# Patient Record
Sex: Male | Born: 1965 | Race: Black or African American | Hispanic: No | Marital: Married | Smoking: Never smoker
Health system: Southern US, Community
[De-identification: ages and names within clinical notes are randomized; demographics above are authoritative.]

## PROBLEM LIST (undated history)

## (undated) DIAGNOSIS — K219 Gastro-esophageal reflux disease without esophagitis: Secondary | ICD-10-CM

## (undated) DIAGNOSIS — I10 Essential (primary) hypertension: Secondary | ICD-10-CM

## (undated) DIAGNOSIS — E119 Type 2 diabetes mellitus without complications: Secondary | ICD-10-CM

## (undated) DIAGNOSIS — M1990 Unspecified osteoarthritis, unspecified site: Secondary | ICD-10-CM

## (undated) HISTORY — PX: CYST EXCISION: SHX5701

## (undated) HISTORY — PX: CHOLECYSTECTOMY: SHX55

## (undated) SURGERY — Surgical Case
Anesthesia: *Unknown

---

## 2005-11-27 ENCOUNTER — Encounter: Payer: Self-pay | Admitting: Internal Medicine

## 2005-11-27 ENCOUNTER — Ambulatory Visit: Payer: Self-pay | Admitting: Internal Medicine

## 2005-12-04 ENCOUNTER — Ambulatory Visit (HOSPITAL_COMMUNITY): Admission: RE | Admit: 2005-12-04 | Discharge: 2005-12-04 | Payer: Self-pay | Admitting: Internal Medicine

## 2005-12-04 ENCOUNTER — Ambulatory Visit: Payer: Self-pay | Admitting: Internal Medicine

## 2008-05-16 ENCOUNTER — Emergency Department (HOSPITAL_COMMUNITY): Admission: EM | Admit: 2008-05-16 | Discharge: 2008-05-16 | Payer: Self-pay | Admitting: Emergency Medicine

## 2008-08-19 ENCOUNTER — Encounter (INDEPENDENT_AMBULATORY_CARE_PROVIDER_SITE_OTHER): Payer: Self-pay | Admitting: General Surgery

## 2008-08-19 ENCOUNTER — Ambulatory Visit (HOSPITAL_COMMUNITY): Admission: RE | Admit: 2008-08-19 | Discharge: 2008-08-19 | Payer: Self-pay | Admitting: General Surgery

## 2011-02-19 NOTE — Op Note (Signed)
[name], Francisco Fox               ACCOUNT NO.:  [id number]   MEDICAL RECORD NO.:  [id number]          PATIENT TYPE:  AMB   LOCATION:  DAY                           FACILITY:  [facility]   PHYSICIAN:  Tilford Pillar, MD      DATE OF BIRTH:  05/10/1966   DATE OF PROCEDURE:  08/19/2008  DATE OF DISCHARGE:                               OPERATIVE REPORT   PREOPERATIVE DIAGNOSIS:  Right preauricular sebaceous cyst of the face.   POSTOPERATIVE DIAGNOSIS:  Right preauricular sebaceous cyst of the face.   PROCEDURE:  Excision of sebaceous cyst of the face via 1-cm incision.   SURGEON:  Tilford Pillar, MD   ANESTHESIA:  MAC anesthesia with local anesthetic 1% lidocaine with  epinephrine.   SPECIMEN:  Cyst.   ESTIMATED BLOOD LOSS:  Minimal.   INDICATIONS:  The patient is a 45 year old male who presented to my  office with a history of a nodule just in front of his right ear.  This  had been present for several months that had slowly increased in  symptomatology.  The risks, benefits, and alternatives of excision were  discussed at length with the patient.  The patient's questions and  concerns were addressed, and the patient was consented for the planned  procedure.   OPERATION:  The patient was taken to the operating room and placed in a  supine position on the operating room table at which time the sedation  was administered.  Once the patient was asleep, his right face was  prepped with DuraPrep solution.  Sterile drapes were placed.  Local  anesthetic was then instilled along the planned site of excision.  An  elliptical incision was created over the cystic nodule with a #15-blade  scalpel.  Additional dissection down through the subcuticular tissue was  carried out using electrocautery.  Needle-tipped electrocautery was  utilized to complete the dissection.  Once the cyst was freed, it was  placed on the back table and was sent as a permanent specimen to  pathology.  At this time,  hemostasis was excellent.  The wound was  irrigated.  A 4-0 Monocryl was utilized to reapproximate the skin edges  in a running subcuticular suture.  The skin was washed and dried with a  moist and dry towel.  Quarter inch Steri-Strips were placed after  benzoin was applied around the incision.  DuraPrep remover was then used  to clear the remaining skin.  The patient was allowed to come out of  sedation after the drapes  were removed.  The patient was transferred to the post anesthesia  recovery area in stable condition.  At the conclusion of procedure, all  instrument, sponge, and needle counts were correct.  The patient  tolerated procedure well.      Tilford Pillar, MD  Electronically Signed     BZ/MEDQ  D:  08/19/2008  T:  08/20/2008  Job:  [id number]   cc:   Dr. Phillips Odor

## 2011-02-19 NOTE — H&P (Signed)
Francisco Fox, Francisco Fox               ACCOUNT NO.:  [id number]   MEDICAL RECORD NO.:  [id number]          PATIENT TYPE:  AMB   LOCATION:  DAY                           FACILITY:  [facility]   PHYSICIAN:  Tilford Pillar, MD      DATE OF BIRTH:  15-Feb-1966   DATE OF ADMISSION:  DATE OF DISCHARGE:  LH                              HISTORY & PHYSICAL   CHIEF COMPLAINT:  Nodule on right face.   HISTORY OF PRESENT ILLNESS:  The patient is a 45 year old male who  presented to my office with approximately 1 week of increasing pain  along the right aspect of his face at the right temporal area.  He  states he had noted a nodule there over the last several years without  any significant problems.  Really, no significant change in size.  It  never caused him any problems until over the last couple of weeks.  He  noticed that increasing in size and having an increasing pain associated  with it.  He was started on some antibiotics.  He described as purulent  drainage from the area.  He denied any fever or chills.  No nausea and  vomiting.  Since initiating antibiotics, it has improved.  He is  currently completed with his antibiotics.  He feels somewhat tender in  this area.   PAST MEDICAL HISTORY:  None.   PAST SURGICAL HISTORY:  Previous laparoscopic cholecystectomy.   MEDICATIONS:  None.   ALLERGIES:  No known drug allergies.   SOCIAL HISTORY:  No tobacco, no alcohol use.  Occupation, he does work  for [employer] as a Hospital doctor.   PERTINENT FAMILY HISTORY:  Consistent with diabetes mellitus and  hypertension.   REVIEW OF SYSTEMS:  In all systems is unremarkable including  constitutional, eyes, ears, nose and throat, respiratory,  cardiovascular, gastrointestinal, genitourinary, musculoskeletal, skin,  endocrine and neuro other than HPI.   PHYSICAL EXAMINATION:  GENERAL:  The patient is an age-appropriate  healthy-appearing, obese male in no acute distress.  He is alert and  oriented x3.  HEENT: Scalp, no deformities, no masses.  Eyes: Pupils equal, round, and  reactive.  Extraocular movements were intact.  No scleral icterus or  conjunctival pallor is noted.  No diminished hearing is appreciated.  On  exam, oral mucosa is pink.  Normal occlusion.  NECK:  Trachea is  midline.  No cervical lymphadenopathy is apparent.  On palpation of the  face, he does have a palpable nodule on the right temporale area just  anterior to the ear on the right side.  This is slightly tender to  palpation.  It is mobile.  There is no discharge noted.  There is no  fluctuance associated with it.  PULMONARY:  Unlabored respirations.  He is clear to auscultation  bilaterally.  CARDIOVASCULAR:  Regular rate and rhythm.  No murmurs are  appreciated.  He has 2+ radial pulses bilaterally.  ABDOMEN:  Soft and nontender.  SKIN:  Warm and dry.   ASSESSMENT AND PLAN:  Sebaceous cyst of the right face.  At this  time, I  discussed with the patient to continue ibuprofen and pain medication for  comfort as well as warm compresses as needed versus ice packs that helps  with the symptomatology based on the location and the size.  I did  discuss the risks, benefits, and alternatives of cyst excision with risk  including, but not limited to risk of bleeding, infection, recurrence as  well as the possibility of local paresthesia around the area.  The  patient's questions and concerns were addressed and the patient wishes  to proceed with the planned excision.  This will be scheduled at his  earliest convenience.      Tilford Pillar, MD  Electronically Signed     BZ/MEDQ  D:  08/11/2008  T:  08/12/2008  Job:  [id number]   cc:   [facility]   Dr. Phillips Odor   Short-stay surgery

## 2011-02-22 NOTE — Op Note (Signed)
Francisco Fox, Francisco Fox               ACCOUNT NO.:  [id number]   MEDICAL RECORD NO.:  [id number]          PATIENT TYPE:  AMB   LOCATION:  DAY                           FACILITY:  [facility]   PHYSICIAN:  R. Roetta Sessions, M.D. DATE OF BIRTH:  04-06-1966   DATE OF PROCEDURE:  12/04/2005  DATE OF DISCHARGE:                                 OPERATIVE REPORT   PROCEDURE:  Esophagogastroduodenoscopy with gastric biopsy followed by  colonoscopy, diagnostic.   INDICATIONS FOR PROCEDURE:  The patient is a 45 year old gentleman with  burning, epigastric pain, painless hematochezia, history of known  hemorrhoids. EGD and colonoscopy are now being done. This approach has been  discussed with the patient at length. Potential risks, benefits, and  alternatives have been reviewed and questions answered. He is agreeable. It  is notable through [facility] office, CBC from November 12, 2005 came back entirely normal as did a Chem 20. Amylase and lipase were  also not elevated.   PROCEDURE NOTE:  O2 saturation, blood pressure, pulse, and respirations were  monitored throughout the entire procedure. Conscious sedation with Versed 4  mg IV and Demerol 75 mg IV in divided doses.   INSTRUMENT:  Olympus video chip system.   FINDINGS:  Examination of the tubular esophagus revealed a couple of distal  esophageal erosions. Esophageal mucosa otherwise appeared normal. EGD  junction was easily traversed.   Stomach:  Gastric cavity was empty and insufflated well with air. Thorough  examination of gastric mucosa including retroflexed view of the proximal  stomach and esophagogastric junction was undertaken. There was slight  nodularity on the lesser curvature of the mucosa really more on the distal  side of the annularis into the superior aspect of the antrum. Please see  photos. They do not appear to be an infiltrating process. There was no ulcer  erosion. Pylorus patent and easily traversed.  Examination of bulb and second  portion revealed no abnormalities.   THERAPEUTIC/DIAGNOSTIC MANEUVERS:  The mucosa just distal to the angularis,  superior aspect of the antrum was biopsied for histologic study. The patient  tolerated the procedure well and was prepared for colonoscopy. Digital  rectal exam revealed a couple of external anal papilla. Otherwise negative.   ENDOSCOPIC FINDINGS:  Prep was adequate.   Rectum:  Examination of the rectal mucosa including retroflexed view of the  anal verge and en face view of the anal canal demonstrated only anal canal  hemorrhoids and a couple of anal papilla. Rectal mucosa appeared normal.   Colon:  Colonic mucosa was surveyed from the rectosigmoid junction through  the left, transverse, and right colon to the area of the appendiceal  orifice, ileocecal valve, and cecum. These structures were well seen and  photographed for the record. From this level, the scope was slowly  withdrawn, and all previously mentioned mucosal surfaces were again seen.  The colonic mucosa appeared normal. The patient tolerated both procedures  well and was reactive to endoscopy.   IMPRESSION:  A  couple of distal esophageal erosions consistent with erosive  reflux esophagitis. Some nodularity of the  superior aspect of the antral  mucosa of uncertain significance, biopsied. Otherwise normal stomach. Normal  D1 and D2.   Colonoscopy findings:  Anal papilla and hemorrhoids, likely the source of  hematochezia. Otherwise normal rectal mucosa. Normal colonic mucosa.   RECOMMENDATIONS:  1.  Begin Aciphex 20 mg orally daily.  2.  Helicobacter pylori serologies, follow up on biopsies.  3.  Hemorrhoid literature provided to Francisco Fox. Ten-day course of Anusol      HC suppositories one per rectum at bedtime.  4.  I do not feel that today's findings would explain abdominal pain. Will      go ahead and proceed with a CT of the abdomen and pelvis with IV and      oral  contrast. Further recommendations to follow.      Jonathon Bellows, M.D.  Electronically Signed     RMR/MEDQ  D:  12/04/2005  T:  12/04/2005  Job:  [id number]   cc:   Patrica Duel, M.D.  Fax: [phone]

## 2011-02-22 NOTE — Consult Note (Signed)
NAMEBENARD, MINTURN               ACCOUNT NO.:  [id number]   MEDICAL RECORD NO.:  [id number]          PATIENT TYPE:  AMB   LOCATION:                                FACILITY:  [facility]   PHYSICIAN:  R. Roetta Sessions, M.D. DATE OF BIRTH:  1966-08-01   DATE OF CONSULTATION:  DATE OF DISCHARGE:                                   CONSULTATION   REASON FOR CONSULTATION:  Rectal bleeding.   HISTORY OF PRESENT ILLNESS:  Francisco Fox is a 45 year old obese African  American male who presents with a two month history of proctalgia and rectal  bleeding.  He describes the bleeding as moderate to large volume.  He has  seen bright red blood in his stool, in the water and on toilet paper.  He  also complains of abdominal pain with the bleeding.  Describes the pain  around the umbilicus as burning and knots.  Generally occurs just prior to  having a bowel movement.  He denies any melena.  He does have history of  hemorrhoids over the last two years.  He denies any NSAIDs or aspirin use.  He has had a long-standing history of constipation and can go up to a week  without a bowel movement.  He is not using any over-the-counter laxative.  He denies any heartburn, indigestion, dysphagia or odynophagia.  He does  have intermittent, chronic nausea, and denies any vomiting.  He is felt to  have hemoccult positive stool, Dr. Geanie Logan office.   PAST MEDICAL HISTORY:  Cholecystectomy approximately 8 years ago.   CURRENT MEDICATIONS:  1.  Metamucil  p.r.n.  2.  Anusol HC suppositories.   ALLERGIES:  No known drug allergies.   FAMILY HISTORY:  No known family history of irritable bowel disease,  colorectal carcinoma, liver or chronic GI problems.  Mother age 45, father  age 12.  Both have had kidney transplants.  Both have history of CVA and  diabetes mellitus.  He also has one brother who has had a kidney transplant  as well.   SOCIAL HISTORY:  Francisco Fox has been married for nine years.  He has three  children ages 26, 59 and 31 who are healthy and works full time with the  [employer].  Denies any tobacco or drug use.  He does  consume about three alcoholic beverages a month.   REVIEW OF SYSTEMS:  CONSTITUTIONAL:  Weight is steadily increasing.  Denies  any fatigue, fever or chills.  CARDIOVASCULAR:  Denies any chest pain or  palpitations. PULMONOLOGY:  Denies any cough, shortness of breath, dyspnea  or hemoptysis.  GI:  See HPI.   PHYSICAL EXAMINATION:  VITAL SIGNS:  Weight 227 pounds, height 66 inches,  temperature 98.3, blood pressure 130/80, pulse 74.  GENERAL APPEARANCE:  Mr.  Fox is a 45 year old well-developed, well-  nourished African American male who is alert, oriented, pleasant and  cooperative in no acute distress.  HEENT:  Sclerae are clear, nonicteric.  Conjunctivae are pink.  Oropharynx  pink and moist without any lesions.  NECK:  Supple without any mass  or thyromegaly.  CHEST:  Heart regular rate and rhythm with normal S1, S2 without any  murmurs, clicks, rubs or gallops.  LUNGS:  Clear to auscultation bilaterally.  ABDOMEN:  Protuberant with positive bowel sounds x4.  Abdomen is slightly  distended, nontender without any probable mass or hepatosplenomegaly.  No  rebound tenderness or guarding.  Exam was limited due to patient's body  habitus.  RECTAL:  Deferred.  EXTREMITIES:  Without clubbing or edema.  SKIN:  Warm and dry without any rash or jaundice.   IMPRESSION:  Francisco Fox is a 45 year old African American male with a two  month history of burning-type mid abdominal pain which is followed by  moderate to large volume rectal bleeding which is noted in the stool, toilet  paper and in the toilet water.  He does have history of hemorrhoids.  However, this does not explain his severe abdominal pain at the time.  He  may have ischemia, as his symptoms are brought on at work frequently.  However, they do happen at rest sometimes as well.  He  has intermittent  nausea and vomiting with his symptoms as well, and therefore, I feel we  should rule out or reevaluate his upper GI tract as well.  Although, his  symptoms are not typical of peptic ulcer disease.   He does have history of chronic constipation as well.   PLAN:  1.  Will schedule EGD and colonoscopy with Dr. Jena Gauss in the near future.  I      discussed both procedures including risks and benefits including but not      limited to bleeding, infection, perforation, drug reaction.  He agrees      with plan and consent will be obtained.  2.  Recommend stool softeners and daily Metamucil.  3.  He can use MiraLax 17 g daily if needed for constipation.  Give him      sufficient quantity for a month with one refill.  Further      recommendations pending procedures.   We would like to thank Dr. Nobie Putnam and Melony Overly, PA for allowing [place] to  participate in the care of Francisco Fox.      Nicholas Lose, N.P.      Jonathon Bellows, M.D.  Electronically Signed    KC/MEDQ  D:  11/27/2005  T:  11/28/2005  Job:  [id number]   cc:   Patrica Duel, M.D.  Fax: [phone]

## 2011-07-09 LAB — BASIC METABOLIC PANEL
BUN: 15
CO2: 28
Chloride: 102
Glucose, Bld: 115 — ABNORMAL HIGH
Potassium: 4.1

## 2011-07-09 LAB — CBC
HCT: 44.5
MCHC: 33.5
MCV: 89.7
Platelets: 213
RDW: 12.7
WBC: 4

## 2012-02-02 ENCOUNTER — Emergency Department (HOSPITAL_COMMUNITY)
Admission: EM | Admit: 2012-02-02 | Discharge: 2012-02-02 | Disposition: A | Discharge status: Home / Self Care | Payer: BC Managed Care – PPO | Attending: Emergency Medicine | Admitting: Emergency Medicine

## 2012-02-02 ENCOUNTER — Encounter (HOSPITAL_COMMUNITY): Payer: Self-pay

## 2012-02-02 DIAGNOSIS — W261XXA Contact with sword or dagger, initial encounter: Secondary | ICD-10-CM | POA: Insufficient documentation

## 2012-02-02 DIAGNOSIS — S61409A Unspecified open wound of unspecified hand, initial encounter: Secondary | ICD-10-CM | POA: Insufficient documentation

## 2012-02-02 DIAGNOSIS — W260XXA Contact with knife, initial encounter: Secondary | ICD-10-CM | POA: Insufficient documentation

## 2012-02-02 DIAGNOSIS — S61419A Laceration without foreign body of unspecified hand, initial encounter: Secondary | ICD-10-CM

## 2012-02-02 MED ORDER — LIDOCAINE HCL (PF) 1 % IJ SOLN
INTRAMUSCULAR | Status: AC
  Administered 2012-02-02: 5 mL
  Filled 2012-02-02: qty 5

## 2012-02-02 MED ORDER — HYDROCODONE-ACETAMINOPHEN 5-325 MG PO TABS
2.0000 | ORAL_TABLET | Freq: Once | ORAL | Status: AC
  Administered 2012-02-02: 2 via ORAL
  Filled 2012-02-02: qty 2

## 2012-02-02 MED ORDER — LIDOCAINE HCL (PF) 1 % IJ SOLN
5.0000 mL | Freq: Once | INTRAMUSCULAR | Status: AC
  Administered 2012-02-02: 5 mL

## 2012-02-02 MED ORDER — IBUPROFEN 800 MG PO TABS
800.0000 mg | ORAL_TABLET | Freq: Once | ORAL | Status: AC
  Administered 2012-02-02: 800 mg via ORAL
  Filled 2012-02-02: qty 1

## 2012-02-02 MED ORDER — ONDANSETRON HCL 4 MG PO TABS
4.0000 mg | ORAL_TABLET | Freq: Once | ORAL | Status: AC
  Administered 2012-02-02: 4 mg via ORAL
  Filled 2012-02-02: qty 1

## 2012-02-02 MED ORDER — HYDROCODONE-ACETAMINOPHEN 5-500 MG PO TABS: 1.0000 | ORAL_TABLET | ORAL | Status: AC | PRN

## 2012-02-02 MED ORDER — DIPHTH-ACELL PERTUSSIS-TETANUS 25-58-10 LF-MCG/0.5 IM SUSP
0.5000 mL | Freq: Once | INTRAMUSCULAR | Status: AC
  Administered 2012-02-02: 0.5 mL via INTRAMUSCULAR
  Filled 2012-02-02: qty 0.5

## 2012-02-02 NOTE — ED Notes (Signed)
Pt presents with laceration to left hand in web between thumb and index finger. Bleeding controlled.

## 2012-02-02 NOTE — Discharge Instructions (Signed)
Please keep laceration clean and dry. Have staples removed in 10 days. Return sooner if any signs of infection.Stitches, Staples, or Skin Adhesive Strips  Stitches (sutures), staples, and skin adhesive strips hold the skin together as it heals. They will usually be in place for 7 days or less. HOME CARE  Wash your hands with soap and water before and after you touch your wound.   Only take medicine as told by your doctor.   Cover your wound only if your doctor told you to. Otherwise, leave it open to air.   Do not get your stitches wet or dirty. If they get dirty, dab them gently with a clean washcloth. Wet the washcloth with soapy water. Do not rub. Pat them dry gently.   Do not put medicine or medicated cream on your stitches unless your doctor told you to.   Do not take out your own stitches or staples. Skin adhesive strips will fall off by themselves.   Do not pick at the wound. Picking can cause an infection.   Do not miss your follow-up appointment.   If you have problems or questions, call your doctor.  GET HELP RIGHT AWAY IF:   You have a temperature by mouth above 102 F (38.9 C), not controlled by medicine.   You have chills.   You have redness or pain around your stitches.   There is puffiness (swelling) around your stitches.   You notice fluid (drainage) from your stitches.   There is a bad smell coming from your wound.  MAKE SURE YOU:  Understand these instructions.   Will watch your condition.   Will get help if you are not doing well or get worse.  Document Released: 07/21/2009 Document Revised: 09/12/2011 Document Reviewed: 07/21/2009 [facility] Patient Information 2012 [place], [place].

## 2012-02-02 NOTE — ED Provider Notes (Signed)
Medical screening examination/treatment/procedure(s) were performed by non-physician practitioner and as supervising physician I was immediately available for consultation/collaboration.   Carleene Cooper III, MD 02/02/12 2113

## 2012-02-02 NOTE — ED Notes (Signed)
Pt a/ox4. Resp even and unlabored. NAD at this time. D/ C instructions reviewed with pt. Pt verbalized understanding. Pt ambulated to d/c desk with steady gate.  

## 2012-02-02 NOTE — ED Provider Notes (Signed)
History     CSN: [id number]  Arrival date & time 02/02/12  1113   First MD Initiated Contact with Patient 02/02/12 1123      Chief Complaint  Patient presents with  . Laceration    (Consider location/radiation/quality/duration/timing/severity/associated sxs/prior treatment) Patient is a 46 y.o. male presenting with skin laceration. The history is provided by the patient.  Laceration  The incident occurred 1 to 2 hours ago. The laceration is located on the left hand. The laceration is 1 cm in size. The laceration mechanism was a a clean knife. The pain is moderate. He reports no foreign bodies present. His tetanus status is out of date.    History reviewed. No pertinent past medical history.  Past Surgical History  Procedure Date  . Cholecystectomy     No family history on file.  History  Substance Use Topics  . Smoking status: Never Smoker   . Smokeless tobacco: Not on file  . Alcohol Use: Yes     occasional      Review of Systems  Constitutional: Negative for activity change.       All ROS Neg except as noted in HPI  HENT: Negative for nosebleeds and neck pain.   Eyes: Negative for photophobia and discharge.  Respiratory: Negative for cough, shortness of breath and wheezing.   Cardiovascular: Negative for chest pain and palpitations.  Gastrointestinal: Negative for abdominal pain and blood in stool.  Genitourinary: Negative for dysuria, frequency and hematuria.  Musculoskeletal: Negative for back pain and arthralgias.  Skin: Negative.   Neurological: Negative for dizziness, seizures and speech difficulty.  Psychiatric/Behavioral: Negative for hallucinations and confusion.    Allergies  Review of patient's allergies indicates no known allergies.  Home Medications  No current outpatient prescriptions on file.  BP 121/87  Pulse 80  Temp(Src) 97.9 F (36.6 C) (Oral)  Resp 16  Ht 5\' 4"  (1.626 m)  Wt 250 lb (113.399 kg)  BMI 42.91 kg/m2  SpO2  100%  Physical Exam  Nursing note and vitals reviewed. Constitutional: He is oriented to person, place, and time. He appears well-developed and well-nourished.  Non-toxic appearance.  HENT:  Head: Normocephalic.  Right Ear: Tympanic membrane and external ear normal.  Left Ear: Tympanic membrane and external ear normal.  Eyes: EOM and lids are normal. Pupils are equal, round, and reactive to light.  Neck: Normal range of motion. Neck supple. Carotid bruit is not present.  Cardiovascular: Normal rate, regular rhythm, normal heart sounds, intact distal pulses and normal pulses.   Pulmonary/Chest: Breath sounds normal. No respiratory distress.  Abdominal: Soft. Bowel sounds are normal. There is no tenderness. There is no guarding.  Musculoskeletal: Normal range of motion.       Laceration/puncture  of the web space between left 1st and 2nd fingers.  Lymphadenopathy:       Head (right side): No submandibular adenopathy present.       Head (left side): No submandibular adenopathy present.    He has no cervical adenopathy.  Neurological: He is alert and oriented to person, place, and time. He has normal strength. No cranial nerve deficit or sensory deficit.       No sensory/motor deficits  Skin: Skin is warm and dry.  Psychiatric: He has a normal mood and affect. His speech is normal.    ED Course  LACERATION REPAIR Date/Time: 02/02/2012 12:18 PM Performed by: Kathie Dike Authorized by: Kathie Dike Consent: Verbal consent obtained. Risks and benefits: risks, benefits  and alternatives were discussed Consent given by: patient Patient understanding: patient states understanding of the procedure being performed Patient identity confirmed: verbally with patient Time out: Immediately prior to procedure a "time out" was called to verify the correct patient, procedure, equipment, support staff and site/side marked as required. Body area: upper extremity Location details: left  hand Laceration length: 1.4 cm Foreign bodies: no foreign bodies Tendon involvement: none Nerve involvement: none Vascular damage: no Anesthesia: local infiltration Local anesthetic: lidocaine 1% without epinephrine Patient sedated: no Irrigation solution: saline Amount of cleaning: standard Debridement: none Skin closure: staples Number of sutures: 4 Approximation: close Approximation difficulty: simple Dressing: 4x4 sterile gauze Patient tolerance: Patient tolerated the procedure well with no immediate complications. Comments: Sterile dressing applied by me.    Labs Reviewed - No data to display No results found.   1. Laceration of hand       MDM  I have reviewed nursing notes, vital signs, and all appropriate lab and imaging results for this patient.   1.4cm laceration/puncture wound to the 1st to 2nd  web space of the left hand. Wound repaired without problem. Tetanus given .     Kathie Dike, [place] 02/02/12 1223

## 2012-02-02 NOTE — ED Notes (Signed)
Pt with small laceration to left hand in webbing between thumb and index finger. Bleeding controlled at this time. Pt is unsure of Tetanus status.

## 2012-02-11 ENCOUNTER — Ambulatory Visit (HOSPITAL_COMMUNITY)
Admission: RE | Admit: 2012-02-11 | Discharge: 2012-02-11 | Disposition: A | Discharge status: Home / Self Care | Payer: BC Managed Care – PPO | Source: Ambulatory Visit | Attending: Family Medicine | Admitting: Family Medicine

## 2012-02-11 ENCOUNTER — Other Ambulatory Visit (HOSPITAL_COMMUNITY): Payer: Self-pay | Admitting: Family Medicine

## 2012-02-11 DIAGNOSIS — R059 Cough, unspecified: Secondary | ICD-10-CM | POA: Insufficient documentation

## 2012-02-11 DIAGNOSIS — R05 Cough: Secondary | ICD-10-CM | POA: Insufficient documentation

## 2012-03-05 ENCOUNTER — Emergency Department (HOSPITAL_COMMUNITY): Payer: BC Managed Care – PPO

## 2012-03-05 ENCOUNTER — Emergency Department (HOSPITAL_COMMUNITY)
Admission: EM | Admit: 2012-03-05 | Discharge: 2012-03-06 | Disposition: A | Discharge status: Home / Self Care | Payer: BC Managed Care – PPO | Attending: Emergency Medicine | Admitting: Emergency Medicine

## 2012-03-05 ENCOUNTER — Encounter (HOSPITAL_COMMUNITY): Payer: Self-pay | Admitting: *Deleted

## 2012-03-05 DIAGNOSIS — R05 Cough: Secondary | ICD-10-CM

## 2012-03-05 DIAGNOSIS — R059 Cough, unspecified: Secondary | ICD-10-CM | POA: Insufficient documentation

## 2012-03-05 DIAGNOSIS — M791 Myalgia, unspecified site: Secondary | ICD-10-CM

## 2012-03-05 DIAGNOSIS — IMO0001 Reserved for inherently not codable concepts without codable children: Secondary | ICD-10-CM | POA: Insufficient documentation

## 2012-03-05 NOTE — ED Notes (Signed)
Pt has had body aches, fever, and congestion x2 days. Pt has not taken anything for symptoms today.

## 2012-03-06 MED ORDER — HYDROCOD POLST-CHLORPHEN POLST 10-8 MG/5ML PO LQCR
5.0000 mL | Freq: Once | ORAL | Status: AC
  Administered 2012-03-06: 5 mL via ORAL
  Filled 2012-03-06: qty 5

## 2012-03-06 MED ORDER — GUAIFENESIN-CODEINE 100-10 MG/5ML PO SYRP: ORAL_SOLUTION | ORAL | Status: DC

## 2012-03-06 NOTE — ED Provider Notes (Signed)
History     CSN: [id number]  Arrival date & time 03/05/12  2148   First MD Initiated Contact with Patient 03/05/12 2314      Chief Complaint  Patient presents with  . Generalized Body Aches  . Nasal Congestion  . Fever    (Consider location/radiation/quality/duration/timing/severity/associated sxs/prior treatment) HPI Comments: NP cough, nasal congestion, fever of 101 earlier today and myalgias.  No earache or sore throat.  No n/v/d.  Patient is a 46 y.o. male presenting with fever. The history is provided by the patient. No language interpreter was used.  Fever Primary symptoms of the febrile illness include fever, cough and myalgias. Primary symptoms do not include wheezing, shortness of breath, nausea, vomiting or diarrhea. The current episode started 2 days ago. This is a new problem. The problem has not changed since onset.   History reviewed. No pertinent past medical history.  Past Surgical History  Procedure Date  . Cholecystectomy     History reviewed. No pertinent family history.  History  Substance Use Topics  . Smoking status: Never Smoker   . Smokeless tobacco: Not on file  . Alcohol Use: Yes     occasional      Review of Systems  Constitutional: Positive for fever. Negative for chills.  HENT: Positive for congestion. Negative for ear pain and sore throat.   Respiratory: Positive for cough. Negative for shortness of breath and wheezing.   Gastrointestinal: Negative for nausea, vomiting and diarrhea.  Musculoskeletal: Positive for myalgias.  All other systems reviewed and are negative.    Allergies  Review of patient's allergies indicates no known allergies.  Home Medications   Current Outpatient Rx  Name Route Sig Dispense Refill  . GUAIFENESIN-CODEINE 100-10 MG/5ML PO SYRP  Take 10 mls po q 4-6 hrs prn cough 240 mL 0    BP 113/90  Pulse 119  Temp(Src) 99.1 F (37.3 C) (Oral)  Resp 16  Ht 5\' 4"  (1.626 m)  Wt 250 lb (113.399 kg)  BMI  42.91 kg/m2  SpO2 98%  Physical Exam  Nursing note and vitals reviewed. Constitutional: He is oriented to person, place, and time. He appears well-developed and well-nourished.  HENT:  Head: Normocephalic and atraumatic.  Right Ear: External ear normal.  Left Ear: External ear normal.  Nose: Nose normal.  Mouth/Throat: Oropharynx is clear and moist. No oropharyngeal exudate.  Eyes: EOM are normal.  Neck: Normal range of motion.  Cardiovascular: Normal rate, regular rhythm, normal heart sounds and intact distal pulses.   Pulmonary/Chest: Effort normal and breath sounds normal. No accessory muscle usage. Not tachypneic. No respiratory distress. He has no decreased breath sounds. He has no wheezes. He has no rhonchi. He has no rales. He exhibits no tenderness.  Abdominal: Soft. He exhibits no distension. There is no tenderness.  Musculoskeletal: Normal range of motion.  Neurological: He is alert and oriented to person, place, and time.  Skin: Skin is warm and dry.  Psychiatric: He has a normal mood and affect. Judgment normal.    ED Course  Procedures (including critical care time)  Labs Reviewed - No data to display Dg Chest 2 View  03/06/2012  *RADIOLOGY REPORT*  Clinical Data: Body aches with fever and congestion for 2 days  CHEST - 2 VIEW  Comparison: 02/11/2012  Findings: Normal heart size with clear lung fields.  No bony abnormality.  No change from priors.  IMPRESSION: Negative.  Original Report Authenticated By: Elsie Stain, M.D.  1. Cough   2. Myalgia       MDM  No PNA. rx-robitussin AC, 240 ml otc ibuprofen 800 mg TID.        Worthy Rancher, PA 03/06/12 [phone]

## 2012-03-06 NOTE — ED Notes (Signed)
Discharge instructions reviewed with pt, questions answered. Pt verbalized understanding.  

## 2012-03-06 NOTE — Discharge Instructions (Signed)
Cough, Adult  A cough is a reflex that helps clear your throat and airways. It can help heal the body or may be a reaction to an irritated airway. A cough may only last 2 or 3 weeks (acute) or may last more than 8 weeks (chronic).  CAUSES Acute cough:  Viral or bacterial infections.  Chronic cough:  Infections.   Allergies.   Asthma.   Post-nasal drip.   Smoking.   Heartburn or acid reflux.   Some medicines.   Chronic lung problems (COPD).   Cancer.  SYMPTOMS   Cough.   Fever.   Chest pain.   Increased breathing rate.   High-pitched whistling sound when breathing (wheezing).   Colored mucus that you cough up (sputum).  TREATMENT   A bacterial cough may be treated with antibiotic medicine.   A viral cough must run its course and will not respond to antibiotics.   Your caregiver may recommend other treatments if you have a chronic cough.  HOME CARE INSTRUCTIONS   Only take over-the-counter or prescription medicines for pain, discomfort, or fever as directed by your caregiver. Use cough suppressants only as directed by your caregiver.   Use a cold steam vaporizer or humidifier in your bedroom or home to help loosen secretions.   Sleep in a semi-upright position if your cough is worse at night.   Rest as needed.   Stop smoking if you smoke.  SEEK IMMEDIATE MEDICAL CARE IF:   You have pus in your sputum.   Your cough starts to worsen.   You cannot control your cough with suppressants and are losing sleep.   You begin coughing up blood.   You have difficulty breathing.   You develop pain which is getting worse or is uncontrolled with medicine.   You have a fever.  MAKE SURE YOU:   Understand these instructions.   Will watch your condition.   Will get help right away if you are not doing well or get worse.  Document Released: 03/22/2011 Document Revised: 09/12/2011 Document Reviewed: 03/22/2011 [facility] Patient Information 2012 [place],  [place].   The chest x-ray shows  No signs of pneumonia.  Take the cough medicine as directed.  Take ibuprofen 800 mg every  8 hrs with food.  Follow up with your MD as needed.

## 2012-03-06 NOTE — ED Provider Notes (Signed)
Medical screening examination/treatment/procedure(s) were performed by non-physician practitioner and as supervising physician I was immediately available for consultation/collaboration.   Benny Lennert, MD 03/06/12 [phone]

## 2012-05-03 ENCOUNTER — Emergency Department (HOSPITAL_COMMUNITY): Payer: BC Managed Care – PPO

## 2012-05-03 ENCOUNTER — Encounter (HOSPITAL_COMMUNITY): Payer: Self-pay | Admitting: *Deleted

## 2012-05-03 ENCOUNTER — Emergency Department (HOSPITAL_COMMUNITY)
Admission: EM | Admit: 2012-05-03 | Discharge: 2012-05-03 | Disposition: A | Discharge status: Home / Self Care | Payer: BC Managed Care – PPO | Attending: Emergency Medicine | Admitting: Emergency Medicine

## 2012-05-03 DIAGNOSIS — R109 Unspecified abdominal pain: Secondary | ICD-10-CM | POA: Insufficient documentation

## 2012-05-03 DIAGNOSIS — M25519 Pain in unspecified shoulder: Secondary | ICD-10-CM | POA: Insufficient documentation

## 2012-05-03 DIAGNOSIS — R079 Chest pain, unspecified: Secondary | ICD-10-CM | POA: Insufficient documentation

## 2012-05-03 DIAGNOSIS — S2239XA Fracture of one rib, unspecified side, initial encounter for closed fracture: Secondary | ICD-10-CM | POA: Insufficient documentation

## 2012-05-03 LAB — CBC WITH DIFFERENTIAL/PLATELET
Basophils Relative: 0 % (ref 0–1)
Eosinophils Relative: 2 % (ref 0–5)
HCT: 42.7 % (ref 39.0–52.0)
Hemoglobin: 14.3 g/dL (ref 13.0–17.0)
Lymphs Abs: 1.8 10*3/uL (ref 0.7–4.0)
MCH: 29.4 pg (ref 26.0–34.0)
MCV: 87.7 fL (ref 78.0–100.0)
Monocytes Relative: 7 % (ref 3–12)
Neutro Abs: 5 10*3/uL (ref 1.7–7.7)
RBC: 4.87 MIL/uL (ref 4.22–5.81)

## 2012-05-03 LAB — COMPREHENSIVE METABOLIC PANEL
Albumin: 4.4 g/dL (ref 3.5–5.2)
Alkaline Phosphatase: 67 U/L (ref 39–117)
BUN: 13 mg/dL (ref 6–23)
CO2: 29 mEq/L (ref 19–32)
Chloride: 99 mEq/L (ref 96–112)
GFR calc non Af Amer: 83 mL/min — ABNORMAL LOW (ref >90)
Glucose, Bld: 167 mg/dL — ABNORMAL HIGH (ref 70–99)
Potassium: 4.7 mEq/L (ref 3.5–5.1)
Total Bilirubin: 0.6 mg/dL (ref 0.3–1.2)

## 2012-05-03 LAB — PROTIME-INR: Prothrombin Time: 14.6 seconds (ref 11.6–15.2)

## 2012-05-03 MED ORDER — IBUPROFEN 800 MG PO TABS: 800.0000 mg | ORAL_TABLET | Freq: Three times a day (TID) | ORAL | Status: AC

## 2012-05-03 MED ORDER — HYDROMORPHONE HCL PF 1 MG/ML IJ SOLN
1.0000 mg | Freq: Once | INTRAMUSCULAR | Status: AC
  Administered 2012-05-03: 1 mg via INTRAVENOUS
  Filled 2012-05-03: qty 1

## 2012-05-03 MED ORDER — FENTANYL CITRATE 0.05 MG/ML IJ SOLN
50.0000 ug | Freq: Once | INTRAMUSCULAR | Status: AC
  Administered 2012-05-03: 50 ug via INTRAVENOUS
  Filled 2012-05-03: qty 2

## 2012-05-03 MED ORDER — ONDANSETRON HCL 4 MG/2ML IJ SOLN
4.0000 mg | Freq: Once | INTRAMUSCULAR | Status: AC
  Administered 2012-05-03: 4 mg via INTRAVENOUS
  Filled 2012-05-03: qty 2

## 2012-05-03 MED ORDER — IOHEXOL 300 MG/ML  SOLN
100.0000 mL | Freq: Once | INTRAMUSCULAR | Status: AC | PRN
  Administered 2012-05-03: 100 mL via INTRAVENOUS

## 2012-05-03 MED ORDER — OXYCODONE-ACETAMINOPHEN 5-325 MG PO TABS: 2.0000 | ORAL_TABLET | ORAL | Status: AC | PRN

## 2012-05-03 MED ORDER — KETOROLAC TROMETHAMINE 30 MG/ML IJ SOLN
30.0000 mg | Freq: Once | INTRAMUSCULAR | Status: AC
  Administered 2012-05-03: 30 mg via INTRAVENOUS
  Filled 2012-05-03: qty 1
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
## 2012-05-08 ENCOUNTER — Other Ambulatory Visit (HOSPITAL_COMMUNITY): Payer: Self-pay | Admitting: Family Medicine

## 2012-05-08 ENCOUNTER — Ambulatory Visit (HOSPITAL_COMMUNITY)
Admission: RE | Admit: 2012-05-08 | Discharge: 2012-05-08 | Disposition: A | Discharge status: Home / Self Care | Payer: BC Managed Care – PPO | Source: Ambulatory Visit | Attending: Family Medicine | Admitting: Family Medicine

## 2012-05-08 DIAGNOSIS — X58XXXA Exposure to other specified factors, initial encounter: Secondary | ICD-10-CM | POA: Insufficient documentation

## 2012-05-08 DIAGNOSIS — S2239XA Fracture of one rib, unspecified side, initial encounter for closed fracture: Secondary | ICD-10-CM

## 2013-04-21 SURGERY — SIGMOIDOSCOPY, FLEXIBLE
Anesthesia: Moderate Sedation

## 2014-11-17 ENCOUNTER — Ambulatory Visit (HOSPITAL_COMMUNITY)
Admit: 2014-11-17 | Discharge: 2014-11-17 | Disposition: A | Discharge status: Home / Self Care | Payer: BC Managed Care – PPO | Source: Ambulatory Visit | Attending: Emergency Medicine | Admitting: Emergency Medicine

## 2014-11-17 ENCOUNTER — Other Ambulatory Visit (HOSPITAL_COMMUNITY): Payer: Self-pay | Admitting: Emergency Medicine

## 2014-11-17 ENCOUNTER — Emergency Department (HOSPITAL_COMMUNITY)
Admission: EM | Admit: 2014-11-17 | Discharge: 2014-11-17 | Disposition: A | Discharge status: Home / Self Care | Payer: BC Managed Care – PPO | Attending: Emergency Medicine | Admitting: Emergency Medicine

## 2014-11-17 ENCOUNTER — Encounter (HOSPITAL_COMMUNITY): Payer: Self-pay | Admitting: Emergency Medicine

## 2014-11-17 ENCOUNTER — Emergency Department (HOSPITAL_COMMUNITY): Payer: BC Managed Care – PPO

## 2014-11-17 DIAGNOSIS — M79604 Pain in right leg: Secondary | ICD-10-CM | POA: Diagnosis not present

## 2014-11-17 DIAGNOSIS — Z79899 Other long term (current) drug therapy: Secondary | ICD-10-CM | POA: Insufficient documentation

## 2014-11-17 DIAGNOSIS — M25561 Pain in right knee: Secondary | ICD-10-CM | POA: Insufficient documentation

## 2014-11-17 NOTE — ED Notes (Signed)
Pt c/o rt knee pain but denies any injury and c/o finger tip numbness.

## 2014-11-17 NOTE — ED Provider Notes (Signed)
CSN: [id number]     Arrival date & time 11/17/14  0023 History  This chart was scribed for Geoffery Lyonsouglas Dawnette Mione, MD by Richarda Overlieichard Holland, ED Scribe. This patient was seen in room APA05/APA05 and the patient's care was started 12:50 AM.    Chief Complaint  Patient presents with  . Knee Pain   The history is provided by the patient. No language interpreter was used.   HPI Comments: Francisco Fox is a 49 y.o. male who presents to the Emergency Department complaining of sudden right knee pain that started yesterday. Pt reports no injuries or falls. He says he was just sitting down when the knee pain started. Pt reports no previous injuries to the knee or problems. He says that it hurts with certain movements. Pt reports no pertinent past medical history at this time. He reports NKDA.   History reviewed. No pertinent past medical history. Past Surgical History  Procedure Laterality Date  . Cholecystectomy     History reviewed. No pertinent family history. History  Substance Use Topics  . Smoking status: Never Smoker   . Smokeless tobacco: Not on file  . Alcohol Use: Yes     Comment: occasional    Review of Systems  Musculoskeletal: Positive for arthralgias.  All other systems reviewed and are negative.   Allergies  Review of patient's allergies indicates no known allergies.  Home Medications   Prior to Admission medications   Medication Sig Start Date End Date Taking? Authorizing Provider  ranitidine (ZANTAC) 150 MG tablet Take 150 mg by mouth 2 (two) times daily.   Yes Historical Provider, MD  omeprazole (PRILOSEC) 40 MG capsule Take 40 mg by mouth daily.    Historical Provider, MD   BP 137/99 mmHg  Pulse 88  Temp(Src) 98.4 F (36.9 C) (Oral)  Resp 17  Ht 5\' 5"  (1.651 m)  Wt 250 lb (113.399 kg)  BMI 41.60 kg/m2  SpO2 97% Physical Exam  Constitutional: He is oriented to person, place, and time. He appears well-developed and well-nourished.  HENT:  Head: Normocephalic and  atraumatic.  Eyes: Right eye exhibits no discharge. Left eye exhibits no discharge.  Neck: Neck supple. No tracheal deviation present.  Cardiovascular: Normal rate.   Pulmonary/Chest: No respiratory distress.  Abdominal: He exhibits no distension.  Musculoskeletal:  The right knee appears grossly normal. No effusion. Good ROM without crepitus. Stable AP/lateral. Tenderness to the posterior aspect of the knee. No palpable cord. Distal pulses motor and sensory intact.   Neurological: He is alert and oriented to person, place, and time.  Skin: Skin is warm and dry.  Psychiatric: He has a normal mood and affect.  Nursing note and vitals reviewed.   ED Course  Procedures   DIAGNOSTIC STUDIES: Oxygen Saturation is 97% on RA, normal by my interpretation.    COORDINATION OF CARE: 12:54 AM Discussed treatment plan with pt at bedside and pt agreed to plan.   Labs Review Labs Reviewed - No data to display  Imaging Review No results found.   EKG Interpretation None      MDM   Final diagnoses:  None    Patient is a 49 year old male with no significant past medical history. He presents with complaints of knee pain in the absence of any injury or trauma. 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 His x-rays reveal no bony abnormalities in his physical examination is essentially unremarkable. He is tender over the posterior  knee and upper calf. He will return tomorrow for an ultrasound to rule out DVT or Baker's cyst. In the meantime I will recommend anti-inflammatories.   I personally performed the services described in this documentation, which was scribed in my presence. The recorded information has been reviewed and is accurate.      Geoffery Lyons, MD 11/17/14 [phone]

## 2014-11-17 NOTE — Discharge Instructions (Signed)
Ibuprofen 600 mg 3 times daily for the next 5 days.  Return tomorrow with the given time for an ultrasound of your leg to rule out a blood clot.   Knee Pain The knee is the complex joint between your thigh and your lower leg. It is made up of bones, tendons, ligaments, and cartilage. The bones that make up the knee are:  The femur in the thigh.  The tibia and fibula in the lower leg.  The patella or kneecap riding in the groove on the lower femur. CAUSES  Knee pain is a common complaint with many causes. A few of these causes are:  Injury, such as:  A ruptured ligament or tendon injury.  Torn cartilage.  Medical conditions, such as:  Gout  Arthritis  Infections  Overuse, over training, or overdoing a physical activity. Knee pain can be minor or severe. Knee pain can accompany debilitating injury. Minor knee problems often respond well to self-care measures or get well on their own. More serious injuries may need medical intervention or even surgery. SYMPTOMS The knee is complex. Symptoms of knee problems can vary widely. Some of the problems are:  Pain with movement and weight bearing.  Swelling and tenderness.  Buckling of the knee.  Inability to straighten or extend your knee.  Your knee locks and you cannot straighten it.  Warmth and redness with pain and fever.  Deformity or dislocation of the kneecap. DIAGNOSIS  Determining what is wrong may be very straight forward such as when there is an injury. It can also be challenging because of the complexity of the knee. Tests to make a diagnosis may include:  Your caregiver taking a history and doing a physical exam.  Routine X-rays can be used to rule out other problems. X-rays will not reveal a cartilage tear. Some injuries of the knee can be diagnosed by:  Arthroscopy a surgical technique by which a small video camera is inserted through tiny incisions on the sides of the knee. This procedure is used to examine  and repair internal knee joint problems. Tiny instruments can be used during arthroscopy to repair the torn knee cartilage (meniscus).  Arthrography is a radiology technique. A contrast liquid is directly injected into the knee joint. Internal structures of the knee joint then become visible on X-ray film.  An MRI scan is a non X-ray radiology procedure in which magnetic fields and a computer produce two- or three-dimensional images of the inside of the knee. Cartilage tears are often visible using an MRI scanner. MRI scans have largely replaced arthrography in diagnosing cartilage tears of the knee.  Blood work.  Examination of the fluid that helps to lubricate the knee joint (synovial fluid). This is done by taking a sample out using a needle and a syringe. TREATMENT The treatment of knee problems depends on the cause. Some of these treatments are:  Depending on the injury, proper casting, splinting, surgery, or physical therapy care will be needed.  Give yourself adequate recovery time. Do not overuse your joints. If you begin to get sore during workout routines, back off. Slow down or do fewer repetitions.  For repetitive activities such as cycling or running, maintain your strength and nutrition.  Alternate muscle groups. For example, if you are a weight lifter, work the upper body on one day and the lower body the next.  Either tight or weak muscles do not give the proper support for your knee. Tight or weak muscles do not absorb  the stress placed on the knee joint. Keep the muscles surrounding the knee strong.  Take care of mechanical problems.  If you have flat feet, orthotics or special shoes may help. See your caregiver if you need help.  Arch supports, sometimes with wedges on the inner or outer aspect of the heel, can help. These can shift pressure away from the side of the knee most bothered by osteoarthritis.  A brace called an "unloader" brace also may be used to help ease  the pressure on the most arthritic side of the knee.  If your caregiver has prescribed crutches, braces, wraps or ice, use as directed. The acronym for this is PRICE. This means protection, rest, ice, compression, and elevation.  Nonsteroidal anti-inflammatory drugs (NSAIDs), can help relieve pain. But if taken immediately after an injury, they may actually increase swelling. Take NSAIDs with food in your stomach. Stop them if you develop stomach problems. Do not take these if you have a history of ulcers, stomach pain, or bleeding from the bowel. Do not take without your caregiver's approval if you have problems with fluid retention, heart failure, or kidney problems.  For ongoing knee problems, physical therapy may be helpful.  Glucosamine and chondroitin are over-the-counter dietary supplements. Both may help relieve the pain of osteoarthritis in the knee. These medicines are different from the usual anti-inflammatory drugs. Glucosamine may decrease the rate of cartilage destruction.  Injections of a corticosteroid drug into your knee joint may help reduce the symptoms of an arthritis flare-up. They may provide pain relief that lasts a few months. You may have to wait a few months between injections. The injections do have a small increased risk of infection, water retention, and elevated blood sugar levels.  Hyaluronic acid injected into damaged joints may ease pain and provide lubrication. These injections may work by reducing inflammation. A series of shots may give relief for as long as 6 months.  Topical painkillers. Applying certain ointments to your skin may help relieve the pain and stiffness of osteoarthritis. Ask your pharmacist for suggestions. Many over the-counter products are approved for temporary relief of arthritis pain.  In some countries, doctors often prescribe topical NSAIDs for relief of chronic conditions such as arthritis and tendinitis. A review of treatment with NSAID  creams found that they worked as well as oral medications but without the serious side effects. PREVENTION  Maintain a healthy weight. Extra pounds put more strain on your joints.  Get strong, stay limber. Weak muscles are a common cause of knee injuries. Stretching is important. Include flexibility exercises in your workouts.  Be smart about exercise. If you have osteoarthritis, chronic knee pain or recurring injuries, you may need to change the way you exercise. This does not mean you have to stop being active. If your knees ache after jogging or playing basketball, consider switching to swimming, water aerobics, or other low-impact activities, at least for a few days a week. Sometimes limiting high-impact activities will provide relief.  Make sure your shoes fit well. Choose footwear that is right for your sport.  Protect your knees. Use the proper gear for knee-sensitive activities. Use kneepads when playing volleyball or laying carpet. Buckle your seat belt every time you drive. Most shattered kneecaps occur in car accidents.  Rest when you are tired. SEEK MEDICAL CARE IF:  You have knee pain that is continual and does not seem to be getting better.  SEEK IMMEDIATE MEDICAL CARE IF:  Your knee joint feels hot  to the touch and you have a high fever. MAKE SURE YOU:   Understand these instructions.  Will watch your condition.  Will get help right away if you are not doing well or get worse. Document Released: 07/21/2007 Document Revised: 12/16/2011 Document Reviewed: 07/21/2007 [employer] Patient Information 2015 [place], [place]. This information is not intended to replace advice given to you by your health care provider. Make sure you discuss any questions you have with your health care provider.

## 2014-11-17 NOTE — ED Notes (Signed)
Informed pt to come to radiology 11/17/14 at 1315 for ultrasound of lower rt leg at 1330. Pt and wife verbalized understanding.

## 2015-10-23 ENCOUNTER — Other Ambulatory Visit (HOSPITAL_COMMUNITY): Payer: Self-pay | Admitting: Internal Medicine

## 2015-10-23 ENCOUNTER — Ambulatory Visit (HOSPITAL_COMMUNITY)
Admission: RE | Admit: 2015-10-23 | Discharge: 2015-10-23 | Disposition: A | Discharge status: Home / Self Care | Payer: BC Managed Care – PPO | Source: Ambulatory Visit | Attending: Internal Medicine | Admitting: Internal Medicine

## 2015-10-23 DIAGNOSIS — R059 Cough, unspecified: Secondary | ICD-10-CM

## 2015-10-23 DIAGNOSIS — R05 Cough: Secondary | ICD-10-CM

## 2015-10-23 DIAGNOSIS — M954 Acquired deformity of chest and rib: Secondary | ICD-10-CM | POA: Insufficient documentation

## 2015-12-06 ENCOUNTER — Telehealth: Payer: Self-pay | Admitting: Orthopaedic Surgery

## 2015-12-06 NOTE — Telephone Encounter (Signed)
Hydrocodone 7.5/325  Qty 120 Tablets

## 2015-12-07 MED ORDER — HYDROCODONE-ACETAMINOPHEN 7.5-325 MG PO TABS: 1.0000 | ORAL_TABLET | ORAL | Status: DC | PRN

## 2015-12-07 NOTE — Telephone Encounter (Signed)
Rx printed

## 2015-12-07 NOTE — Telephone Encounter (Signed)
Prescription available, patient aware  

## 2015-12-12 ENCOUNTER — Ambulatory Visit: Payer: BC Managed Care – PPO | Admitting: Orthopaedic Surgery

## 2016-01-02 ENCOUNTER — Telehealth: Payer: Self-pay | Admitting: Orthopaedic Surgery

## 2016-01-02 NOTE — Telephone Encounter (Signed)
Too early.  Not due untile 4-2

## 2016-01-02 NOTE — Telephone Encounter (Signed)
Patient requesting Hydrocodone refill

## 2016-01-08 ENCOUNTER — Telehealth: Payer: Self-pay | Admitting: Orthopaedic Surgery

## 2016-01-08 MED ORDER — HYDROCODONE-ACETAMINOPHEN 7.5-325 MG PO TABS: 1.0000 | ORAL_TABLET | ORAL | Status: DC | PRN

## 2016-01-08 NOTE — Telephone Encounter (Signed)
Rx done. 

## 2016-01-08 NOTE — Telephone Encounter (Signed)
Patient requesting refill of Hydrocodone 7.5/325mg Qty 120 Tablets °

## 2016-01-09 NOTE — Telephone Encounter (Signed)
Done yesterday.

## 2016-02-07 ENCOUNTER — Telehealth: Payer: Self-pay | Admitting: Orthopaedic Surgery

## 2016-02-07 NOTE — Telephone Encounter (Signed)
Have not seen in a while.  No medicine.

## 2016-02-07 NOTE — Telephone Encounter (Signed)
Hydrocodone-Acetaminophen 7.5/325mg Qty 120 Tablets °

## 2016-02-11 ENCOUNTER — Emergency Department (HOSPITAL_COMMUNITY)
Admission: EM | Admit: 2016-02-11 | Discharge: 2016-02-12 | Disposition: A | Discharge status: Home / Self Care | Payer: BC Managed Care – PPO | Attending: Emergency Medicine | Admitting: Emergency Medicine

## 2016-02-11 ENCOUNTER — Encounter (HOSPITAL_COMMUNITY): Payer: Self-pay | Admitting: *Deleted

## 2016-02-11 DIAGNOSIS — Y999 Unspecified external cause status: Secondary | ICD-10-CM | POA: Diagnosis not present

## 2016-02-11 DIAGNOSIS — S46911A Strain of unspecified muscle, fascia and tendon at shoulder and upper arm level, right arm, initial encounter: Secondary | ICD-10-CM

## 2016-02-11 DIAGNOSIS — Y9361 Activity, american tackle football: Secondary | ICD-10-CM | POA: Insufficient documentation

## 2016-02-11 DIAGNOSIS — Y92321 Football field as the place of occurrence of the external cause: Secondary | ICD-10-CM | POA: Diagnosis not present

## 2016-02-11 DIAGNOSIS — M19011 Primary osteoarthritis, right shoulder: Secondary | ICD-10-CM | POA: Diagnosis not present

## 2016-02-11 DIAGNOSIS — M19019 Primary osteoarthritis, unspecified shoulder: Secondary | ICD-10-CM

## 2016-02-11 DIAGNOSIS — X509XXA Other and unspecified overexertion or strenuous movements or postures, initial encounter: Secondary | ICD-10-CM | POA: Insufficient documentation

## 2016-02-11 DIAGNOSIS — M25511 Pain in right shoulder: Secondary | ICD-10-CM | POA: Diagnosis present

## 2016-02-11 NOTE — ED Notes (Signed)
Pt c/o right shoulder pain that started yesterday; pt states he thinks he injured it while playing football yesterday

## 2016-02-12 ENCOUNTER — Emergency Department (HOSPITAL_COMMUNITY): Payer: BC Managed Care – PPO

## 2016-02-12 MED ORDER — HYDROCODONE-ACETAMINOPHEN 5-325 MG PO TABS: 1.0000 | ORAL_TABLET | ORAL | Status: DC | PRN

## 2016-02-12 MED ORDER — KETOROLAC TROMETHAMINE 10 MG PO TABS
10.0000 mg | ORAL_TABLET | Freq: Once | ORAL | Status: AC
Start: 2016-02-12 — End: 2016-02-12
  Administered 2016-02-12: 10 mg via ORAL
  Filled 2016-02-12: qty 1

## 2016-02-12 MED ORDER — MELOXICAM 15 MG PO TABS: 15.0000 mg | ORAL_TABLET | Freq: Every day | ORAL | Status: DC

## 2016-02-12 MED ORDER — METHOCARBAMOL 500 MG PO TABS
1000.0000 mg | ORAL_TABLET | Freq: Once | ORAL | Status: AC
  Administered 2016-02-12: 1000 mg via ORAL
  Filled 2016-02-12: qty 2

## 2016-02-12 MED ORDER — HYDROCODONE-ACETAMINOPHEN 5-325 MG PO TABS
1.0000 | ORAL_TABLET | Freq: Once | ORAL | Status: AC
  Administered 2016-02-12: 1 via ORAL
  Filled 2016-02-12: qty 1

## 2016-02-12 MED ORDER — METHOCARBAMOL 500 MG PO TABS: 500.0000 mg | ORAL_TABLET | Freq: Three times a day (TID) | ORAL | Status: DC

## 2016-02-12 MED ORDER — DEXAMETHASONE 4 MG PO TABS: 4.0000 mg | ORAL_TABLET | Freq: Two times a day (BID) | ORAL | Status: DC

## 2016-02-12 NOTE — ED Provider Notes (Signed)
CSN: [id number]     Arrival date & time 02/11/16  2214 History   First MD Initiated Contact with Patient 02/11/16 2358     Chief Complaint  Patient presents with  . Shoulder Pain     (Consider location/radiation/quality/duration/timing/severity/associated sxs/prior Treatment) HPI Comments: Patient is a 50 year old male who presents to the emergency department with complaint of right shoulder pain.  The patient states that on May 6 he was playing football, he went to throw past when someone grabbed his arm. He felt immediate pain present and has had problems with pain and discomfort since that time. He now has pain when attempting to raise his arm above his head on. The patient has not had any previous operations or procedures involving the right shoulder. Patient denies being on any anticoagulation medications at this time. No other injury reported.  Patient is a 50 y.o. male presenting with shoulder pain. The history is provided by the patient.  Shoulder Pain Location:  Shoulder   History reviewed. No pertinent past medical history. Past Surgical History  Procedure Laterality Date  . Cholecystectomy     History reviewed. No pertinent family history. Social History  Substance Use Topics  . Smoking status: Never Smoker   . Smokeless tobacco: None  . Alcohol Use: Yes     Comment: occasional    Review of Systems  Musculoskeletal:       Shoulder pain  All other systems reviewed and are negative.     Allergies  Review of patient's allergies indicates no known allergies.  Home Medications   Prior to Admission medications   Medication Sig Start Date End Date Taking? Authorizing Provider  HYDROcodone-acetaminophen (NORCO) 7.5-325 MG tablet Take 1 tablet by mouth every 4 (four) hours as needed for moderate pain (Must last 30 days.  Do not drive or operate machinery while taking this medicine.). 01/08/16   Darreld McleanWayne Keeling, MD  omeprazole (PRILOSEC) 40 MG capsule Take 40 mg by mouth  daily.    Historical Provider, MD  ranitidine (ZANTAC) 150 MG tablet Take 150 mg by mouth 2 (two) times daily.    Historical Provider, MD   BP 129/91 mmHg  Pulse 78  Temp(Src) 98.1 F (36.7 C) (Oral)  Resp 20  Ht 5\' 5"  (1.651 m)  Wt 106.595 kg  BMI 39.11 kg/m2  SpO2 100% Physical Exam  Constitutional: He is oriented to person, place, and time. He appears well-developed and well-nourished.  Non-toxic appearance.  HENT:  Head: Normocephalic.  Right Ear: Tympanic membrane and external ear normal.  Left Ear: Tympanic membrane and external ear normal.  Eyes: EOM and lids are normal. Pupils are equal, round, and reactive to light.  Neck: Normal range of motion. Neck supple. Carotid bruit is not present.  Cardiovascular: Normal rate, regular rhythm, normal heart sounds, intact distal pulses and normal pulses.   Pulmonary/Chest: Breath sounds normal. No respiratory distress.  Abdominal: Soft. Bowel sounds are normal. There is no tenderness. There is no guarding.  Musculoskeletal:       Right shoulder: He exhibits decreased range of motion, tenderness and pain. He exhibits no deformity.  Lymphadenopathy:       Head (right side): No submandibular adenopathy present.       Head (left side): No submandibular adenopathy present.    He has no cervical adenopathy.  Neurological: He is alert and oriented to person, place, and time. He has normal strength. No cranial nerve deficit or sensory deficit.  Skin: Skin is warm and dry.  Psychiatric: He has a normal mood and affect. His speech is normal.  Nursing note and vitals reviewed.   ED Course  Procedures (including critical care time) Labs Review Labs Reviewed - No data to display  Imaging Review No results found. I have personally reviewed and evaluated these images and lab results as part of my medical decision-making.   EKG Interpretation None      MDM  X-ray of the right shoulder is negative for fracture or dislocation. There no  gross neurologic or vascular deficits appreciated at this time. The patient is fitted with a sling. Prescription for Robaxin, Norco, Decadron, and Modic given to the patient. Patient referred to orthopedics for additional evaluation.    Final diagnoses:  None    *I have reviewed nursing notes, vital signs, and all appropriate lab and imaging results for this patient.[address] PA-C 02/13/16 1225  Devoria Albe, MD 03/04/16 1901

## 2016-02-12 NOTE — Discharge Instructions (Signed)
Your x-ray is negative for fracture or dislocation. There is noted degenerative changes involving the before meals joint of your right shoulder. Your examination favors shoulder strain, as well as degenerative changes of your shoulder. Please use your sling until seen by the orthopedic specialist. Use Robaxin, Decadron, and Mobic daily. Use Norco for more severe pain. Robaxin and Norco may cause drowsiness, please use these medications with caution. Osteoarthritis Osteoarthritis is a disease that causes soreness and inflammation of a joint. It occurs when the cartilage at the affected joint wears down. Cartilage acts as a cushion, covering the ends of bones where they meet to form a joint. Osteoarthritis is the most common form of arthritis. It often occurs in older people. The joints affected most often by this condition include those in the:  Ends of the fingers.  Thumbs.  Neck.  Lower back.  Knees.  Hips. CAUSES  Over time, the cartilage that covers the ends of bones begins to wear away. This causes bone to rub on bone, producing pain and stiffness in the affected joints.  RISK FACTORS Certain factors can increase your chances of having osteoarthritis, including:  Older age.  Excessive body weight.  Overuse of joints.  Previous joint injury. SIGNS AND SYMPTOMS   Pain, swelling, and stiffness in the joint.  Over time, the joint may lose its normal shape.  Small deposits of bone (osteophytes) may grow on the edges of the joint.  Bits of bone or cartilage can break off and float inside the joint space. This may cause more pain and damage. DIAGNOSIS  Your health care provider will do a physical exam and ask about your symptoms. Various tests may be ordered, such as:  X-rays of the affected joint.  Blood tests to rule out other types of arthritis. Additional tests may be used to diagnose your condition. TREATMENT  Goals of treatment are to control pain and improve joint  function. Treatment plans may include:  A prescribed exercise program that allows for rest and joint relief.  A weight control plan.  Pain relief techniques, such as:  Properly applied heat and cold.  Electric pulses delivered to nerve endings under the skin (transcutaneous electrical nerve stimulation [TENS]).  Massage.  Certain nutritional supplements.  Medicines to control pain, such as:  Acetaminophen.  Nonsteroidal anti-inflammatory drugs (NSAIDs), such as naproxen.  Narcotic or central-acting agents, such as tramadol.  Corticosteroids. These can be given orally or as an injection.  Surgery to reposition the bones and relieve pain (osteotomy) or to remove loose pieces of bone and cartilage. Joint replacement may be needed in advanced states of osteoarthritis. HOME CARE INSTRUCTIONS   Take medicines only as directed by your health care provider.  Maintain a healthy weight. Follow your health care provider's instructions for weight control. This may include dietary instructions.  Exercise as directed. Your health care provider can recommend specific types of exercise. These may include:  Strengthening exercises. These are done to strengthen the muscles that support joints affected by arthritis. They can be performed with weights or with exercise bands to add resistance.  Aerobic activities. These are exercises, such as brisk walking or low-impact aerobics, that get your heart pumping.  Range-of-motion activities. These keep your joints limber.  Balance and agility exercises. These help you maintain daily living skills.  Rest your affected joints as directed by your health care provider.  Keep all follow-up visits as directed by your health care provider. SEEK MEDICAL CARE IF:   Your skin  turns red.  You develop a rash in addition to your joint pain.  You have worsening joint pain.  You have a fever along with joint or muscle aches. SEEK IMMEDIATE MEDICAL CARE  IF:  You have a significant loss of weight or appetite.  You have night sweats. FOR MORE INFORMATION   National Institute of Arthritis and Musculoskeletal and Skin Diseases: www.niams.http://www.myers.net/  [employer] on Aging: https://walker.com/  American College of Rheumatology: www.rheumatology.org   This information is not intended to replace advice given to you by your health care provider. Make sure you discuss any questions you have with your health care provider.   Document Released: 09/23/2005 Document Revised: 10/14/2014 Document Reviewed: 05/31/2013 Elsevier Interactive Patient Education [employer]

## 2016-02-13 ENCOUNTER — Ambulatory Visit (INDEPENDENT_AMBULATORY_CARE_PROVIDER_SITE_OTHER): Payer: BC Managed Care – PPO | Admitting: Orthopaedic Surgery

## 2016-02-13 ENCOUNTER — Encounter: Payer: Self-pay | Admitting: Orthopaedic Surgery

## 2016-02-13 VITALS — BP 127/80 | HR 83 | Temp 98.2°F | Resp 16 | Ht 65.0 in | Wt 235.0 lb

## 2016-02-13 DIAGNOSIS — M25511 Pain in right shoulder: Secondary | ICD-10-CM

## 2016-02-13 MED ORDER — HYDROCODONE-ACETAMINOPHEN 5-325 MG PO TABS: 1.0000 | ORAL_TABLET | ORAL | Status: DC | PRN

## 2016-02-13 MED ORDER — NAPROXEN 500 MG PO TABS: 500.0000 mg | ORAL_TABLET | Freq: Two times a day (BID) | ORAL | Status: DC

## 2016-02-13 NOTE — Patient Instructions (Signed)
OOW note for one week

## 2016-02-13 NOTE — Progress Notes (Signed)
Patient RU:Francisco Fox Francisco Fox, male DOB:09/06/1966, 50 y.o. JWJ:[id number]  Chief Complaint  Patient presents with  . Shoulder Pain    ER FOLLOW UP RIGHT SHOULDER PAIN    HPI  Francisco Fox is a 50 y.o. male who was playing football on May 6 and fell and hit his right shoulder. He is right hand dominant.  He went to the ER yesterday, May 8th.  X-rays were negative.  He was told he had a strain.  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  He has no paresthesias.  He has no redness or swelling of the right shoulder.  He has pain with motion overhead.  He has tired the medicine from the ER and ice with only slightly helping.  HPI  Body mass index is 39.11 kg/(m^2).  Review of Systems  HENT: Negative for congestion.   Respiratory: Negative for cough and shortness of breath.   Cardiovascular: Negative for chest pain and leg swelling.  Endocrine: Negative for cold intolerance.  Musculoskeletal: Positive for arthralgias.  Allergic/Immunologic: Negative for environmental allergies.    History reviewed. No pertinent past medical history.  Past Surgical History  Procedure Laterality Date  . Cholecystectomy      History reviewed. No pertinent family history.  Social History Social History  Substance Use Topics  . Smoking status: Never Smoker   . Smokeless tobacco: None  . Alcohol Use: Yes     Comment: occasional    No Known Allergies  Current Outpatient Prescriptions  Medication Sig Dispense Refill  . dexamethasone (DECADRON) 4 MG tablet Take 1 tablet (4 mg total) by mouth 2 (two) times daily with a meal. 12 tablet 0  . methocarbamol (ROBAXIN) 500 MG tablet Take 1 tablet (500 mg total) by mouth 3 (three) times daily. 21 tablet 0  . omeprazole (PRILOSEC) 40 MG capsule Take 40 mg by mouth daily.    Marland Kitchen HYDROcodone-acetaminophen (NORCO/VICODIN) 5-325 MG tablet Take 1 tablet by mouth every 4 (four) hours as needed for moderate pain (Must last 15 days.Do not take and  drive a car or use machinery.). 60 tablet 0  . naproxen (NAPROSYN) 500 MG tablet Take 1 tablet (500 mg total) by mouth 2 (two) times daily with a meal. 60 tablet 5   No current facility-administered medications for this visit.     Physical Exam  Blood pressure 127/80, pulse 83, temperature 98.2 F (36.8 C), resp. rate 16, height  (1.651 m), weight 235 lb (106.595 kg).  Constitutional: overall normal hygiene, normal nutrition, well developed, normal grooming, normal body habitus. Assistive device:none  Musculoskeletal: gait and station Limp none, muscle tone and strength are normal, no tremors or atrophy is present.  .  Neurological: coordination overall normal.  Deep tendon reflex/nerve stretch intact.  Sensation normal.  Cranial nerves II-XII intact.   Skin:   normal overall no scars, lesions, ulcers or rashes. No psoriasis.  Psychiatric: Alert and oriented x 3.  Recent memory intact, remote memory unclear.  Normal mood and affect. Well groomed.  Good eye contact.  Cardiovascular: overall no swelling, no varicosities, no edema bilaterally, normal temperatures of the legs and arms, no clubbing, cyanosis and good capillary refill.  Lymphatic: palpation is normal. Examination of right Upper Extremity is done.  Inspection:   Overall:  Elbow non-tender without crepitus or defects, forearm non-tender without crepitus or defects, wrist non-tender without crepitus or defects, hand non-tender.    Shoulder: with glenohumeral joint tenderness, without  effusion.   Upper arm: without swelling and tenderness   Range of motion:   Overall:  Full range of motion of the elbow, full range of motion of wrist and full range of motion in fingers.   Shoulder:  right  160 degrees forward flexion; 145 degrees abduction; 35 degrees internal rotation, 35 degrees external rotation, 15 degrees extension, 40 degrees adduction.   Stability:   Overall:  Shoulder, elbow and wrist stable   Strength and  Tone:   Overall full shoulder muscles strength, full upper arm strength and normal upper arm bulk and tone.  Left shoulder is normal.  Neck is normal.  The patient has been educated about the nature of the problem(s) and counseled on treatment options.  The patient appeared to understand what I have discussed and is in agreement with it.  Encounter Diagnosis  Name Primary?  . Right shoulder pain Yes    PLAN Call if any problems.  Precautions discussed.  Continue current medications.   Return to clinic 1 week   Stay out of work.  Begin Naprosyn.  Continue hydrocodone.

## 2016-02-20 ENCOUNTER — Encounter: Payer: Self-pay | Admitting: Orthopaedic Surgery

## 2016-02-20 ENCOUNTER — Ambulatory Visit: Payer: BC Managed Care – PPO | Admitting: Orthopaedic Surgery

## 2016-02-20 ENCOUNTER — Ambulatory Visit (INDEPENDENT_AMBULATORY_CARE_PROVIDER_SITE_OTHER): Payer: BC Managed Care – PPO | Admitting: Orthopaedic Surgery

## 2016-02-20 VITALS — BP 135/90 | HR 87 | Temp 97.7°F | Ht 65.0 in | Wt 235.0 lb

## 2016-02-20 DIAGNOSIS — M25511 Pain in right shoulder: Secondary | ICD-10-CM | POA: Diagnosis not present

## 2016-02-20 NOTE — Patient Instructions (Signed)
Work note, return to work 02/21/16

## 2016-02-20 NOTE — Progress Notes (Signed)
Patient ZO:XWRUE Francisco Fox, male DOB:12/17/1965, 49 y.o. AVW:[id number]  Chief Complaint  Patient presents with  . Follow-up    Right Shoulder    HPI  Francisco Fox is a 50 y.o. male who is seen in follow-up for the right shoulder pain and strain he had.  He is much improved today.  He has little pain, little problem.  He has no redness or paresthesias.  He has been doing his exercises and taking his medicine.  I will have him return to work tomorrow.  HPI  Body mass index is 39.11 kg/(m^2).  Review of Systems  HENT: Negative for congestion.   Respiratory: Negative for cough and shortness of breath.   Cardiovascular: Negative for chest pain and leg swelling.  Endocrine: Negative for cold intolerance.  Musculoskeletal: Positive for arthralgias.  Allergic/Immunologic: Negative for environmental allergies.    History reviewed. No pertinent past medical history.  Past Surgical History  Procedure Laterality Date  . Cholecystectomy      History reviewed. No pertinent family history.  Social History Social History  Substance Use Topics  . Smoking status: Never Smoker   . Smokeless tobacco: None  . Alcohol Use: Yes     Comment: occasional    No Known Allergies  Current Outpatient Prescriptions  Medication Sig Dispense Refill  . dexamethasone (DECADRON) 4 MG tablet Take 1 tablet (4 mg total) by mouth 2 (two) times daily with a meal. 12 tablet 0  . HYDROcodone-acetaminophen (NORCO/VICODIN) 5-325 MG tablet Take 1 tablet by mouth every 4 (four) hours as needed for moderate pain (Must last 15 days.Do not take and drive a car or use machinery.). 60 tablet 0  . methocarbamol (ROBAXIN) 500 MG tablet Take 1 tablet (500 mg total) by mouth 3 (three) times daily. 21 tablet 0  . naproxen (NAPROSYN) 500 MG tablet Take 1 tablet (500 mg total) by mouth 2 (two) times daily with a meal. 60 tablet 5  . omeprazole (PRILOSEC) 40 MG capsule Take 40 mg by mouth daily.     No current  facility-administered medications for this visit.     Physical Exam  Blood pressure 135/90, pulse 87, temperature 97.7 F (36.5 C), height  (1.651 m), weight 235 lb (106.595 kg).  Constitutional: overall normal hygiene, normal nutrition, well developed, normal grooming, normal body habitus. Assistive device:none  Musculoskeletal: gait and station Limp none, muscle tone and strength are normal, no tremors or atrophy is present.  .  Neurological: coordination overall normal.  Deep tendon reflex/nerve stretch intact.  Sensation normal.  Cranial nerves II-XII intact.   Skin:   normal overall no scars, lesions, ulcers or rashes. No psoriasis.  Psychiatric: Alert and oriented x 3.  Recent memory intact, remote memory unclear.  Normal mood and affect. Well groomed.  Good eye contact.  Cardiovascular: overall no swelling, no varicosities, no edema bilaterally, normal temperatures of the legs and arms, no clubbing, cyanosis and good capillary refill.  Lymphatic: palpation is normal.  Examination of right Upper Extremity is done.  Inspection:   Overall:  Elbow non-tender without crepitus or defects, forearm non-tender without crepitus or defects, wrist non-tender without crepitus or defects, hand non-tender.    Shoulder: with glenohumeral joint tenderness, without effusion.   Upper arm: without swelling and tenderness   Range of motion:   Overall:  Full range of motion of the elbow, full range of motion of wrist and full range of motion in fingers.   Shoulder:  right  full degrees  forward flexion; full degrees abduction; full degrees internal rotation, full degrees external rotation, full degrees extension, full degrees adduction.   Stability:   Overall:  Shoulder, elbow and wrist stable   Strength and Tone:   Overall full shoulder muscles strength, full upper arm strength and normal upper arm bulk and tone.  Left shoulder full motion, no tenderness  The patient has been educated  about the nature of the problem(s) and counseled on treatment options.  The patient appeared to understand what I have discussed and is in agreement with it.  Encounter Diagnosis  Name Primary?  . Right shoulder pain Yes    PLAN Call if any problems.  Precautions discussed.  Continue current medications.   Return to clinic as needed.

## 2016-02-28 ENCOUNTER — Telehealth: Payer: Self-pay | Admitting: Orthopaedic Surgery

## 2016-02-28 MED ORDER — HYDROCODONE-ACETAMINOPHEN 5-325 MG PO TABS: 1.0000 | ORAL_TABLET | ORAL | Status: DC | PRN

## 2016-02-28 NOTE — Telephone Encounter (Signed)
Patient/spouse Francisco Fox, designated party contact on fil, called to request refill of medication: HYDROcodone-acetaminophen (NORCO/VICODIN) 5-325 MG tablet [[id number]][[id number]] - quantity 60.  Please advise.

## 2016-02-28 NOTE — Telephone Encounter (Signed)
Rx Done . 

## 2016-03-13 ENCOUNTER — Telehealth: Payer: Self-pay | Admitting: Orthopaedic Surgery

## 2016-03-13 MED ORDER — HYDROCODONE-ACETAMINOPHEN 5-325 MG PO TABS: 1.0000 | ORAL_TABLET | ORAL | Status: DC | PRN

## 2016-03-13 NOTE — Telephone Encounter (Signed)
Hydrocodone-Acetaminophen  5/325mg  Qty 60 Tablets °

## 2016-03-13 NOTE — Telephone Encounter (Signed)
Rx done. 

## 2016-03-28 ENCOUNTER — Telehealth: Payer: Self-pay | Admitting: Orthopaedic Surgery

## 2016-03-28 MED ORDER — HYDROCODONE-ACETAMINOPHEN 5-325 MG PO TABS: 1.0000 | ORAL_TABLET | ORAL | Status: DC | PRN

## 2016-03-28 NOTE — Telephone Encounter (Signed)
Patient called and requested a refill on Hydrocodone-Acetaminophen (Norco)  5-325 mgs.  Qty  60   °       °Sig: Take 1 tablet by mouth every 4 (four) hours as needed for moderate pain (Must last 15 days.  Do not take and drive a car or use machinery.).   °  °  ° ° °

## 2016-03-28 NOTE — Telephone Encounter (Signed)
Rx done. 

## 2016-04-06 ENCOUNTER — Telehealth: Payer: Self-pay | Admitting: Orthopaedic Surgery

## 2016-04-10 NOTE — Telephone Encounter (Signed)
Rx done. 

## 2016-04-11 ENCOUNTER — Telehealth: Payer: Self-pay | Admitting: Orthopaedic Surgery

## 2016-04-11 MED ORDER — HYDROCODONE-ACETAMINOPHEN 5-325 MG PO TABS: 1.0000 | ORAL_TABLET | ORAL | Status: DC | PRN

## 2016-04-11 NOTE — Telephone Encounter (Signed)
Patient called and requested a refill on Hydrocodone-Acetaminophen  5-325 mgs.  Qty  60    Sig: Take 1 tablet by mouth every 4 (four) hours as needed for moderate pain (Must last 15 days.Do not take and drive a car or use machinery.).

## 2016-04-11 NOTE — Telephone Encounter (Signed)
Rx done. 

## 2016-04-25 ENCOUNTER — Telehealth: Payer: Self-pay | Admitting: Orthopaedic Surgery

## 2016-04-25 MED ORDER — HYDROCODONE-ACETAMINOPHEN 5-325 MG PO TABS: 1.0000 | ORAL_TABLET | ORAL | Status: DC | PRN

## 2016-04-25 NOTE — Telephone Encounter (Signed)
Patient requests a refill on Hydrocodone/Acetaminophen (Norco) 5-325 mgs.   Qty  60 ° ° °Sig: Take 1 tablet by mouth every 4 (four) hours as needed for moderate pain (Must last 15 days.  Do not take and drive a car or use machinery.). °

## 2016-04-25 NOTE — Telephone Encounter (Signed)
Rx done. 

## 2016-05-02 ENCOUNTER — Encounter: Payer: Self-pay | Admitting: Orthopaedic Surgery

## 2016-05-02 ENCOUNTER — Ambulatory Visit (INDEPENDENT_AMBULATORY_CARE_PROVIDER_SITE_OTHER): Payer: BC Managed Care – PPO | Admitting: Orthopaedic Surgery

## 2016-05-02 VITALS — BP 139/89 | HR 90 | Temp 97.7°F | Ht 65.0 in | Wt 222.8 lb

## 2016-05-02 DIAGNOSIS — M25511 Pain in right shoulder: Secondary | ICD-10-CM

## 2016-05-02 MED ORDER — HYDROCODONE-ACETAMINOPHEN 5-325 MG PO TABS: 1.0000 | ORAL_TABLET | ORAL | 0 refills | Status: DC | PRN

## 2016-05-02 NOTE — Progress Notes (Signed)
CC:  My right shoulder is worse  His right shoulder has more pain and more tenderness over the last two weeks.  He has no trauma, no paresthesias.  He has been taking his medicine.  Examination of right Upper Extremity is done.  Inspection:   Overall:  Elbow non-tender without crepitus or defects, forearm non-tender without crepitus or defects, wrist non-tender without crepitus or defects, hand non-tender.    Shoulder: with glenohumeral joint tenderness, without effusion.   Upper arm: without swelling and tenderness   Range of motion:   Overall:  Full range of motion of the elbow, full range of motion of wrist and full range of motion in fingers.   Shoulder:  right  165 degrees forward flexion; 145 degrees abduction; 35 degrees internal rotation, 35 degrees external rotation, 15 degrees extension, 40 degrees adduction.   Stability:   Overall:  Shoulder, elbow and wrist stable   Strength and Tone:   Overall full shoulder muscles strength, full upper arm strength and normal upper arm bulk and tone.   Encounter Diagnosis  Name Primary?  . Right shoulder pain Yes   PROCEDURE NOTE:  The patient request injection, verbal consent was obtained.  The right shoulder was prepped appropriately after time out was performed.   Sterile technique was observed and injection of 1 cc of Depo-Medrol 40 mg with several cc's of plain xylocaine. Anesthesia was provided by ethyl chloride and a 20-gauge needle was used to inject the shoulder area. A posterior approach was used.  The injection was tolerated well.  A band aid dressing was applied.  The patient was advised to apply ice later today and tomorrow to the injection sight as needed.  Return in two weeks.  If not improved, consider MRI.  Call if any problem.  Precautions discussed.  Continue the Naprosyn.  Electronically Signed Darreld Mclean, MD 7/27/20178:28 AM

## 2016-05-16 ENCOUNTER — Encounter: Payer: Self-pay | Admitting: Orthopaedic Surgery

## 2016-05-16 ENCOUNTER — Ambulatory Visit (INDEPENDENT_AMBULATORY_CARE_PROVIDER_SITE_OTHER): Payer: BC Managed Care – PPO | Admitting: Orthopaedic Surgery

## 2016-05-16 VITALS — BP 118/83 | HR 108 | Temp 98.1°F | Ht 65.0 in | Wt 214.8 lb

## 2016-05-16 DIAGNOSIS — M25511 Pain in right shoulder: Secondary | ICD-10-CM | POA: Diagnosis not present

## 2016-05-16 NOTE — Progress Notes (Signed)
Patient ZO:XWRUE:Francisco Fox, male DOB:1966-06-09, 50 y.o. AVW:[id number]  Chief Complaint  Patient presents with  . Follow-up    Right shoulder    HPI  Francisco Fox is a 50 y.o. male who has continued pain of the right shoulder.  He has had NSAIDs, rest, exercises, heat, ice and injection.  He still has pain in the shoulder. He has been doing his exercises.  He has no new trauma, no redness, no paresthesias.   I will get MRI of the right shoulder as he is not improved.  HPI  Body mass index is 35.74 kg/m.  ROS  Review of Systems  HENT: Negative for congestion.   Respiratory: Negative for cough and shortness of breath.   Cardiovascular: Negative for chest pain and leg swelling.  Endocrine: Negative for cold intolerance.  Musculoskeletal: Positive for arthralgias.  Allergic/Immunologic: Negative for environmental allergies.    History reviewed. No pertinent past medical history.  Past Surgical History:  Procedure Laterality Date  . CHOLECYSTECTOMY      History reviewed. No pertinent family history.  Social History Social History  Substance Use Topics  . Smoking status: Never Smoker  . Smokeless tobacco: Never Used  . Alcohol use Yes     Comment: occasional    No Known Allergies  Current Outpatient Prescriptions  Medication Sig Dispense Refill  . dexamethasone (DECADRON) 4 MG tablet Take 1 tablet (4 mg total) by mouth 2 (two) times daily with a meal. 12 tablet 0  . HYDROcodone-acetaminophen (NORCO/VICODIN) 5-325 MG tablet Take 1 tablet by mouth every 4 (four) hours as needed for moderate pain (Must last 15 days.Do not take and drive a car or use machinery.). 60 tablet 0  . meloxicam (MOBIC) 15 MG tablet TAKE 1 TABLET BY MOUTH EVERY DAY 30 tablet 5  . methocarbamol (ROBAXIN) 500 MG tablet Take 1 tablet (500 mg total) by mouth 3 (three) times daily. 21 tablet 0  . naproxen (NAPROSYN) 500 MG tablet Take 1 tablet (500 mg total) by mouth 2 (two) times daily with a  meal. 60 tablet 5  . omeprazole (PRILOSEC) 40 MG capsule Take 40 mg by mouth daily.     No current facility-administered medications for this visit.      Physical Exam  Blood pressure 118/83, pulse (!) 108, temperature 98.1 F (36.7 C), height 5\' 5"  (1.651 m), weight 214 lb 12.8 oz (97.4 kg).  Constitutional: overall normal hygiene, normal nutrition, well developed, normal grooming, normal body habitus. Assistive device:none  Musculoskeletal: gait and station Limp none, muscle tone and strength are normal, no tremors or atrophy is present.  .  Neurological: coordination overall normal.  Deep tendon reflex/nerve stretch intact.  Sensation normal.  Cranial nerves II-XII intact.   Skin:   normal overall no scars, lesions, ulcers or rashes. No psoriasis.  Psychiatric: Alert and oriented x 3.  Recent memory intact, remote memory unclear.  Normal mood and affect. Well groomed.  Good eye contact.  Cardiovascular: overall no swelling, no varicosities, no edema bilaterally, normal temperatures of the legs and arms, no clubbing, cyanosis and good capillary refill.  Lymphatic: palpation is normal.  Examination of right Upper Extremity is done.  Inspection:   Overall:  Elbow non-tender without crepitus or defects, forearm non-tender without crepitus or defects, wrist non-tender without crepitus or defects, hand non-tender.    Shoulder: with glenohumeral joint tenderness, without effusion.   Upper arm: without swelling and tenderness   Range of motion:   Overall:  Full  range of motion of the elbow, full range of motion of wrist and full range of motion in fingers.   Shoulder:  right  165 degrees forward flexion; 145 degrees abduction; 35 degrees internal rotation, 35 degrees external rotation, 20 degrees extension, 40 degrees adduction.   Stability:   Overall:  Shoulder, elbow and wrist stable   Strength and Tone:   Overall full shoulder muscles strength, full upper arm strength and  normal upper arm bulk and tone.   The patient has been educated about the nature of the problem(s) and counseled on treatment options.  The patient appeared to understand what I have discussed and is in agreement with it.  Encounter Diagnosis  Name Primary?  . Right shoulder pain Yes    PLAN Call if any problems.  Precautions discussed.  Continue current medications.   Return to clinic after MRI of the right shoulder   Continue his exercises.  Continue Medicine.  Electronically Signed Darreld Mclean, MD 8/10/20174:27 PM

## 2016-05-24 ENCOUNTER — Ambulatory Visit
Admission: RE | Admit: 2016-05-24 | Discharge: 2016-05-24 | Disposition: A | Discharge status: Home / Self Care | Payer: BC Managed Care – PPO | Source: Ambulatory Visit | Attending: Orthopaedic Surgery | Admitting: Orthopaedic Surgery

## 2016-05-24 DIAGNOSIS — M25511 Pain in right shoulder: Secondary | ICD-10-CM

## 2016-05-29 ENCOUNTER — Ambulatory Visit (INDEPENDENT_AMBULATORY_CARE_PROVIDER_SITE_OTHER): Payer: BC Managed Care – PPO | Admitting: Orthopaedic Surgery

## 2016-05-29 ENCOUNTER — Encounter: Payer: Self-pay | Admitting: Orthopaedic Surgery

## 2016-05-29 VITALS — BP 153/90 | HR 100 | Temp 97.3°F | Ht 65.0 in | Wt 224.0 lb

## 2016-05-29 DIAGNOSIS — M75101 Unspecified rotator cuff tear or rupture of right shoulder, not specified as traumatic: Secondary | ICD-10-CM

## 2016-05-29 MED ORDER — OXYCODONE-ACETAMINOPHEN 5-325 MG PO TABS: 1.0000 | ORAL_TABLET | ORAL | 0 refills | Status: DC | PRN

## 2016-05-29 NOTE — Progress Notes (Signed)
Patient WU:JWJXB:Francisco Fox, male DOB:[birth year], 50 y.o. JYN:[id number]  Chief Complaint  Patient presents with  . Results    Right Shoulder    HPI  Francisco Fox is a 50 y.o. male who has right shoulder pain that is not any better, in fact it is worse.  He had MRI of the right shoulder and it shows: IMPRESSION: 1. Large full-thickness rotator cuff tear as described involving the supraspinatus and infraspinatus tendons. There is associated tendon retraction and muscular atrophy/edema. 2. 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 3. Superior labral degeneration without evidence of tear. 4. Moderate acromioclavicular degenerative changes and subacromial -subdeltoid bursitis.  I have explained the findings.  I will set him up for OT of the right shoulder.  I will have him see Dr. Romeo AppleHarrison for discussion for surgery or not on the right shoulder.  He is agreeable to this. HPI  Body mass index is 37.28 kg/m.  ROS  Review of Systems  HENT: Negative for congestion.   Respiratory: Negative for cough and shortness of breath.   Cardiovascular: Negative for chest pain and leg swelling.  Endocrine: Negative for cold intolerance.  Musculoskeletal: Positive for arthralgias.  Allergic/Immunologic: Negative for environmental allergies.    History reviewed. No pertinent past medical history.  Past Surgical History:  Procedure Laterality Date  . CHOLECYSTECTOMY      History reviewed. No pertinent family history.  Social History Social History  Substance Use Topics  . Smoking status: Never Smoker  . Smokeless tobacco: Never Used  . Alcohol use Yes     Comment: occasional    No Known Allergies  Current Outpatient Prescriptions  Medication Sig Dispense Refill  . dexamethasone (DECADRON) 4 MG tablet Take 1 tablet (4 mg total) by mouth 2 (two) times daily with a meal. 12 tablet 0  . HYDROcodone-acetaminophen (NORCO/VICODIN) 5-325 MG tablet Take 1 tablet by mouth  every 4 (four) hours as needed for moderate pain (Must last 15 days.Do not take and drive a car or use machinery.). 60 tablet 0  . meloxicam (MOBIC) 15 MG tablet TAKE 1 TABLET BY MOUTH EVERY DAY 30 tablet 5  . methocarbamol (ROBAXIN) 500 MG tablet Take 1 tablet (500 mg total) by mouth 3 (three) times daily. 21 tablet 0  . naproxen (NAPROSYN) 500 MG tablet Take 1 tablet (500 mg total) by mouth 2 (two) times daily with a meal. 60 tablet 5  . omeprazole (PRILOSEC) 40 MG capsule Take 40 mg by mouth daily.     No current facility-administered medications for this visit.      Physical Exam  Blood pressure (!) 153/90, pulse 100, temperature 97.3 F (36.3 C), height 5\' 5"  (1.651 m), weight 224 lb (101.6 kg).  Constitutional: overall normal hygiene, normal nutrition, well developed, normal grooming, normal body habitus. Assistive device:none  Musculoskeletal: gait and station Limp none, muscle tone and strength are normal, no tremors or atrophy is present.  .  Neurological: coordination overall normal.  Deep tendon reflex/nerve stretch intact.  Sensation normal.  Cranial nerves II-XII intact.   Skin:   normal overall no scars, lesions, ulcers or rashes. No psoriasis.  Psychiatric: Alert and oriented x 3.  Recent memory intact, remote memory unclear.  Normal mood and affect. Well groomed.  Good eye contact.  Cardiovascular: overall no swelling, no varicosities, no edema bilaterally, normal temperatures of the legs and arms, no clubbing, cyanosis and good capillary refill.  Lymphatic: palpation is normal.  Examination of right Upper Extremity is  done.  Inspection:   Overall:  Elbow non-tender without crepitus or defects, forearm non-tender without crepitus or defects, wrist non-tender without crepitus or defects, hand non-tender.    Shoulder: with glenohumeral joint tenderness, without effusion.   Upper arm: without swelling and tenderness   Range of motion:   Overall:  Full range of  motion of the elbow, full range of motion of wrist and full range of motion in fingers.   Shoulder:  right  165 degrees forward flexion; 145 degrees abduction; 35 degrees internal rotation, 35 degrees external rotation, 20 degrees extension, 40 degrees adduction.   Stability:   Overall:  Shoulder, elbow and wrist stable   Strength and Tone:   Overall full shoulder muscles strength, full upper arm strength and normal upper arm bulk and tone.  The patient has been educated about the nature of the problem(s) and counseled on treatment options.  The patient appeared to understand what I have discussed and is in agreement with it.  Encounter Diagnosis  Name Primary?  . Right rotator cuff tear Yes    PLAN Call if any problems.  Precautions discussed.  Continue current medications.   Return to clinic see Dr. Romeo AppleHarrison for discussion of possible surgery.  OT therapy set up also.  Electronically Signed Darreld McleanWayne Ixel Boehning, MD 8/23/20173:47 PM

## 2016-05-30 ENCOUNTER — Telehealth: Payer: Self-pay | Admitting: Orthopedic Surgery

## 2016-05-30 NOTE — Telephone Encounter (Signed)
Patient was seen by Dr. Hilda LiasKeeling on Wednesday, August 23rd.  He has a full thickness tear of the right rotator cuff.  Dr. Hilda LiasKeeling requests an eval with Dr. Romeo AppleHarrison.

## 2016-06-05 ENCOUNTER — Encounter: Payer: Self-pay | Admitting: Orthopedic Surgery

## 2016-06-05 ENCOUNTER — Ambulatory Visit (INDEPENDENT_AMBULATORY_CARE_PROVIDER_SITE_OTHER): Payer: BC Managed Care – PPO | Admitting: Orthopedic Surgery

## 2016-06-05 VITALS — BP 154/89 | Ht 65.0 in | Wt 223.0 lb

## 2016-06-05 DIAGNOSIS — M25511 Pain in right shoulder: Secondary | ICD-10-CM | POA: Diagnosis not present

## 2016-06-05 DIAGNOSIS — M75101 Unspecified rotator cuff tear or rupture of right shoulder, not specified as traumatic: Secondary | ICD-10-CM

## 2016-06-05 NOTE — Patient Instructions (Signed)
Call office when you are ready to schedule surgery

## 2016-06-05 NOTE — Progress Notes (Signed)
Chief Complaint  Patient presents with  . Shoulder Injury    right rotator cuff tear   HPI  Chief complaint right shoulder pain   50 year old male referred to me by Dr. Hilda LiasKeeling after trauma to the right shoulder. An adequate trial of nonoperative treatment was instituted and was unsuccessful. The patient had MRI on August 18 of this year and it showed a full-thickness rotator cuff tear involving supraspinatus and infraspinatus tendons with intra-articular tendinosis of the biceps before meals joint arthrosis moderate as well as atrophy in the muscles of the infraspinatus and supraspinatus and edema but no significant fat infiltration was observed.  "Francisco Fox is a 50 y.o. male who was playing football on May 6 and fell and hit his right shoulder. He is right hand dominant.  He went to the ER yesterday, May 8th.  X-rays were negative.  He was told he had a strain.  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  He has no paresthesias.  He has no redness or swelling of the right shoulder.  He has pain with motion overhead.  He has tired the medicine from the ER and ice with only slightly helping."   Review of Systems  Constitutional: Negative for chills and fever.  Respiratory: Negative for shortness of breath.   Cardiovascular: Negative for chest pain.  Musculoskeletal: Negative for myalgias.    No past medical history reported by the patient no diabetes no hypertension  Past Surgical History:  Procedure Laterality Date  . CHOLECYSTECTOMY     No family history on file. Social History  Substance Use Topics  . Smoking status: Never Smoker  . Smokeless tobacco: Never Used  . Alcohol use Yes     Comment: occasional    Current Outpatient Prescriptions:  .  dexamethasone (DECADRON) 4 MG tablet, Take 1 tablet (4 mg total) by mouth 2 (two) times daily with a meal., Disp: 12 tablet, Rfl: 0 .  meloxicam (MOBIC) 15 MG tablet, TAKE 1 TABLET BY MOUTH EVERY DAY, Disp: 30  tablet, Rfl: 5 .  methocarbamol (ROBAXIN) 500 MG tablet, Take 1 tablet (500 mg total) by mouth 3 (three) times daily., Disp: 21 tablet, Rfl: 0 .  naproxen (NAPROSYN) 500 MG tablet, Take 1 tablet (500 mg total) by mouth 2 (two) times daily with a meal., Disp: 60 tablet, Rfl: 5 .  omeprazole (PRILOSEC) 40 MG capsule, Take 40 mg by mouth daily., Disp: , Rfl:  .  oxyCODONE-acetaminophen (PERCOCET/ROXICET) 5-325 MG tablet, Take 1 tablet by mouth every 4 (four) hours as needed for moderate pain or severe pain (Must last 15 days.Do not drive or operate machinery while taking this medicine)., Disp: 60 tablet, Rfl: 0  There were no vitals taken for this visit.  Physical Exam  Constitutional: He is oriented to person, place, and time. He appears well-developed and well-nourished. No distress.  Cardiovascular: Normal rate and intact distal pulses.   Neurological: He is alert and oriented to person, place, and time.  Skin: Skin is warm and dry. No rash noted. He is not diaphoretic. No erythema. No pallor.  Psychiatric: He has a normal mood and affect. His behavior is normal. Judgment and thought content normal.    Ortho Exam Tenderness on palpation over the rotator interval a right shoulder. He has excellent passive range of motion with painful range of motion after 120 of flexion he has weakness the supraspinatus and infraspinatus tendons. The shoulder remain stable.  Left  shoulder no tenderness to palpationand range of motion is normal strength is excellent and skin is intact there is no instability.   ASSESSMENT: My personal interpretation of the images:  The plain film shows cyst formation in the greater tuberosity normal acromion and normal glenoid with before meals joint arthrosis inferior spur and undersurface sclerosis of the acromion  The MRI shows a 2 tendon tear supraspinatus full-thickness infraspinatus partial-thickness with some atrophy. No significant fatty infiltration at this time  before meals joint arthrosis and inferior spur noted distal clavicle    PLAN We discussed the surgery with him I told him he needs to be out of work for 6 months he says he needs to put it off. I said no more than 3 months to prevent retraction and difficult repair  He recalls when he is ready for surgery  He would do a home exercise program  Fuller Canada, MD 06/05/2016 2:00 PM

## 2016-06-13 ENCOUNTER — Telehealth: Payer: Self-pay | Admitting: Orthopaedic Surgery

## 2016-06-13 ENCOUNTER — Encounter: Payer: Self-pay | Admitting: Orthopaedic Surgery

## 2016-06-13 MED ORDER — OXYCODONE-ACETAMINOPHEN 5-325 MG PO TABS: 1.0000 | ORAL_TABLET | Freq: Four times a day (QID) | ORAL | 0 refills | Status: DC | PRN

## 2016-06-13 NOTE — Telephone Encounter (Signed)
Patient requests refill:  oxyCODONE-acetaminophen (PERCOCET/ROXICET) 5-325 MG tablet 60 tablet 0 05/29/2016    Sig - Route: Take 1 tablet by mouth every 4 (four) hours as needed for moderate pain or severe pain (Must last 15 days.    - states not ready to schedule surgery as of yet per recent visit with Dr Romeo AppleHarrison.

## 2016-06-26 ENCOUNTER — Telehealth: Payer: Self-pay | Admitting: Orthopaedic Surgery

## 2016-06-27 MED ORDER — OXYCODONE-ACETAMINOPHEN 5-325 MG PO TABS: 1.0000 | ORAL_TABLET | Freq: Four times a day (QID) | ORAL | 0 refills | Status: DC | PRN

## 2016-07-10 ENCOUNTER — Other Ambulatory Visit: Payer: Self-pay | Admitting: Orthopaedic Surgery

## 2016-07-11 ENCOUNTER — Other Ambulatory Visit: Payer: Self-pay | Admitting: *Deleted

## 2016-07-11 MED ORDER — OXYCODONE-ACETAMINOPHEN 5-325 MG PO TABS: 1.0000 | ORAL_TABLET | Freq: Four times a day (QID) | ORAL | 0 refills | Status: DC | PRN

## 2016-07-23 ENCOUNTER — Telehealth: Payer: Self-pay | Admitting: Orthopedic Surgery

## 2016-07-24 MED ORDER — OXYCODONE-ACETAMINOPHEN 5-325 MG PO TABS: 1.0000 | ORAL_TABLET | Freq: Four times a day (QID) | ORAL | 0 refills | Status: DC | PRN

## 2016-08-06 ENCOUNTER — Telehealth: Payer: Self-pay | Admitting: Orthopaedic Surgery

## 2016-08-06 MED ORDER — OXYCODONE-ACETAMINOPHEN 5-325 MG PO TABS: 1.0000 | ORAL_TABLET | Freq: Four times a day (QID) | ORAL | 0 refills | Status: DC | PRN

## 2016-08-21 ENCOUNTER — Other Ambulatory Visit: Payer: Self-pay | Admitting: Orthopaedic Surgery

## 2016-08-21 ENCOUNTER — Other Ambulatory Visit: Payer: Self-pay | Admitting: *Deleted

## 2016-08-21 MED ORDER — OXYCODONE-ACETAMINOPHEN 5-325 MG PO TABS: 1.0000 | ORAL_TABLET | Freq: Four times a day (QID) | ORAL | 0 refills | Status: DC | PRN

## 2016-08-23 ENCOUNTER — Telehealth: Payer: Self-pay | Admitting: Orthopaedic Surgery

## 2016-09-04 ENCOUNTER — Telehealth: Payer: Self-pay | Admitting: Orthopedic Surgery

## 2016-09-05 MED ORDER — OXYCODONE-ACETAMINOPHEN 5-325 MG PO TABS: 1.0000 | ORAL_TABLET | Freq: Four times a day (QID) | ORAL | 0 refills | Status: DC | PRN

## 2016-09-18 ENCOUNTER — Telehealth: Payer: Self-pay | Admitting: Orthopaedic Surgery

## 2016-09-18 MED ORDER — OXYCODONE-ACETAMINOPHEN 5-325 MG PO TABS: 1.0000 | ORAL_TABLET | Freq: Four times a day (QID) | ORAL | 0 refills | Status: DC | PRN

## 2016-10-03 ENCOUNTER — Telehealth: Payer: Self-pay | Admitting: Orthopaedic Surgery

## 2016-10-08 MED ORDER — OXYCODONE-ACETAMINOPHEN 5-325 MG PO TABS: 1.0000 | ORAL_TABLET | Freq: Four times a day (QID) | ORAL | 0 refills | Status: DC | PRN

## 2016-10-21 ENCOUNTER — Ambulatory Visit (INDEPENDENT_AMBULATORY_CARE_PROVIDER_SITE_OTHER): Payer: BC Managed Care – PPO | Admitting: Orthopedic Surgery

## 2016-10-21 DIAGNOSIS — M75121 Complete rotator cuff tear or rupture of right shoulder, not specified as traumatic: Secondary | ICD-10-CM

## 2016-10-21 NOTE — Progress Notes (Signed)
Chief Complaint  Patient presents with  . Follow-up    RECHECK RIGHT SHOULDER, DISCUSS SURGERY.   IMPRESSION: 1. Large full-thickness rotator cuff tear as described involving the supraspinatus and infraspinatus tendons. There is associated tendon retraction and muscular atrophy/edema. 2. 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 3. Superior labral degeneration without evidence of tear. 4. Moderate acromioclavicular degenerative changes and subacromial -subdeltoid bursitis.     Electronically Signed   By: Carey BullocksWilliam  Veazey M.D.   On: 05/24/2016 10:03  The MRI was reviewed and the report was also reviewed.  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 As I explained to the patient this will make the repair much more difficult and may require some grafting to be done. I expect him to be out of work for 4 months and not to have full recovery for one year   DISCUSSED   Surgery for Rotator Cuff Tear  Rotator cuff surgery is only recommended for individuals who have experienced persistent disability for greater than 3 months of non-surgical (conservative) treatment. Surgery is not necessary but is recommended for individuals who experience difficulty completing daily activities or athletes who are unable to compete. Rotator cuff tears do not usually heal without surgical intervention. If left alone small rotator cuff tears usually become larger. Younger athletes who have a rotator cuff tear may be recommended for surgery without attempting conservative rehabilitation. The purpose of surgery is to regain function of the shoulder joint and eliminate pain associated with  the injury. In addition to repairing the tendon tear, the surgery may often remove a portion of the bony roof of the shoulder (acromion) as well as the chronically thickened and inflamed membrane below the acromion (subacromial bursa). REASONS NOT TO OPERATE   Infection of the shoulder.  Inability to complete a rehabilitation program.  Patients who have other conditions (emotional or psychological) conditions that contribute to their shoulder condition. RISKS AND COMPLICATIONS  Infection.  Re-tear of the rotator cuff tendons or muscles.  Shoulder stiffness and/or weakness.  Inability to compete in athletics.  Acromioclavicular [facility](AC) joint pain  Risks of surgery: infection, bleeding, nerve damage, or damage to surrounding tissues. TECHNIQUE There are different surgical procedures used to treat rotator cuff tears. The type of procedure depends on the extent of injury as well as the surgeon's preference. All of the surgical techniques for rotator cuff tears have the same goal of repairing the torn tendon, removing part of the acromion, and removing the subacromial bursa. There are two main types of procedures: arthroscopic and open incision. Arthroscopic procedures are usually completed and you go home the same day as surgery (outpatient). These procedures use multiple small incisions in which tools and a video camera are placed to work on the shoulder. An electric shaver removes the bursa, then a power burr shaves down the portion of the acromion that places pressure on the rotator cuff. Finally the rotator cuff is sewed (sutured) back to the humeral head. Open incision procedures require a larger incision. The deltoid muscle is detached from the acromion and a ligament in the shoulder (coracoacromial) is cut in order for the surgeon to access the rotator cuff. The subacromial bursa is removed as well  as part of the acromion to give the rotator cuff room to move freely. The torn tendon is then  sutured to the humeral head. After the rotator cuff is repaired, then the deltoid is reattached and the incision is closed up.  RECOVERY   Post-operative care depends on the surgical technique and the preferences of your therapist.  Keep the wound clean and dry for the first 10 to 14 days after surgery.  Keep your shoulder and arm in the sling provided to you for as long as you have been instructed to.  You will be given pain medications by your caregiver.  Passive (without using muscles) shoulder movements may be begun when instructed.  It is important to follow through with you rehabilitation program in order to have the best possible recovery. RETURN TO SPORTS   The rehabilitation period will depend on the sport and position you play as well as the success of the operation.  The minimum recovery period is 6 months.  You must have regained complete shoulder motion and strength before returning to sports. SEEK IMMEDIATE MEDICAL CARE IF:   Any medications produce adverse side effects.  Any complications from surgery occur:  Pain, numbness, or coldness in the extremity operated upon.  Discoloration of the nail beds (they become blue or gray) of the extremity operated upon.  Signs of infections (fever, pain, inflammation, redness, or persistent bleeding).   You have decided to proceed with rotator cuff repair surgery. You have decided not to continue with nonoperative measures such as but not limited to oral medication,   activity modification, physical therapy, bracing, or injection.  We will perform rotator cuff repair. Some of the risks associated with rotator cuff repair include but are not limited to Bleeding Infection Swelling Stiffness Blood clot Pain Re-tearing of the rotator cuff Failure of the rotator cuff to heal   If you're not comfortable with these risks and would like to continue with nonoperative treatment please let Dr. Romeo Apple know prior to your  surgery.  15 MIN

## 2016-10-22 ENCOUNTER — Telehealth: Payer: Self-pay | Admitting: Orthopedic Surgery

## 2016-10-22 ENCOUNTER — Other Ambulatory Visit: Payer: Self-pay | Admitting: Orthopedic Surgery

## 2016-10-22 ENCOUNTER — Other Ambulatory Visit: Payer: Self-pay | Admitting: Orthopaedic Surgery

## 2016-10-22 MED ORDER — HYDROCODONE-ACETAMINOPHEN 5-325 MG PO TABS: 1.0000 | ORAL_TABLET | Freq: Four times a day (QID) | ORAL | 0 refills | Status: DC | PRN

## 2016-10-22 NOTE — Telephone Encounter (Signed)
Routing to Dr Harrison 

## 2016-10-22 NOTE — Progress Notes (Unsigned)
[place] controlled substance reporting system reviewed  

## 2016-10-22 NOTE — Telephone Encounter (Signed)
Oxycodone-Acetaminophen  5/325mg   Qty 20 Tablets  Take 1 tablet by mouth every 6 (six) hours as needed for moderate pain or severe pain (Must last 14 days. Do not drive or operate machinery while taking this medicine).  He requested it from Dr. Hilda LiasKeeling and was denied. He wanted to ask Dr. Romeo AppleHarrison since he asked him yesterday about his pain medication. He stated he has taken his last one.

## 2016-10-29 IMAGING — DX DG SHOULDER 2+V*R*
3 series · 3 of 3 positions shown · non-contrast
Comparison: None.

CLINICAL DATA: Nontraumatic right shoulder pain.

EXAM:
RIGHT SHOULDER - 2+ VIEW

[shoulder grashey]
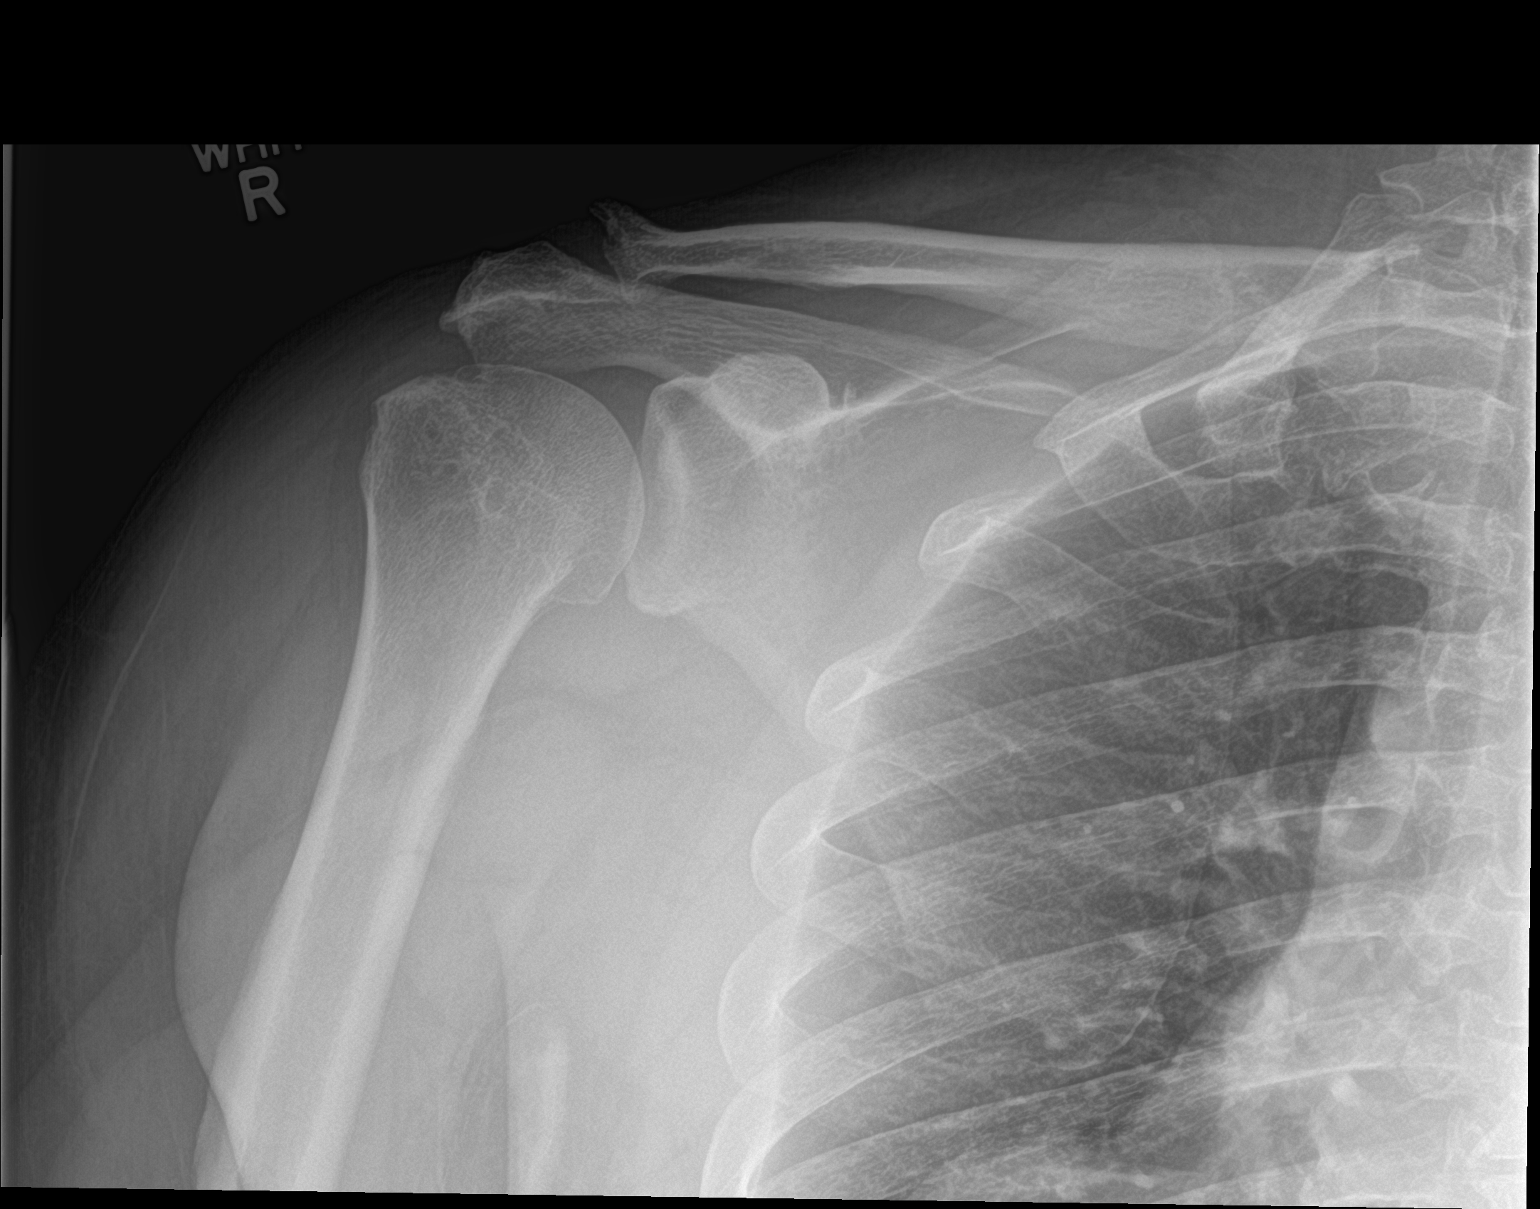

[shoulder y view]
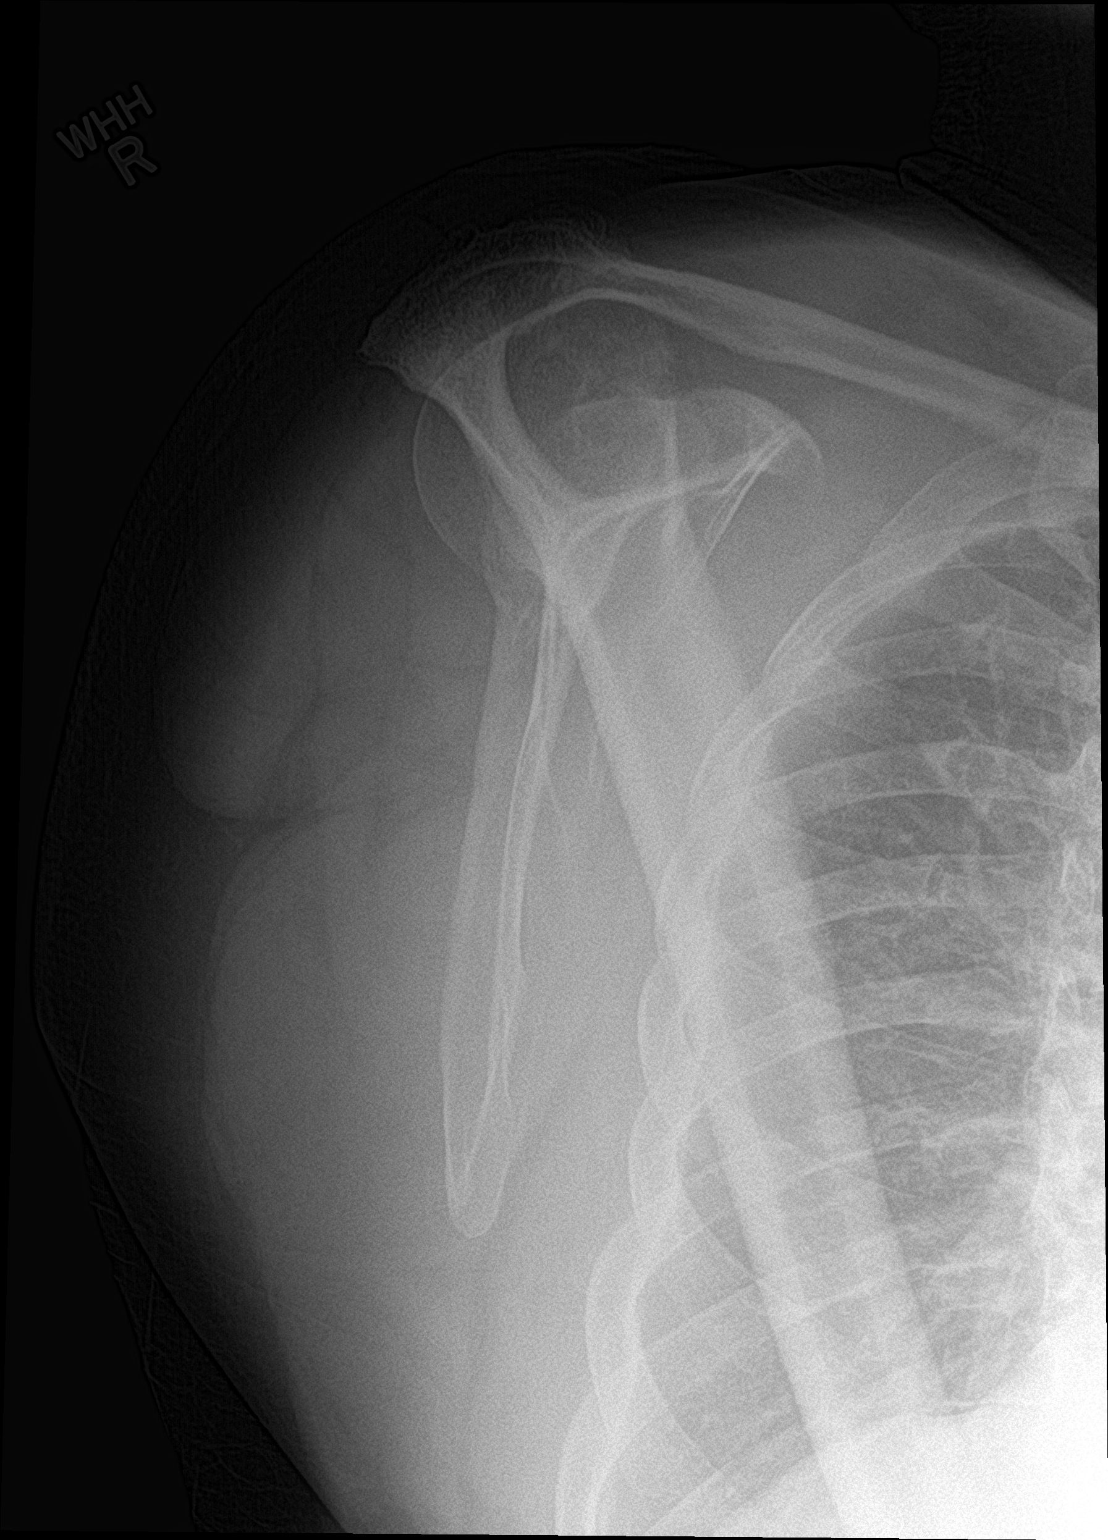

[shoulder axillary]
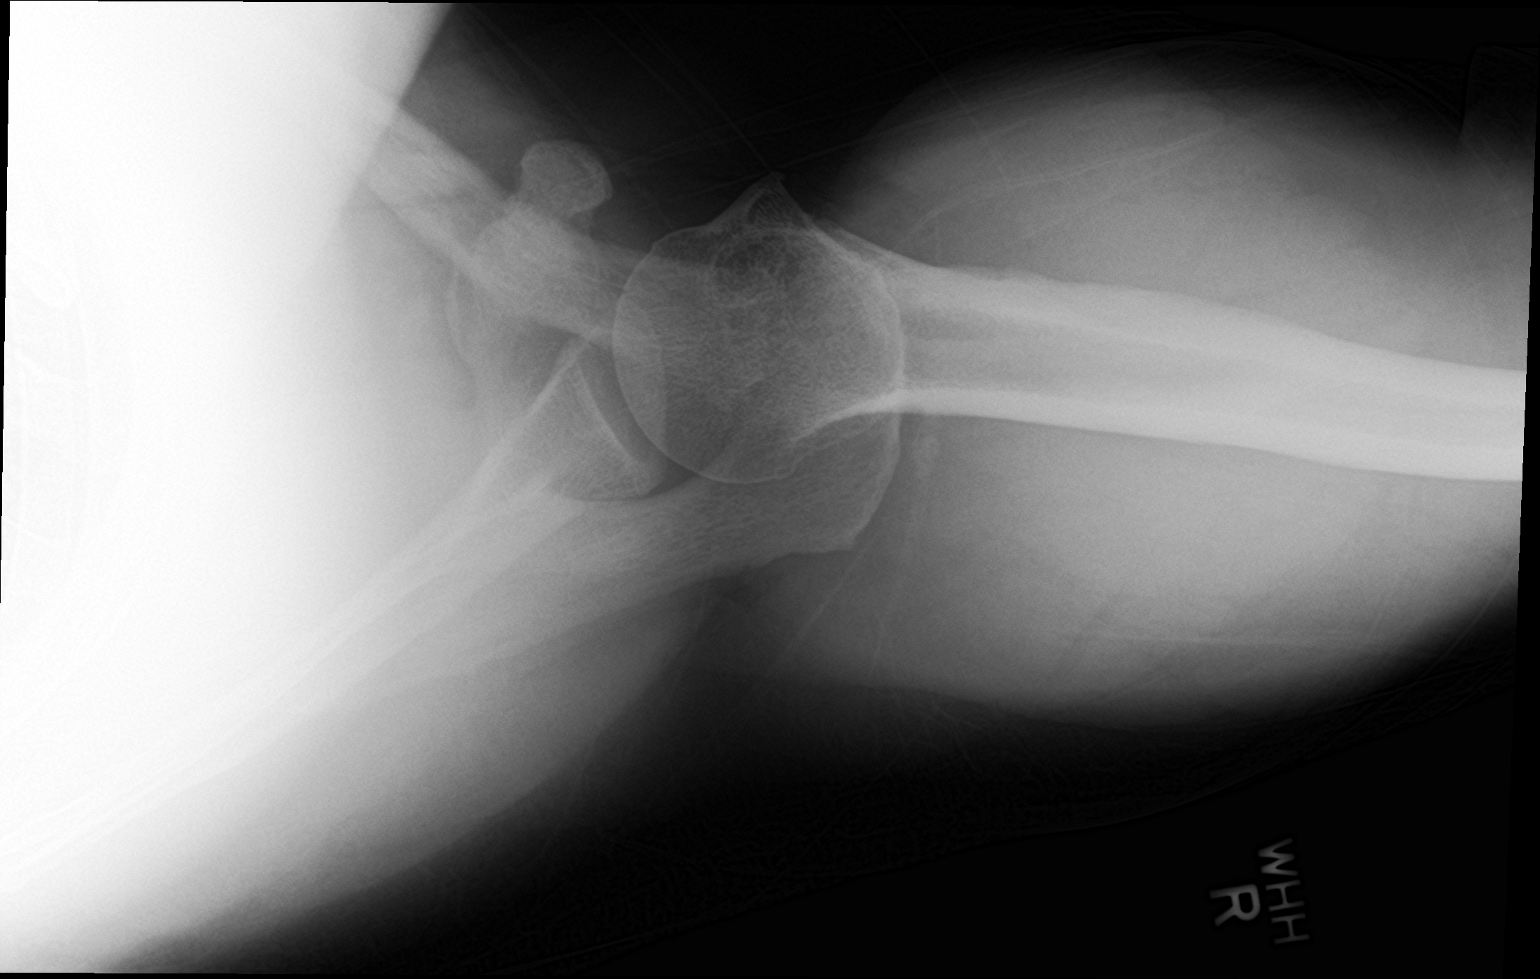

[3 of 3 positions shown; findings below may reference images not displayed]

FINDINGS: Negative for acute fracture dislocation. Moderate AC degenerative
changes are present. Glenohumeral joint is better preserved. No bone
lesion or bony destruction per
IMPRESSION: Negative for acute fracture.

## 2016-10-30 ENCOUNTER — Encounter (HOSPITAL_COMMUNITY): Payer: Self-pay | Admitting: Emergency Medicine

## 2016-10-30 ENCOUNTER — Emergency Department (HOSPITAL_COMMUNITY)
Admission: EM | Admit: 2016-10-30 | Discharge: 2016-10-31 | Disposition: A | Discharge status: Home / Self Care | Payer: BC Managed Care – PPO | Attending: Emergency Medicine | Admitting: Emergency Medicine

## 2016-10-30 DIAGNOSIS — Y999 Unspecified external cause status: Secondary | ICD-10-CM | POA: Diagnosis not present

## 2016-10-30 DIAGNOSIS — Y929 Unspecified place or not applicable: Secondary | ICD-10-CM | POA: Insufficient documentation

## 2016-10-30 DIAGNOSIS — X58XXXA Exposure to other specified factors, initial encounter: Secondary | ICD-10-CM | POA: Insufficient documentation

## 2016-10-30 DIAGNOSIS — S39012A Strain of muscle, fascia and tendon of lower back, initial encounter: Secondary | ICD-10-CM

## 2016-10-30 DIAGNOSIS — Y939 Activity, unspecified: Secondary | ICD-10-CM | POA: Insufficient documentation

## 2016-10-30 DIAGNOSIS — S3992XA Unspecified injury of lower back, initial encounter: Secondary | ICD-10-CM | POA: Diagnosis present

## 2016-10-30 MED ORDER — DIAZEPAM 5 MG PO TABS
5.0000 mg | ORAL_TABLET | Freq: Once | ORAL | Status: AC
  Administered 2016-10-30: 5 mg via ORAL
  Filled 2016-10-30: qty 1

## 2016-10-30 MED ORDER — KETOROLAC TROMETHAMINE 60 MG/2ML IM SOLN
60.0000 mg | Freq: Once | INTRAMUSCULAR | Status: AC
  Administered 2016-10-30: 60 mg via INTRAMUSCULAR
  Filled 2016-10-30: qty 2

## 2016-10-30 NOTE — ED Provider Notes (Signed)
[facility]-EMERGENCY DEPT Provider Note   CSN: [id number] Arrival date & time: 10/30/16  2245     History   Chief Complaint Chief Complaint  Patient presents with  . Back Pain    HPI Francisco Fox is a 51 y.o. male.  HPI  Francisco Fox is a 51 y.o. male who presents to the Emergency Department complaining of low back pain for one week.  He describes recurrent low back pain that occasionally radiates into his right buttock and thigh.  Pain is worse with certain movements and weight bearing.  He takes hydrocodone daily for his back pain, but recently it has not controlled the pain very well.  He denies known injury, numbness or weakness of the extremities, fever, urine or bowel changes.    History reviewed. No pertinent past medical history.  There are no active problems to display for this patient.   Past Surgical History:  Procedure Laterality Date  . CHOLECYSTECTOMY         Home Medications    Prior to Admission medications   Medication Sig Start Date End Date Taking? Authorizing Provider  dexamethasone (DECADRON) 4 MG tablet Take 1 tablet (4 mg total) by mouth 2 (two) times daily with a meal. 02/12/16   Ivery Quale, PA-C  HYDROcodone-acetaminophen (NORCO) 5-325 MG tablet Take 1 tablet by mouth every 6 (six) hours as needed for moderate pain. 10/22/16   Vickki Hearing, MD  meloxicam (MOBIC) 15 MG tablet TAKE 1 TABLET BY MOUTH EVERY DAY 04/10/16   Darreld Mclean, MD  methocarbamol (ROBAXIN) 500 MG tablet Take 1 tablet (500 mg total) by mouth 3 (three) times daily. 02/12/16   Ivery Quale, PA-C  naproxen (NAPROSYN) 500 MG tablet TAKE 1 TABLET(500 MG) BY MOUTH TWICE DAILY WITH A MEAL 08/26/16   Darreld Mclean, MD  omeprazole (PRILOSEC) 40 MG capsule Take 40 mg by mouth daily.    Historical Provider, MD  oxyCODONE-acetaminophen (PERCOCET/ROXICET) 5-325 MG tablet Take 1 tablet by mouth every 6 (six) hours as needed for moderate pain or severe pain (Must last 14 days.Do not  drive or operate machinery while taking this medicine). 10/08/16   Darreld Mclean, MD    Family History No family history on file.  Social History Social History  Substance Use Topics  . Smoking status: Never Smoker  . Smokeless tobacco: Never Used  . Alcohol use Yes     Comment: occasional     Allergies   Patient has no known allergies.   Review of Systems Review of Systems  Constitutional: Negative for fever.  Respiratory: Negative for shortness of breath.   Gastrointestinal: Negative for abdominal pain, constipation and vomiting.  Genitourinary: Negative for decreased urine volume, difficulty urinating, dysuria, flank pain and hematuria.  Musculoskeletal: Positive for back pain. Negative for joint swelling.  Skin: Negative for rash.  Neurological: Negative for weakness and numbness.  All other systems reviewed and are negative.    Physical Exam Updated Vital Signs BP 149/83   Pulse 81   Temp 97.6 F (36.4 C)   Resp 20   Ht 5\' 4"  (1.626 m)   Wt 111.1 kg   SpO2 100%   BMI 42.05 kg/m   Physical Exam  Constitutional: He is oriented to person, place, and time. He appears well-developed and well-nourished. No distress.  HENT:  Head: Normocephalic and atraumatic.  Neck: Normal range of motion. Neck supple.  Cardiovascular: Normal rate, regular rhythm and intact distal pulses.   No murmur heard. Pulmonary/Chest:  Effort normal and breath sounds normal. No respiratory distress.  Abdominal: Soft. He exhibits no distension. There is no tenderness.  Musculoskeletal: He exhibits tenderness. He exhibits no edema.       Lumbar back: He exhibits tenderness and pain. He exhibits normal range of motion, no swelling, no deformity, no laceration and normal pulse.  Focal ttp of the right lumbar paraspinal muscles.  No spinal tenderness.   Distal sensation intact.  Pt has 5/5 strength against resistance of bilateral lower extremities.  Neg SLR bilaterally   Neurological: He is  alert and oriented to person, place, and time. He has normal strength. No sensory deficit. He exhibits normal muscle tone. Coordination and gait normal.  Skin: Skin is warm and dry. No rash noted.  Nursing note and vitals reviewed.    ED Treatments / Results  Labs (all labs ordered are listed, but only abnormal results are displayed) Labs Reviewed - No data to display  EKG  EKG Interpretation None       Radiology No results found.  Procedures Procedures (including critical care time)  Medications Ordered in ED Medications  ketorolac (TORADOL) injection 60 mg (60 mg Intramuscular Given 10/30/16 2347)  diazepam (VALIUM) tablet 5 mg (5 mg Oral Given 10/30/16 2346)     Initial Impression / Assessment and Plan / ED Course  I have reviewed the triage vital signs and the nursing notes.  Pertinent labs & imaging results that were available during my care of the patient were reviewed by me and considered in my medical decision making (see chart for details).     Pt is well appearing.  Focal tenderness of the right low back.  No associated sx's.  No focal neuro deficits.  Likely musculoskeletal pain.  Pain addressed here with valium and toradol.  Has hydrocodone at home.  Ambulates with steady gait.  Has upcoming appt with orthopedics.  Appears stable for d/c.  Rx for muscle relaxer.   Final Clinical Impressions(s) / ED Diagnoses   Final diagnoses:  Strain of lumbar region, initial encounter    New Prescriptions Discharge Medication List as of 10/31/2016 12:02 AM    START taking these medications   Details  cyclobenzaprine (FLEXERIL) 10 MG tablet Take 1 tablet (10 mg total) by mouth 3 (three) times daily as needed., Starting Wed 10/30/2016, [employer]         Marleah Beever, PA-C 11/02/16 1519    Devoria AlbeIva Knapp, MD 11/03/16 2043

## 2016-10-30 NOTE — ED Triage Notes (Signed)
Pt c/o lower right back pain x one week. Pt denies any injury.

## 2016-10-31 MED ORDER — CYCLOBENZAPRINE HCL 10 MG PO TABS: 10.0000 mg | ORAL_TABLET | Freq: Three times a day (TID) | ORAL | 0 refills | Status: DC | PRN

## 2016-10-31 NOTE — Discharge Instructions (Signed)
Alternate ice and heat to your lower back.  Avoid bending or twisting movements for one week.  Follow-up with your primary doctor or with orthopedics if not improving

## 2016-11-07 ENCOUNTER — Encounter: Payer: Self-pay | Admitting: *Deleted

## 2016-11-07 NOTE — Progress Notes (Signed)
Per patient plan, pre authorization not required for cpt 23412 outpatient service Reference Kayla S. 11/07/16 2:44pm

## 2016-11-07 NOTE — Patient Instructions (Signed)
Francisco Fox  11/07/2016     @[email or web address] @   Your procedure is scheduled on  11/14/2016   Report to [facility] at  830  A.M.  Call this number if you have problems the morning of surgery:  [phone]   Remember:  Do not eat food or drink liquids after midnight.  Take these medicines the morning of surgery with A SIP OF WATER  Flexaril, decadron, hydrocodone or oxycodone, mobic, robaxin, prilosec.   Do not wear jewelry, make-up or nail polish.  Do not wear lotions, powders, or perfumes, or deoderant.  Do not shave 48 hours prior to surgery.  Men may shave face and neck.  Do not bring valuables to the hospital.  [facility] is not responsible for any belongings or valuables.  Contacts, dentures or bridgework may not be worn into surgery.  Leave your suitcase in the car.  After surgery it may be brought to your room.  For patients admitted to the hospital, discharge time will be determined by your treatment team.  Patients discharged the day of surgery will not be allowed to drive home.   Name and phone number of your driver:   family Special instructions:  None  Please read over the following fact sheets that you were given. 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 These instructions provide you with information about caring for yourself after your procedure. Your health care provider may also give you more specific instructions. Your treatment has been planned according to current medical practices, but problems sometimes occur. Call your health care provider if you have any problems or questions after your procedure. What can I expect after the procedure? After the procedure, it is common to have:  Pain.  Tenderness.  Stiffness.  Bruising.  Swelling. Follow these instructions at home:   If you have  a sling:  Wear the sling as told by your health care provider. Remove it only as told by your health care provider.  If your sling has a pillow, do not remove it unless told by your health care provider.  Loosen the sling if your fingers tingle, become numb, or turn cold and blue.  Do not let your sling get wet if it is not waterproof.  Keep the sling clean. Bathing  Do not take baths, swim, or use a hot tub until your health care provider approves. Ask your health care provider if you can take showers. You may only be allowed to take sponge baths for bathing.  Keep your bandage (dressing) dry until your health care provider says it can be removed. Incision care  Follow instructions from your health care provider about how to take care of your incision. Make sure you:  Wash your hands with soap and water before you change your dressing. If soap and water are not available, use hand sanitizer.  Change your dressing as told by your health care provider.  Leave stitches (sutures), skin glue, or adhesive strips in place. These skin closures may need to stay in place for 2 weeks or longer. If adhesive strip edges start to loosen and curl up, you may trim the loose edges. Do not remove adhesive strips completely unless your health care provider tells you to do that.  Check your incision area every day  for signs of infection. Check for:  More redness, swelling, or pain.  More fluid or blood.  Warmth.  Pus or a bad smell. Managing pain, stiffness, and swelling  If directed, put ice on your shoulder area.  Put ice in a plastic bag.  Place a towel between your skin and the bag.  Leave the ice on for 20 minutes, 2-3 times a day.  Move your fingers often to avoid stiffness and to lessen swelling.  Raise (elevate) your upper body on pillows when you lie down and when you sleep.  Do not sleep on the front of your body (abdomen).  Do not sleep on the side that your surgery was  performed on. Driving  Do not drive for 24 hours if you received a medicine to help you relax (sedative) during your procedure.  Do not drive or operate heavy machinery while taking prescription pain medicine.  Ask your health care provider when it is safe for you to drive. Activity  Do not use your arm to support your body weight until your health care provider approves.  Do not lift or hold anything with your arm until your health care provider approves.  Return to your normal activities as told by your health care provider. Ask your health care provider what activities are safe for you.  Do exercises as told by your health care provider. General instructions  Do not use any tobacco products, such as cigarettes, chewing tobacco, and e-cigarettes. Tobacco can delay healing. If you need help quitting, ask your health care provider.  Take over-the-counter and prescription medicines only as told by your health care provider.  If you were prescribed an antibiotic medicine, take it as told by your health care provider. Do not stop taking the antibiotic even if you start to feel better.  Keep all follow-up visits as told by your health care provider. This is important. Contact a health care provider if:  You have a fever.  You have more redness, swelling, or pain around your incision.  You have more fluid or blood coming from your incision.  Your incision feels warm to the touch.  You have pus or a bad smell coming from your incision.  You develop tingling, numbness, or coldness in your arm or hand.  You have pain that gets worse or does not get better with medicine. Get help right away if:  You have severe pain.  You lose feeling in your arm or hand.  Your hand or fingers turn very pale or blue. This information is not intended to replace advice given to you by your health care provider. Make sure you discuss any questions you have with your health care provider. Document  Released: 09/23/2005 Document Revised: 05/28/2016 Document Reviewed: 10/07/2015 Elsevier Interactive Patient Education  2017 [employer]  Surgery for Shoulder Impingement Syndrome, Care After Introduction Refer to this sheet in the next few weeks. These instructions provide you with information about caring for yourself after your procedure. Your health care provider may also give you more specific instructions. Your treatment has been planned according to current medical practices, but problems sometimes occur. Call your health care provider if you have any problems or questions after your procedure. What can I expect after the procedure? After your procedure, it is common to have pain and stiffness in your shoulder area. This may include your arm, chest, and back. Follow these instructions at home: If you have a sling:  Wear the sling as told by your  health care provider. Remove it only as told by your health care provider.  Loosen the sling if your fingers tingle, become numb, or turn cold and blue.  Do not let your sling get wet if it is not waterproof.  Keep the sling clean. Bathing  Do not take baths, swim, or use a hot tub until your health care provider approves. Ask your health care provider if you can take showers. You may only be allowed to take sponge baths for bathing.  If you have a sling that is not waterproof, cover it with a watertight covering when you take a bath or a shower.  Keep your bandage (dressing) dry until your health care provider says it can be removed. Incision care  Check your incision area every day for signs of infection. Check for:  More redness, swelling, or pain.  More fluid or blood.  Warmth.  Pus or a bad smell.  Follow instructions from your health care provider about how to take care of your incision. Make sure you:  Wash your hands with soap and water before you change your bandage (dressing). If soap and water are not available, use  hand sanitizer.  Change your dressing as told by your health care provider.  Leave stitches (sutures), skin glue, or adhesive strips in place. These skin closures may need to be in place for 2 weeks or longer. If adhesive strip edges start to loosen and curl up, you may trim the loose edges. Do not remove adhesive strips completely unless your health care provider tells you to do that. Driving  Do not drive or operate heavy machinery while taking prescription pain medicine.  Do not drive for 24 hours if you received a sedative.  Ask your health care provider when it is safe to drive if you have a sling on your arm. Managing pain, stiffness, and swelling  Move your fingers often to avoid stiffness and to lessen swelling.  Keep your arm and shoulder in the position recommended by your health care provider.  If directed, put ice on your shoulder.  Put ice in a plastic bag.  Place a towel between your skin and the bag.  Leave the ice on for 20 minutes, 2-3 times a day. Activity  Return to your normal activities as told by your health care provider. Ask your health care provider what activities are safe for you.  Do not lift anything that is heavier than 10 lb (4.5 kg) until your health care provider tells you that it is safe.  Do exercises and stretches as told by your health care provider. General instructions  Take over-the-counter and prescription medicines only as told by your health care provider.  Do not use any tobacco products, such as cigarettes, chewing tobacco, and e-cigarettes. If you need help quitting, ask your health care provider.  Keep all follow-up visits as told by your health care provider. This is important. Contact a health care provider if:  You have a fever.  You have pain that gets worse or does not get better with medicine.  You have more redness, swelling, or pain around your incision.  You have more fluid or blood coming from your  incision.  Your incision feels warm to the touch.  You have pus or a bad smell coming from your incision.  You have muscle aches.  You are dizzy. Get help right away if:  You have severe pain in your shoulder, arm, or hand.  Your hand or arm  becomes numb.  Your hand or arm feels unusually cold.  Your fingernails turn a dark color, such as blue or gray. This information is not intended to replace advice given to you by your health care provider. Make sure you discuss any questions you have with your health care provider. Document Released: 01/15/2016 Document Revised: 02/29/2016 Document Reviewed: 09/09/2015  2017 [employer]  Rotator Cuff Tear Rehab After Surgery Ask your health care provider which exercises are safe for you. Do exercises exactly as told by your health care provider and adjust them as directed. It is normal to feel mild stretching, pulling, tightness, or discomfort as you do these exercises, but you should stop right away if you feel sudden pain or your pain gets worse. Do not begin these exercises until told by your health care provider. Stretching and range of motion exercises These exercises warm up your muscles and joints and improve the movement and flexibility of your shoulder. These exercises also help to relieve pain, numbness, and tingling. Exercise A: Pendulum 1. Stand near a wall or a surface that you can hold onto for balance. 2. Bend at the waist and let your left / right arm hang straight down. Use your other arm to keep your balance. 3. Relax your arm and shoulder muscles, and move your hips and your trunk so your left / right arm swings freely. Your arm should swing because of the motion of your body, not because you are using your arm or shoulder muscles. 4. Keep moving so your arm swings in the following directions, as told by your health care provider:  Side to side.  Forward and backward.  In clockwise and counterclockwise circles. Repeat  __________ times, or for __________ seconds per direction. Complete this exercise __________ times a day. Exercise B: Flexion, seated 1. Sit in a stable chair so your left / right forearm can rest on a flat surface. Your elbow should rest at a height that keeps your upper arm next to your body. 2. Keeping your shoulder relaxed, lean forward at the waist and let your hand slide forward. Stop when you feel a stretch in your shoulder, or when you reach the angle that is recommended by your health care provider. 3. Hold for __________ seconds. 4. Slowly return to the starting position. Repeat __________ times. Complete this exercise __________ times a day. Exercise C: Flexion, standing 1. Stand and hold a broomstick, a cane, or a similar object. Place your hands a little more than shoulder-width apart on the object. Your left / right hand should be palm-up, and your other hand should be palm-down. 2. Push the stick down with your healthy arm to raise your left / right arm in front of your body, and then over your head. Use your other hand to help move the stick. Stop when you feel a stretch in your shoulder, or when you reach the angle that is recommended by your health care provider.  Avoid shrugging your shoulder while you raise your arm. Keep your shoulder blade tucked down toward your spine.  Keep your left / right shoulder muscles relaxed. 3. Hold for __________ seconds. 4. Slowly return to the starting position. Repeat __________ times. Complete this exercise __________ times a day. Exercise D: Abduction, supine 1. Lie on your back and hold a broomstick, a cane, or a similar object. Place your hands a little more than shoulder-width apart on the object. Your left / right hand should be palm-up, and your other hand should  be palm-down. 2. Push the stick to raise your left / right arm out to your side and then over your head. Use your other hand to help move the stick. Stop when you feel a stretch  in your shoulder, or when you reach the angle that is recommended by your health care provider.  Avoid shrugging your shoulder while you raise your arm. Keep your shoulder blade tucked down toward your spine. 3. Hold for __________ seconds. 4. Slowly return to the starting position. Repeat __________ times. Complete this exercise __________ times a day. Exercise E: Shoulder flexion, active-assisted 1. Lie on your back. You may bend your knees for comfort. 2. Hold a broomstick, a cane, or a similar object so your hands are about shoulder-width apart. Your palms should face toward your feet. 3. Raise your left / right arm over your head and behind your head, toward the floor. Use your other hand to help you do this. Stop when you feel a gentle stretch in your shoulder, or when you reach the angle that is recommended by your health care provider. 4. Hold for __________ seconds. 5. Use the broomstick and your other arm to help you return your left / right arm to the starting position. Repeat __________ times. Complete this exercise __________ times a day. Exercise F: External rotation 1. Sit in a stable chair without armrests, or stand. 2. Tuck a soft object, such as a folded towel or a small ball, under your left / right upper arm. 3. Hold a broomstick, a cane, or a similar object so your palms face down, toward the floor. Bend your elbows to an "L" shape (90 degrees), and keep your hands about shoulder-width apart. 4. Straighten your healthy arm and push the broomstick across your body, toward your left / right side. Keep your left / right arm bent. This will rotate your left / right forearm away from your body. 5. Hold for __________ seconds. 6. Slowly return to the starting position. Repeat __________ times. Complete this exercise __________ times a day. Strengthening exercises These exercises build strength and endurance in your shoulder. Endurance is the ability to use your muscles for a long  time, even after they get tired. Exercise G: Shoulder flexion, isometric 1. Stand or sit about 4-6 inches (10-15 cm) away from a wall with your left / right side facing the wall. 2. Gently make a fist and place your left / right hand on the wall so the top of your fist touches the wall. 3. With your left / right elbow straight, gently press the top of your fist into the wall. Gradually increase the pressure until you are pressing as hard as you can without shrugging your shoulder. 4. Hold for __________ seconds. 5. Slowly release the tension and relax your muscles completely before you repeat the exercise. Repeat __________ times. Complete this exercise __________ times a day. Exercise H: Shoulder abduction, isometric 1. Stand or sit about 4-6 inches (10-15 cm) away from a wall with your right/left side facing the wall. 2. Bend your left / right elbow and gently press your elbow into the wall as if you are trying to move your arm out to your side. Increase the pressure gradually until you are pressing as hard as you can without shrugging your shoulder. 3. Hold for __________ seconds. 4. Slowly release the tension and relax your muscles completely before repeating the exercise. Repeat __________ times. Complete this exercise __________ times a day. Exercise I: Internal rotation, isometric  1. Stand or sit in a doorway, facing the door frame. 2. Bend your left / right elbow and place the palm of your hand against the door frame. Only your palm should be touching the frame. Keep your upper arm at your side. 3. Gently press your hand into the door frame, as if you are trying to push your arm toward your abdomen. Do not let your wrist bend.  Avoid shrugging your shoulder while you press your hand into the door frame. Keep your shoulder blade tucked down toward the middle of your back. 4. Hold for __________ seconds. 5. Slowly release the tension, and relax your muscles completely before you repeat the  exercise. Repeat __________ times. Complete this exercise __________ times a day. Exercise J: External rotation, isometric 1. Stand or sit in a doorway, facing the door frame. 2. Bend your left / right elbow and place the back of your wrist against the door frame. Only the back of your wrist should be touching the frame. Keep your upper arm at your side. 3. Gently press your wrist against the door frame, as if you are trying to push your arm away from your abdomen.  Avoid shrugging your shoulder while you press your wrist into the door frame. Keep your shoulder blade tucked down toward the middle of your back. 4. Hold for __________ seconds. 5. Slowly release the tension, and relax your muscles completely before you repeat the exercise. Repeat __________ times. Complete this exercise __________ times a day. This information is not intended to replace advice given to you by your health care provider. Make sure you discuss any questions you have with your health care provider. Document Released: 09/23/2005 Document Revised: 05/30/2016 Document Reviewed: 10/07/2015 [employer] Interactive Patient Education  2017 [employer] PATIENT INSTRUCTIONS POST-ANESTHESIA  IMMEDIATELY FOLLOWING SURGERY:  Do not drive or operate machinery for the first twenty four hours after surgery.  Do not make any important decisions for twenty four hours after surgery or while taking narcotic pain medications or sedatives.  If you develop intractable nausea and vomiting or a severe headache please notify your doctor immediately.  FOLLOW-UP:  Please make an appointment with your surgeon as instructed. You do not need to follow up with anesthesia unless specifically instructed to do so.  WOUND CARE INSTRUCTIONS (if applicable):  Keep a dry clean dressing on the anesthesia/puncture wound site if there is drainage.  Once the wound has quit draining you may leave it open to air.  Generally you should leave the bandage intact  for twenty four hours unless there is drainage.  If the epidural site drains for more than 36-48 hours please call the anesthesia department.  QUESTIONS?:  Please feel free to call your physician or the hospital operator if you have any questions, and they will be happy to assist you.

## 2016-11-08 ENCOUNTER — Encounter (HOSPITAL_COMMUNITY)
Admission: RE | Admit: 2016-11-08 | Discharge: 2016-11-08 | Disposition: A | Discharge status: Home / Self Care | Payer: BC Managed Care – PPO | Source: Ambulatory Visit | Attending: Orthopedic Surgery | Admitting: Orthopedic Surgery

## 2016-11-08 ENCOUNTER — Encounter (HOSPITAL_COMMUNITY): Payer: Self-pay

## 2016-11-08 DIAGNOSIS — M75101 Unspecified rotator cuff tear or rupture of right shoulder, not specified as traumatic: Secondary | ICD-10-CM | POA: Insufficient documentation

## 2016-11-08 DIAGNOSIS — Z01818 Encounter for other preprocedural examination: Secondary | ICD-10-CM | POA: Diagnosis not present

## 2016-11-08 HISTORY — DX: Unspecified osteoarthritis, unspecified site: M19.90

## 2016-11-08 HISTORY — DX: Gastro-esophageal reflux disease without esophagitis: K21.9

## 2016-11-08 LAB — CBC WITH DIFFERENTIAL/PLATELET
Basophils Absolute: 0 10*3/uL (ref 0.0–0.1)
Basophils Relative: 1 %
EOS ABS: 0.3 10*3/uL (ref 0.0–0.7)
Eosinophils Relative: 4 %
HEMATOCRIT: 39.5 % (ref 39.0–52.0)
HEMOGLOBIN: 13.3 g/dL (ref 13.0–17.0)
LYMPHS ABS: 2.5 10*3/uL (ref 0.7–4.0)
Lymphocytes Relative: 43 %
MCH: 29.4 pg (ref 26.0–34.0)
MCHC: 33.7 g/dL (ref 30.0–36.0)
MCV: 87.2 fL (ref 78.0–100.0)
MONOS PCT: 7 %
Monocytes Absolute: 0.4 10*3/uL (ref 0.1–1.0)
NEUTROS ABS: 2.7 10*3/uL (ref 1.7–7.7)
NEUTROS PCT: 45 %
Platelets: 286 10*3/uL (ref 150–400)
RBC: 4.53 MIL/uL (ref 4.22–5.81)
RDW: 12.5 % (ref 11.5–15.5)
WBC: 5.9 10*3/uL (ref 4.0–10.5)

## 2016-11-08 LAB — BASIC METABOLIC PANEL
Anion gap: 9 (ref 5–15)
BUN: 19 mg/dL (ref 6–20)
CHLORIDE: 100 mmol/L — AB (ref 101–111)
CO2: 24 mmol/L (ref 22–32)
Calcium: 9.4 mg/dL (ref 8.9–10.3)
Creatinine, Ser: 0.98 mg/dL (ref 0.61–1.24)
GFR calc non Af Amer: >60 mL/min (ref >60)
Glucose, Bld: 134 mg/dL — ABNORMAL HIGH (ref 65–99)
POTASSIUM: 3.8 mmol/L (ref 3.5–5.1)
SODIUM: 133 mmol/L — AB (ref 135–145)

## 2016-11-11 ENCOUNTER — Encounter: Payer: Self-pay | Admitting: Orthopedic Surgery

## 2016-11-13 NOTE — H&P (Signed)
Chief Complaint  Patient presents with  . Shoulder Injury      right rotator cuff tear    HPI  Chief complaint right shoulder pain     51 year old male referred to me by Dr. Hilda Lias after trauma to the right shoulder. An adequate trial of nonoperative treatment was instituted and was unsuccessful. The patient had MRI on August 18 of this year and it showed a full-thickness rotator cuff tear involving supraspinatus and infraspinatus tendons with intra-articular tendinosis of the biceps before meals joint arthrosis moderate as well as atrophy in the muscles of the infraspinatus and supraspinatus and edema but no significant fat infiltration was observed.   "Francisco Fox is a 51 y.o. male who was playing football on May 6 and fell and hit his right shoulder. He is right hand dominant.  He went to the ER yesterday, May 8th.  X-rays were negative.  He was told he had a strain.  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  He has no paresthesias.  He has no redness or swelling of the right shoulder.  He has pain with motion overhead.  He has tired the medicine from the ER and ice with only slightly helping."     Review of Systems  Constitutional: Negative for chills and fever.  Respiratory: Negative for shortness of breath.   Cardiovascular: Negative for chest pain.  Musculoskeletal: Negative for myalgias.  Review of Systems  All other systems reviewed and are negative.      No past medical history reported by the patient no diabetes no hypertension         Past Surgical History:  Procedure Laterality Date  . CHOLECYSTECTOMY        Family history he denies family history of anesthetic problems or bleeding         Social History   Substance Use Topics   . Smoking status: Never Smoker   . Smokeless tobacco: Never Used   . Alcohol use Yes        Comment: occasional       Current Outpatient Prescriptions:  .  dexamethasone (DECADRON) 4 MG tablet, Take 1 tablet  (4 mg total) by mouth 2 (two) times daily with a meal., Disp: 12 tablet, Rfl: 0 .  meloxicam (MOBIC) 15 MG tablet, TAKE 1 TABLET BY MOUTH EVERY DAY, Disp: 30 tablet, Rfl: 5 .  methocarbamol (ROBAXIN) 500 MG tablet, Take 1 tablet (500 mg total) by mouth 3 (three) times daily., Disp: 21 tablet, Rfl: 0 .  naproxen (NAPROSYN) 500 MG tablet, Take 1 tablet (500 mg total) by mouth 2 (two) times daily with a meal., Disp: 60 tablet, Rfl: 5 .  omeprazole (PRILOSEC) 40 MG capsule, Take 40 mg by mouth daily., Disp: , Rfl:  .  oxyCODONE-acetaminophen (PERCOCET/ROXICET) 5-325 MG tablet, Take 1 tablet by mouth every 4 (four) hours as needed for moderate pain or severe pain (Must last 15 days.  Do not drive or operate machinery while taking this medicine)., Disp: 60 tablet, Rfl: 0   There were no vitals taken for this visit.   Physical Exam  Constitutional: He is oriented to person, place, and time. He appears well-developed and well-nourished. No distress.  Cardiovascular: Normal rate and intact distal pulses.   Neurological: He is alert and oriented to person, place, and time.  Skin: Skin is warm and dry. No rash noted. He is not diaphoretic. No erythema. No pallor.  Psychiatric: He  has a normal mood and affect. His behavior is normal. Judgment and thought content normal.      Ortho Exam  Lower extremities no contracture subluxation atrophy tremor or malalignment  Right shoulder Tenderness on palpation over the rotator interval a right shoulder. He has excellent passive range of motion with painful range of motion after 120 of flexion he has weakness the supraspinatus and infraspinatus tendons. The shoulder remain stable.   Left shoulder no tenderness to palpationand range of motion is normal strength is excellent and skin is intact there is no instability.    ASSESSMENT: My personal interpretation of the images:  The plain film shows cyst formation in the greater tuberosity normal acromion and normal  glenoid with before meals joint arthrosis inferior spur and undersurface sclerosis of the acromion   IMPRESSION: 1. Large full-thickness rotator cuff tear as described involving the supraspinatus and infraspinatus tendons. There is associated tendon retraction and muscular atrophy/edema. 2. 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 3. Superior labral degeneration without evidence of tear. 4. Moderate acromioclavicular degenerative changes and subacromial -subdeltoid bursitis.     Electronically Signed   By: Carey BullocksWilliam  Veazey M.D.   On: 05/24/2016 10:03   The MRI was reviewed and the report was also reviewed.   The patient has a large rotator cuff tear involving supraspinatus infraspinatus tendons and there is muscle atrophy and the biceps tendon is noted to have tendinitis but no tear there is some labral degeneration expected for age and then we have acromial clavicular arthritis and subdeltoid bursitis   The tear is more the musculotendinous junction than the tendon. As I explained to the patient this will make the repair much more difficult and may require some grafting to be done. I expect him to be out of work for 4 months and not to have full recovery for one year   Final diagnosis massive tear rotator cuff right shoulder with retraction muscle atrophy and acromioclavicular arthritis biceps tendinitis  Open rotator cuff repair with possible graft right shoulder

## 2016-11-14 ENCOUNTER — Ambulatory Visit (HOSPITAL_COMMUNITY): Payer: BC Managed Care – PPO | Admitting: Anesthesiology

## 2016-11-14 ENCOUNTER — Ambulatory Visit (HOSPITAL_COMMUNITY)
Admission: RE | Admit: 2016-11-14 | Discharge: 2016-11-14 | Disposition: A | Discharge status: Home / Self Care | Payer: BC Managed Care – PPO | Source: Ambulatory Visit | Attending: Orthopedic Surgery | Admitting: Orthopedic Surgery

## 2016-11-14 ENCOUNTER — Encounter (HOSPITAL_COMMUNITY)
Admission: RE | Disposition: A | Discharge status: Home / Self Care | Payer: Self-pay | Source: Ambulatory Visit | Attending: Orthopedic Surgery

## 2016-11-14 ENCOUNTER — Encounter (HOSPITAL_COMMUNITY): Payer: Self-pay

## 2016-11-14 DIAGNOSIS — M75121 Complete rotator cuff tear or rupture of right shoulder, not specified as traumatic: Secondary | ICD-10-CM | POA: Diagnosis present

## 2016-11-14 DIAGNOSIS — M62511 Muscle wasting and atrophy, not elsewhere classified, right shoulder: Secondary | ICD-10-CM | POA: Insufficient documentation

## 2016-11-14 DIAGNOSIS — K219 Gastro-esophageal reflux disease without esophagitis: Secondary | ICD-10-CM | POA: Insufficient documentation

## 2016-11-14 DIAGNOSIS — M7551 Bursitis of right shoulder: Secondary | ICD-10-CM | POA: Insufficient documentation

## 2016-11-14 DIAGNOSIS — M19011 Primary osteoarthritis, right shoulder: Secondary | ICD-10-CM | POA: Diagnosis not present

## 2016-11-14 DIAGNOSIS — M7521 Bicipital tendinitis, right shoulder: Secondary | ICD-10-CM | POA: Diagnosis not present

## 2016-11-14 HISTORY — PX: SHOULDER OPEN ROTATOR CUFF REPAIR: SHX2407

## 2016-11-14 SURGERY — REPAIR, ROTATOR CUFF, OPEN
Anesthesia: General | Site: Shoulder | Laterality: Right

## 2016-11-14 MED ORDER — LIDOCAINE HCL (PF) 1 % IJ SOLN
INTRAMUSCULAR | Status: AC
  Filled 2016-11-14: qty 5

## 2016-11-14 MED ORDER — PREGABALIN 50 MG PO CAPS
ORAL_CAPSULE | ORAL | Status: AC
  Filled 2016-11-14: qty 1

## 2016-11-14 MED ORDER — ROCURONIUM BROMIDE 50 MG/5ML IV SOLN
INTRAVENOUS | Status: AC
  Filled 2016-11-14: qty 1

## 2016-11-14 MED ORDER — LIDOCAINE HCL (CARDIAC) 10 MG/ML IV SOLN
INTRAVENOUS | Status: DC | PRN
  Administered 2016-11-14: 50 mg via INTRAVENOUS

## 2016-11-14 MED ORDER — SUCCINYLCHOLINE 20MG/ML (10ML) SYRINGE FOR MEDFUSION PUMP - OPTIME
INTRAMUSCULAR | Status: DC | PRN
  Administered 2016-11-14: 130 mg via INTRAVENOUS

## 2016-11-14 MED ORDER — FENTANYL CITRATE (PF) 100 MCG/2ML IJ SOLN
INTRAMUSCULAR | Status: AC
  Filled 2016-11-14: qty 2

## 2016-11-14 MED ORDER — 0.9 % SODIUM CHLORIDE (POUR BTL) OPTIME
TOPICAL | Status: DC | PRN
  Administered 2016-11-14 (×2): 1000 mL

## 2016-11-14 MED ORDER — ROCURONIUM 10MG/ML (10ML) SYRINGE FOR MEDFUSION PUMP - OPTIME
INTRAVENOUS | Status: DC | PRN
  Administered 2016-11-14: 32 mg via INTRAVENOUS
  Administered 2016-11-14: 8 mg via INTRAVENOUS
  Administered 2016-11-14: 10 mg via INTRAVENOUS

## 2016-11-14 MED ORDER — METHOCARBAMOL 1000 MG/10ML IJ SOLN
500.0000 mg | Freq: Once | INTRAVENOUS | Status: AC
  Administered 2016-11-14: 500 mg via INTRAVENOUS
  Filled 2016-11-14: qty 5

## 2016-11-14 MED ORDER — ONDANSETRON HCL 4 MG/2ML IJ SOLN
INTRAMUSCULAR | Status: AC
  Filled 2016-11-14: qty 2

## 2016-11-14 MED ORDER — GLYCOPYRROLATE 0.2 MG/ML IJ SOLN
INTRAMUSCULAR | Status: DC | PRN
  Administered 2016-11-14: 0.6 mg via INTRAVENOUS

## 2016-11-14 MED ORDER — MIDAZOLAM HCL 2 MG/2ML IJ SOLN
1.0000 mg | INTRAMUSCULAR | Status: AC
  Administered 2016-11-14 (×2): 2 mg via INTRAVENOUS

## 2016-11-14 MED ORDER — NEOSTIGMINE METHYLSULFATE 10 MG/10ML IV SOLN
INTRAVENOUS | Status: DC | PRN
  Administered 2016-11-14 (×2): 2 mg via INTRAVENOUS

## 2016-11-14 MED ORDER — BUPIVACAINE-EPINEPHRINE (PF) 0.5% -1:200000 IJ SOLN
INTRAMUSCULAR | Status: DC | PRN
  Administered 2016-11-14: 60 mL via PERINEURAL

## 2016-11-14 MED ORDER — CELECOXIB 400 MG PO CAPS
ORAL_CAPSULE | ORAL | Status: AC
  Filled 2016-11-14: qty 1

## 2016-11-14 MED ORDER — MIDAZOLAM HCL 2 MG/2ML IJ SOLN
INTRAMUSCULAR | Status: AC
  Filled 2016-11-14: qty 2

## 2016-11-14 MED ORDER — PROPOFOL 10 MG/ML IV BOLUS
INTRAVENOUS | Status: DC | PRN
  Administered 2016-11-14: 180 mg via INTRAVENOUS

## 2016-11-14 MED ORDER — FENTANYL CITRATE (PF) 250 MCG/5ML IJ SOLN
INTRAMUSCULAR | Status: AC
  Filled 2016-11-14: qty 5

## 2016-11-14 MED ORDER — OXYCODONE HCL 5 MG PO TABS
ORAL_TABLET | ORAL | Status: AC
  Filled 2016-11-14: qty 1

## 2016-11-14 MED ORDER — FENTANYL CITRATE (PF) 100 MCG/2ML IJ SOLN
INTRAMUSCULAR | Status: DC | PRN
  Administered 2016-11-14 (×6): 50 ug via INTRAVENOUS

## 2016-11-14 MED ORDER — PREGABALIN 50 MG PO CAPS
50.0000 mg | ORAL_CAPSULE | Freq: Once | ORAL | Status: AC
  Administered 2016-11-14: 50 mg via ORAL

## 2016-11-14 MED ORDER — CELECOXIB 400 MG PO CAPS
400.0000 mg | ORAL_CAPSULE | Freq: Once | ORAL | Status: AC
  Administered 2016-11-14: 400 mg via ORAL

## 2016-11-14 MED ORDER — EPHEDRINE SULFATE 50 MG/ML IJ SOLN
INTRAMUSCULAR | Status: DC | PRN
  Administered 2016-11-14: 10 mg via INTRAVENOUS

## 2016-11-14 MED ORDER — CEFAZOLIN SODIUM-DEXTROSE 2-4 GM/100ML-% IV SOLN
2.0000 g | INTRAVENOUS | Status: AC
  Administered 2016-11-14: 2 g via INTRAVENOUS

## 2016-11-14 MED ORDER — CEFAZOLIN SODIUM-DEXTROSE 2-4 GM/100ML-% IV SOLN
INTRAVENOUS | Status: AC
  Filled 2016-11-14: qty 100

## 2016-11-14 MED ORDER — CHLORHEXIDINE GLUCONATE 4 % EX LIQD: 60.0000 mL | Freq: Once | CUTANEOUS | Status: DC

## 2016-11-14 MED ORDER — ONDANSETRON HCL 4 MG/2ML IJ SOLN
4.0000 mg | Freq: Once | INTRAMUSCULAR | Status: AC
  Administered 2016-11-14: 4 mg via INTRAVENOUS

## 2016-11-14 MED ORDER — HYDROMORPHONE HCL 1 MG/ML IJ SOLN: 0.2500 mg | INTRAMUSCULAR | Status: DC | PRN

## 2016-11-14 MED ORDER — SUCCINYLCHOLINE CHLORIDE 20 MG/ML IJ SOLN
INTRAMUSCULAR | Status: AC
  Filled 2016-11-14: qty 1

## 2016-11-14 MED ORDER — BUPIVACAINE-EPINEPHRINE (PF) 0.5% -1:200000 IJ SOLN
INTRAMUSCULAR | Status: AC
  Filled 2016-11-14: qty 60

## 2016-11-14 MED ORDER — OXYCODONE-ACETAMINOPHEN 5-325 MG PO TABS: 1.0000 | ORAL_TABLET | Freq: Four times a day (QID) | ORAL | 0 refills | Status: DC | PRN

## 2016-11-14 MED ORDER — LACTATED RINGERS IV SOLN
INTRAVENOUS | Status: DC
  Administered 2016-11-14 (×2): via INTRAVENOUS

## 2016-11-14 MED ORDER — PROPOFOL 10 MG/ML IV BOLUS
INTRAVENOUS | Status: AC
  Filled 2016-11-14: qty 40

## 2016-11-14 MED ORDER — OXYCODONE HCL 5 MG PO TABS
5.0000 mg | ORAL_TABLET | Freq: Once | ORAL | Status: AC
  Administered 2016-11-14: 5 mg via ORAL

## 2016-11-14 MED ORDER — PHENYLEPHRINE HCL 10 MG/ML IJ SOLN
INTRAMUSCULAR | Status: DC | PRN
  Administered 2016-11-14: 40 ug via INTRAVENOUS

## 2016-11-14 SURGICAL SUPPLY — 64 items
BIT DRILL 2.0MX128MM (BIT) IMPLANT
BLADE HEX COATED 2.75 (ELECTRODE) ×3 IMPLANT
BLADE OSC/SAGITTAL MD 9X18.5 (BLADE) ×3 IMPLANT
BUR FAST CUTTING (BURR)
BUR ROUND 5.0 (BURR) IMPLANT
BUR SRG 54X4.7X12 FLUT (BURR) IMPLANT
BURR SRG 54X4.7X12 FLUT (BURR)
CHLORAPREP W/TINT 26ML (MISCELLANEOUS) ×5 IMPLANT
CLOTH BEACON ORANGE TIMEOUT ST (SAFETY) ×3 IMPLANT
COVER LIGHT HANDLE STERIS (MISCELLANEOUS) ×6 IMPLANT
DRAPE PROXIMA HALF (DRAPES) ×3 IMPLANT
DRESSING ALLEVYN BORDER 5X5 (GAUZE/BANDAGES/DRESSINGS) ×3 IMPLANT
ELECT REM PT RETURN 9FT ADLT (ELECTROSURGICAL) ×3
ELECTRODE REM PT RTRN 9FT ADLT (ELECTROSURGICAL) ×1 IMPLANT
GLOVE BIOGEL M 7.0 STRL (GLOVE) ×2 IMPLANT
GLOVE BIOGEL PI IND STRL 6.5 (GLOVE) IMPLANT
GLOVE BIOGEL PI IND STRL 7.0 (GLOVE) ×1 IMPLANT
GLOVE BIOGEL PI INDICATOR 6.5 (GLOVE) ×2
GLOVE BIOGEL PI INDICATOR 7.0 (GLOVE) ×2
GLOVE ECLIPSE 6.5 STRL STRAW (GLOVE) ×2 IMPLANT
GLOVE EXAM NITRILE XS STR PU (GLOVE) ×2 IMPLANT
GLOVE SKINSENSE NS SZ8.0 LF (GLOVE) ×2
GLOVE SKINSENSE STRL SZ8.0 LF (GLOVE) ×1 IMPLANT
GLOVE SS N UNI LF 8.5 STRL (GLOVE) ×3 IMPLANT
GLOVE SURG SS PI 6.5 STRL IVOR (GLOVE) ×2 IMPLANT
GOWN STRL REUS W/TWL LRG LVL3 (GOWN DISPOSABLE) ×6 IMPLANT
GOWN STRL REUS W/TWL XL LVL3 (GOWN DISPOSABLE) ×3 IMPLANT
IMP SYSTEM BRIDGE 4.75X19.1 (Anchor) ×3 IMPLANT
IMPL SYSTEM BRIDGE 4.75X19.1 (Anchor) IMPLANT
INST SET MINOR BONE (KITS) ×3 IMPLANT
KIT BLADEGUARD II DBL (SET/KITS/TRAYS/PACK) ×3 IMPLANT
KIT ROOM TURNOVER APOR (KITS) ×3 IMPLANT
MANIFOLD NEPTUNE II (INSTRUMENTS) ×3 IMPLANT
MARKER SKIN DUAL TIP RULER LAB (MISCELLANEOUS) ×3 IMPLANT
NDL HYPO 21X1.5 SAFETY (NEEDLE) ×1 IMPLANT
NDL MA TROC 1/2 (NEEDLE) IMPLANT
NDL MAYO 6 CRC TAPER PT (NEEDLE) IMPLANT
NDL SCORPION MULTI FIRE (NEEDLE) IMPLANT
NEEDLE HYPO 21X1.5 SAFETY (NEEDLE) ×3 IMPLANT
NEEDLE MA TROC 1/2 (NEEDLE) IMPLANT
NEEDLE MAYO 6 CRC TAPER PT (NEEDLE) ×3 IMPLANT
NEEDLE SCORPION MULTI FIRE (NEEDLE) ×3 IMPLANT
NS IRRIG 1000ML POUR BTL (IV SOLUTION) ×5 IMPLANT
PACK TOTAL JOINT (CUSTOM PROCEDURE TRAY) ×3 IMPLANT
PAD ARMBOARD 7.5X6 YLW CONV (MISCELLANEOUS) ×3 IMPLANT
PASSER SUT CAPTURE FIRST (SUTURE) IMPLANT
PASSER SUT SWANSON 36MM LOOP (INSTRUMENTS) ×3 IMPLANT
RASP SM TEAR CROSS CUT (RASP) ×2 IMPLANT
SET BASIN LINEN APH (SET/KITS/TRAYS/PACK) ×3 IMPLANT
SLING ARM IMMOBILIZER LRG (SOFTGOODS) IMPLANT
SLING ARM IMMOBILIZER MED (SOFTGOODS) IMPLANT
SLING ARM IMMOBILIZER XL (CAST SUPPLIES) ×2 IMPLANT
STAPLER VISISTAT 35W (STAPLE) ×2 IMPLANT
SUT BONE WAX W31G (SUTURE) IMPLANT
SUT ETHIBOND NAB OS 4 #2 30IN (SUTURE) ×3 IMPLANT
SUT ETHILON 3 0 FSL (SUTURE) IMPLANT
SUT FIBERWIRE #2 38 T-5 BLUE (SUTURE) ×6
SUT MON AB 0 CT1 (SUTURE) ×3 IMPLANT
SUT MON AB 2-0 CT1 36 (SUTURE) ×3 IMPLANT
SUT PROLENE 3 0 PS 1 (SUTURE) IMPLANT
SUTURE FIBERWR #2 38 T-5 BLUE (SUTURE) IMPLANT
SYR 30ML LL (SYRINGE) ×3 IMPLANT
SYR BULB IRRIGATION 50ML (SYRINGE) ×3 IMPLANT
YANKAUER SUCT 12FT TUBE ARGYLE (SUCTIONS) ×3 IMPLANT

## 2016-11-14 NOTE — Brief Op Note (Signed)
11/14/2016  12:35 PM  PATIENT:  Francisco Fox  50 y.o. male  PRE-OPERATIVE DIAGNOSIS:  RIGHT ROTATOR CUFF TEAR  POST-OPERATIVE DIAGNOSIS:  RIGHT ROTATOR CUFF TEAR  PROCEDURE:  Procedure(s): RIGHT OPEN ROTATOR CUFF REPAIR (Right)   Findings this was a large tear the supraspinatus and portion of the infraspinatus tendon it was retracted 3-1/2 cm and had changes of chronic tendon degeneration  Swivel lock anchors 4 double row equivalent repair  Arthrex instruments  The patient was identified in the preop area site was confirmed and marked as right shoulder chart was updated and reviewed patient was taken to surgery given Ancef given general anesthesia placed in the beachchair position  The incision was made over the acromioclavicular joint and carried down to the anterolateral portion of the acromion subpatellar tissue was divided. Skin flap created with blunt dissection. The raphae between the anterior middle deltoid was split up to the acromion and subperiosteal dissection was performed to create medial and lateral deltoid flaps  Acromioplasty was performed. We had a bare humeral head with expression of fluid once we opened up the deltoid and cleared out the bursa. A full bursectomy was performed by internally and externally rotating the arm  The rotator cuff was mobilized with blunt dissection superiorly and inferiorly from the posterior position and then anteriorly and at the joint margins.  This brought the cuff over to the articular margin but not over to the greater tuberosity  We decompressed the greater tuberosity with a greater tuberosity plasty and then performed the acromioplasty with a saw and bur  We placed 2 sutures in the rotator cuff to mobilize it and hold it and then placed to medialize suture anchors past 4 sutures crisscrossed them and then passed 2 more suture anchors were double row equivalent repair  We did this with the arm at the side and without tension on  the rotator cuff  4 note the biceps tendon was intact and not diseased  The acromioclavicular joint did not have any inferior spurring and was left intact  Thorough irrigation was performed the deltoid fascia was closed with interrupted FiberWire suture. Subcutaneous tissue was closed with 0 Monocryl and staples we injected 30 mL of Marcaine sub-fascial and suprafascial he above the deltoid and below  A shoulder immobilizer was placed the patient was extubated and taken to recovery room in stable condition  [id number]  SURGEON:  Surgeon(s) and Role:    * Reyaansh Merlo E Arius Harnois, MD - Primary  PHYSICIAN ASSISTANT:   ASSISTANTS: Betty Ashley   ANESTHESIA:   general  EBL:  Total I/O In: 700 [I.V.:700] Out: 25 [Blood:25]  BLOOD ADMINISTERED:none  DRAINS: none   LOCAL MEDICATIONS USED:  MARCAINE     SPECIMEN:  No Specimen  DISPOSITION OF SPECIMEN:  N/A  COUNTS:  YES  TOURNIQUET:  * No tourniquets in log *  DICTATION: .Dragon Dictation  PLAN OF CARE: Discharge to home after PACU  PATIENT DISPOSITION:  PACU - hemodynamically stable.   Delay start of Pharmacological VTE agent (>24hrs) due to surgical blood loss or risk of bleeding: n/a  

## 2016-11-14 NOTE — Op Note (Signed)
11/14/2016  12:35 PM  PATIENT:  Francisco Fox  51 y.o. male  PRE-OPERATIVE DIAGNOSIS:  RIGHT ROTATOR CUFF TEAR  POST-OPERATIVE DIAGNOSIS:  RIGHT ROTATOR CUFF TEAR  PROCEDURE:  Procedure(s): RIGHT OPEN ROTATOR CUFF REPAIR (Right)   Findings this was a large tear the supraspinatus and portion of the infraspinatus tendon it was retracted 3-1/2 cm and had changes of chronic tendon degeneration  Swivel lock anchors 4 double row equivalent repair  Arthrex instruments  The patient was identified in the preop area site was confirmed and marked as right shoulder chart was updated and reviewed patient was taken to surgery given Ancef given general anesthesia placed in the beachchair position  The incision was made over the acromioclavicular joint and carried down to the anterolateral portion of the acromion subpatellar tissue was divided. Skin flap created with blunt dissection. The raphae between the anterior middle deltoid was split up to the acromion and subperiosteal dissection was performed to create medial and lateral deltoid flaps  Acromioplasty was performed. We had a bare humeral head with expression of fluid once we opened up the deltoid and cleared out the bursa. A full bursectomy was performed by internally and externally rotating the arm  The rotator cuff was mobilized with blunt dissection superiorly and inferiorly from the posterior position and then anteriorly and at the joint margins.  This brought the cuff over to the articular margin but not over to the greater tuberosity  We decompressed the greater tuberosity with a greater tuberosity plasty and then performed the acromioplasty with a saw and bur  We placed 2 sutures in the rotator cuff to mobilize it and hold it and then placed to medialize suture anchors past 4 sutures crisscrossed them and then passed 2 more suture anchors were double row equivalent repair  We did this with the arm at the side and without tension on  the rotator cuff  4 note the biceps tendon was intact and not diseased  The acromioclavicular joint did not have any inferior spurring and was left intact  Thorough irrigation was performed the deltoid fascia was closed with interrupted FiberWire suture. Subcutaneous tissue was closed with 0 Monocryl and staples we injected 30 mL of Marcaine sub-fascial and suprafascial he above the deltoid and below  A shoulder immobilizer was placed the patient was extubated and taken to recovery room in stable condition  [id number]  SURGEON:  Surgeon(s) and Role:    * Vickki HearingStanley E Makyra Corprew, MD - Primary  PHYSICIAN ASSISTANT:   ASSISTANTS: [place] NationBetty Ashley   ANESTHESIA:   general  EBL:  Total I/O In: 700 [I.V.:700] Out: 25 [Blood:25]  BLOOD ADMINISTERED:none  DRAINS: none   LOCAL MEDICATIONS USED:  MARCAINE     SPECIMEN:  No Specimen  DISPOSITION OF SPECIMEN:  N/A  COUNTS:  YES  TOURNIQUET:  * No tourniquets in log *  DICTATION: .Dragon Dictation  PLAN OF CARE: Discharge to home after PACU  PATIENT DISPOSITION:  PACU - hemodynamically stable.   Delay start of Pharmacological VTE agent (>24hrs) due to surgical blood loss or risk of bleeding: n/a

## 2016-11-14 NOTE — Interval H&P Note (Signed)
History and Physical Interval Note:  11/14/2016 10:00 AM There were no vitals taken for this visit.  Skin check normal   Francisco Fox  has presented today for surgery, with the diagnosis of RIGHT ROTATOR CUFF TEAR  The various methods of treatment have been discussed with the patient and family. After consideration of risks, benefits and other options for treatment, the patient has consented to  Procedure(s) with comments: ROTATOR CUFF REPAIR SHOULDER OPEN W/ GRAFT (Right) - pt knows to arrive at 9:30 as a surgical intervention .  The patient's history has been reviewed, patient examined, no change in status, stable for surgery.  I have reviewed the patient's chart and labs.  Questions were answered to the patient's satisfaction.     Fuller CanadaStanley Amal Fox

## 2016-11-14 NOTE — Anesthesia Preprocedure Evaluation (Signed)
Anesthesia Evaluation  Patient identified by MRN, date of birth, ID band Patient awake    Reviewed: Allergy & Precautions, NPO status , Patient's Chart, lab work & pertinent test results  Airway Mallampati: II  TM Distance: >3 FB     Dental  (+) Teeth Intact   Pulmonary neg pulmonary ROS,    breath sounds clear to auscultation       Cardiovascular negative cardio ROS   Rhythm:Regular Rate:Normal     Neuro/Psych    GI/Hepatic Neg liver ROS, GERD  Medicated,  Endo/Other  negative endocrine ROS  Renal/GU negative Renal ROS     Musculoskeletal   Abdominal   Peds  Hematology   Anesthesia Other Findings   Reproductive/Obstetrics                             Anesthesia Physical Anesthesia Plan  ASA: II  Anesthesia Plan: General   Post-op Pain Management:    Induction: Intravenous, Rapid sequence and Cricoid pressure planned  Airway Management Planned: Oral ETT  Additional Equipment:   Intra-op Plan:   Post-operative Plan: Extubation in OR  Informed Consent: I have reviewed the patients History and Physical, chart, labs and discussed the procedure including the risks, benefits and alternatives for the proposed anesthesia with the patient or authorized representative who has indicated his/her understanding and acceptance.     Plan Discussed with:   Anesthesia Plan Comments:         Anesthesia Quick Evaluation

## 2016-11-14 NOTE — Anesthesia Procedure Notes (Signed)
Procedure Name: Intubation Date/Time: 11/14/2016 10:49 AM Performed by: Tressie Stalker E Pre-anesthesia Checklist: Patient identified, Patient being monitored, Timeout performed, Emergency Drugs available and Suction available Patient Re-evaluated:Patient Re-evaluated prior to inductionOxygen Delivery Method: Circle system utilized Preoxygenation: Pre-oxygenation with 100% oxygen Intubation Type: IV induction, Combination inhalational/ intravenous induction and Rapid sequence Ventilation: Mask ventilation without difficulty Laryngoscope Size: Mac and 3 Grade View: Grade III Tube type: Oral Tube size: 7.0 mm Number of attempts: 1 Airway Equipment and Method: Stylet Placement Confirmation: ETT inserted through vocal cords under direct vision,  positive ETCO2 and breath sounds checked- equal and bilateral Secured at: 21 cm Tube secured with: Tape Dental Injury: Teeth and Oropharynx as per pre-operative assessment  Comments: Would consider Miller or glidescope, 1 attempt but less than ideal view.

## 2016-11-14 NOTE — Transfer of Care (Signed)
Immediate Anesthesia Transfer of Care Note  Patient: Stacey DrainLarry L Brien  Procedure(s) Performed: Procedure(s): RIGHT OPEN ROTATOR CUFF REPAIR (Right)  Patient Location: PACU  Anesthesia Type:General  Level of Consciousness: awake and alert   Airway & Oxygen Therapy: Patient Spontanous Breathing and Patient connected to nasal cannula oxygen  Post-op Assessment: Report given to RN  Post vital signs: Reviewed and stable  Last Vitals:  Vitals:   11/14/16 1035 11/14/16 1246  BP: (!) 156/98   Pulse: 100 89  Resp: 20 20  Temp:  36.8 C    Last Pain:  Vitals:   11/14/16 1001  TempSrc: Oral  PainSc: 5       Patients Stated Pain Goal: 7 (11/14/16 1001)  Complications: No apparent anesthesia complications

## 2016-11-14 NOTE — Anesthesia Postprocedure Evaluation (Signed)
Anesthesia Post Note  Patient: Francisco Fox  Procedure(s) Performed: Procedure(s) (LRB): RIGHT OPEN ROTATOR CUFF REPAIR (Right)  Patient location during evaluation: PACU Anesthesia Type: General Level of consciousness: awake and alert and oriented Pain management: pain level controlled Vital Signs Assessment: post-procedure vital signs reviewed and stable Respiratory status: spontaneous breathing Cardiovascular status: blood pressure returned to baseline Postop Assessment: no signs of nausea or vomiting Anesthetic complications: no     Last Vitals:  Vitals:   11/14/16 1412 11/14/16 1421  BP: (!) 147/91 (!) 142/90  Pulse:  90  Resp: 20 16  Temp:  36.6 C    Last Pain:  Vitals:   11/14/16 1421  TempSrc: Oral  PainSc:                  Alarik Radu

## 2016-11-18 ENCOUNTER — Encounter (HOSPITAL_COMMUNITY): Payer: Self-pay | Admitting: Orthopedic Surgery

## 2016-11-21 ENCOUNTER — Encounter: Payer: Self-pay | Admitting: Orthopedic Surgery

## 2016-11-22 ENCOUNTER — Ambulatory Visit (INDEPENDENT_AMBULATORY_CARE_PROVIDER_SITE_OTHER): Payer: BC Managed Care – PPO | Admitting: Orthopedic Surgery

## 2016-11-22 DIAGNOSIS — Z9889 Other specified postprocedural states: Secondary | ICD-10-CM

## 2016-11-22 DIAGNOSIS — Z4889 Encounter for other specified surgical aftercare: Secondary | ICD-10-CM

## 2016-11-22 MED ORDER — CYCLOBENZAPRINE HCL 10 MG PO TABS: 10.0000 mg | ORAL_TABLET | Freq: Three times a day (TID) | ORAL | 1 refills | Status: DC | PRN

## 2016-11-22 NOTE — Patient Instructions (Signed)
Wear sling shot shoulder brace full time  Keep dressing clean and dry  Wash in the axilla as instructed

## 2016-11-22 NOTE — Progress Notes (Signed)
Postop visit status post rotator cuff repair  Chief Complaint  Patient presents with  . Follow-up    Post op #1, Right shoulder, RCR, DOS 11-14-16.   Doing well taking pain medication and naproxen and Flexeril which needs a refill  Wound looks clean dry and intact  Convert to a sling shot  No therapy until postop week #4  Meds ordered this encounter  Medications  . DISCONTD: cyclobenzaprine (FLEXERIL) 10 MG tablet    Sig: Take 1 tablet (10 mg total) by mouth 3 (three) times daily as needed.    Dispense:  60 tablet    Refill:  1  . cyclobenzaprine (FLEXERIL) 10 MG tablet    Sig: Take 1 tablet (10 mg total) by mouth 3 (three) times daily as needed.    Dispense:  60 tablet    Refill:  1    11/14/2016  12:35 PM  PATIENT:  Francisco Fox  51 y.o. male  PRE-OPERATIVE DIAGNOSIS:  RIGHT ROTATOR CUFF TEAR  POST-OPERATIVE DIAGNOSIS:  RIGHT ROTATOR CUFF TEAR  PROCEDURE:  Procedure(s): RIGHT OPEN ROTATOR CUFF REPAIR (Right)   Findings this was a large tear the supraspinatus and portion of the infraspinatus tendon it was retracted 3-1/2 cm and had changes of chronic tendon degeneration  Swivel lock anchors 4 double row equivalent repair  Arthrex instruments

## 2016-11-27 ENCOUNTER — Other Ambulatory Visit (HOSPITAL_COMMUNITY): Payer: Self-pay | Admitting: Orthopedic Surgery

## 2016-11-29 ENCOUNTER — Ambulatory Visit (INDEPENDENT_AMBULATORY_CARE_PROVIDER_SITE_OTHER): Payer: BC Managed Care – PPO | Admitting: Orthopedic Surgery

## 2016-11-29 ENCOUNTER — Encounter: Payer: Self-pay | Admitting: Orthopedic Surgery

## 2016-11-29 DIAGNOSIS — Z9889 Other specified postprocedural states: Secondary | ICD-10-CM

## 2016-11-29 DIAGNOSIS — Z4889 Encounter for other specified surgical aftercare: Secondary | ICD-10-CM

## 2016-11-29 MED ORDER — OXYCODONE-ACETAMINOPHEN 5-325 MG PO TABS: 1.0000 | ORAL_TABLET | Freq: Four times a day (QID) | ORAL | 0 refills | Status: DC | PRN

## 2016-11-29 NOTE — Patient Instructions (Signed)
Wear sling as instructed  Start therapy on March 12  Follow-up to see me on March 26

## 2016-11-29 NOTE — Progress Notes (Signed)
Postoperative follow-up visit status post rotator cuff repair right shoulder on February 8  11/14/2016  12:35 PM  PATIENT:  Francisco Fox  51 y.o. male  PRE-OPERATIVE DIAGNOSIS:  RIGHT ROTATOR CUFF TEAR  POST-OPERATIVE DIAGNOSIS:  RIGHT ROTATOR CUFF TEAR  PROCEDURE:  Procedure(s): RIGHT OPEN ROTATOR CUFF REPAIR (Right)   Findings this was a large tear the supraspinatus and portion of the infraspinatus tendon it was retracted 3-1/2 cm and had changes of chronic tendon degeneration  Swivel lock anchors 4 double row equivalent repair  Arthrex instruments    He comes in today to have his sutures taken out and we will wait approximately 2 weeks before starting therapy so that will take us to about March 12 we can start physical therapy  Clean incision no swelling good hand function  Continue sling for now  Passive range of motion for 6 weeks Followed by active range of motion for 6 weeks and then progressive resistance exercises for 3 months

## 2016-11-29 NOTE — Addendum Note (Signed)
Addended by: Adella HareBOOTHE, JAIME B on: 11/29/2016 11:33 AM   Modules accepted: Orders

## 2016-12-05 ENCOUNTER — Encounter: Payer: Self-pay | Admitting: Orthopedic Surgery

## 2016-12-05 ENCOUNTER — Other Ambulatory Visit: Payer: Self-pay | Admitting: *Deleted

## 2016-12-05 DIAGNOSIS — Z9889 Other specified postprocedural states: Secondary | ICD-10-CM

## 2016-12-05 DIAGNOSIS — Z4889 Encounter for other specified surgical aftercare: Secondary | ICD-10-CM

## 2016-12-05 MED ORDER — OXYCODONE-ACETAMINOPHEN 5-325 MG PO TABS: 1.0000 | ORAL_TABLET | Freq: Four times a day (QID) | ORAL | 0 refills | Status: DC | PRN

## 2016-12-06 ENCOUNTER — Other Ambulatory Visit: Payer: Self-pay | Admitting: Orthopedic Surgery

## 2016-12-06 MED ORDER — OXYCODONE-ACETAMINOPHEN 5-325 MG PO TABS: 1.0000 | ORAL_TABLET | Freq: Three times a day (TID) | ORAL | 0 refills | Status: DC | PRN

## 2016-12-06 NOTE — Progress Notes (Signed)
[place] controlled substance reporting system reviewed  

## 2016-12-16 ENCOUNTER — Ambulatory Visit (HOSPITAL_COMMUNITY): Payer: BC Managed Care – PPO | Attending: Orthopedic Surgery | Admitting: Specialist

## 2016-12-16 DIAGNOSIS — M25511 Pain in right shoulder: Secondary | ICD-10-CM | POA: Insufficient documentation

## 2016-12-16 DIAGNOSIS — R29898 Other symptoms and signs involving the musculoskeletal system: Secondary | ICD-10-CM | POA: Diagnosis present

## 2016-12-16 DIAGNOSIS — M25611 Stiffness of right shoulder, not elsewhere classified: Secondary | ICD-10-CM | POA: Diagnosis present

## 2016-12-16 NOTE — Therapy (Signed)
[facility] [address] [place], [place], [address] Phone: [phone]   Fax:  [phone]  Occupational Therapy Evaluation  Patient Details  Name: Francisco Fox MRN: [id number] Date of Birth: 05-20-1966 Referring Provider: Dr. Fuller Canada  Encounter Date: 12/16/2016      OT End of Session - 12/16/16 1051    Visit Number 1   Number of Visits 16   Date for OT Re-Evaluation 02/14/17   Authorization Type BCBS State   OT Start Time 0915   OT Stop Time 0945   OT Time Calculation (min) 30 min   Activity Tolerance Patient tolerated treatment well   Behavior During Therapy [facility] for tasks assessed/performed      Past Medical History:  Diagnosis Date  . Arthritis   . GERD (gastroesophageal reflux disease)     Past Surgical History:  Procedure Laterality Date  . CHOLECYSTECTOMY    . CYST EXCISION Right   . SHOULDER OPEN ROTATOR CUFF REPAIR Right 11/14/2016   Procedure: RIGHT OPEN ROTATOR CUFF REPAIR;  Surgeon: Vickki Hearing, MD;  Location: [facility];  Service: Orthopedics;  Laterality: Right;    There were no vitals filed for this visit.      Subjective Assessment - 12/16/16 1045    Subjective  S:  A few months ago, I was playing football and I hurt my right shoulder.     Patient is accompained by: Family member   Pertinent History Francisco Fox reports playing football with his grandkids several months ago and injuring his right shoulder.  He consulted with Dr. Hilda Lias and was given a cortisone injection, which did not alleviate his pain.  A MRI detected a supraspinatus and infraspinatus tear.  Dr. Romeo Apple performed rotator cuff tear repair surgery on 11/14/16.  He has been referred to occupational therapy for evaluation and treatment following his rotator cuff repair protocol:  Passive range of motion for 6 weeks, followed by A/ROM for 6 weeks, followed by PRE's.     Special Tests FOTO:  62/100   Patient Stated Goals I want to  be able to use my arm like normal again   Currently in Pain? No/denies           [facility] OT Assessment - 12/16/16 0001      Assessment   Diagnosis S/P RIght Rotator Cuff Repair   Referring Provider Dr. Fuller Canada   Onset Date 11/14/16     Precautions   Precautions Shoulder   Precaution Comments P/ROM 12/16/16-01/20/17, A/ROM 01/20/17- 03/03/17, PRE 03/03/17 on     Restrictions   Weight Bearing Restrictions No     Balance Screen   Has the patient fallen in the past 6 months No   Has the patient had a decrease in activity level because of a fear of falling?  No   Is the patient reluctant to leave their home because of a fear of falling?  No     Home  Environment   Family/patient expects to be discharged to: Private residence   Living Arrangements Spouse/significant other   Lives With Spouse     Prior Function   Level of Independence Independent  driving    Vocation Full time employment   Vocation Requirements works full time for [employer]   Leisure enjoys washing cars, working outside      ADL   ADL comments unable to use right arm above waist height with funcitonal tasks, unable to work  Written Expression   Dominant Hand Right     Vision - History   Baseline Vision No visual deficits     Cognition   Overall Cognitive Status Within Functional Limits for tasks assessed     Observation/Other Assessments   Focus on Therapeutic Outcomes (FOTO)  FOTO  62/100     Sensation   Light Touch Appears Intact     Coordination   Gross Motor Movements are Fluid and Coordinated Yes   Fine Motor Movements are Fluid and Coordinated Yes     ROM / Strength   AROM / PROM / Strength AROM;PROM;Strength     Palpation   Palpation comment min-mod fascial restrictions noted in right shoulder region      AROM   Overall AROM Comments A/ROM not assessed due to recent surgery   AROM Assessment Site Shoulder   Right/Left Shoulder Right     PROM   Overall PROM Comments  assessed in supine, External rotation and internal rotation with shoulder adducted   PROM Assessment Site Shoulder   Right/Left Shoulder Right   Right Shoulder Flexion 105 Degrees   Right Shoulder ABduction 102 Degrees   Right Shoulder Internal Rotation 80 Degrees   Right Shoulder External Rotation 70 Degrees     Strength   Overall Strength Comments strength not assessed due to recent surgery   Strength Assessment Site Shoulder   Right/Left Shoulder Right                  OT Treatments/Exercises (OP) - 12/16/16 0001      Manual Therapy   Manual Therapy Myofascial release   Manual therapy comments manual therapy completed seperately from all other interventions this date.     Myofascial Release myofascial release, manual stretching to right upper arm, scapular, shoulder, trapezius region to decrease pain and restrictions and improve pain free mobilty .                 OT Education - 12/16/16 1050    Education provided Yes   Education Details Educated patient on importance of wearing sling when he is out of his home, educated patient on towel slides for right shoulder flexion, abduction, external/internal rotation   Person(s) Educated Patient;Spouse   Methods Explanation;Demonstration;Handout   Comprehension Verbalized understanding          OT Short Term Goals - 12/16/16 1056      OT SHORT TERM GOAL #1   Title Patient will be educated on a HEP for improved mobility and strength needed to return to prior level of function.    Time 4   Period Weeks   Status New     OT SHORT TERM GOAL #2   Title Patient will imrpove right shoulder P/ROM to WNL for improved ability to complete upper extremity dressing.   Time 4   Period Weeks   Status New     OT SHORT TERM GOAL #3   Title Patient will improve right shoulder strength to 3+/5 for improved ability to reach to shoulder height.    Time 4   Period Weeks   Status New     OT SHORT TERM GOAL #4   Title  Patient will decrease fascial restrictions from min-mod to min for imrpoved mobility needed to completeion functional activities.    Time 4   Period Weeks   Status New           OT Long Term Goals - 12/16/16 1058  OT LONG TERM GOAL #1   Title Patient will return to prior level of independence with all B/IADLs, work, leisure activities using right arm as dominant.    Time 8   Period Weeks   Status New     OT LONG TERM GOAL #2   Title Patient will improve right shoulder A/ROM to WNL in order to reach overhead into cabinets.    Time 8   Period Weeks   Status New     OT LONG TERM GOAL #3   Title Patient will improve right shoulder strength to 5/5 for improved ability to lift materials at work.    Time 8   Period Weeks   Status New     OT LONG TERM GOAL #4   Title Patient will improve right shoulder mobility by decreasing right shoulder fascial restrictions to trace.    Time 8   Period Weeks   Status New               Plan - 12/16/16 1051    Clinical Impression Statement A:  Patient is a 51 year old male with past medical history significant for right rotator cuff repair surgery on 11/14/16; patient had a full thickness supraspinatus tear and a partial thickness infraspinatus tear.  Patient has been referred to occupational therapy for evaluation and treatment following Dr. Mort Sawyers rotator cuff repair protocol of P/ROM for 6 weeks, A/ROM for 6 weeks, followed by PRE.  Patient is married and works full time for [employer].  Patient is not able to use his dominant right hand with any funtional activities and is not able to work at this time.    Rehab Potential Good   OT Frequency 2x / week   OT Duration 8 weeks   OT Treatment/Interventions Self-care/ADL training;Cryotherapy;Electrical Stimulation;Moist Heat;Ultrasound;Iontophoresis;Therapeutic exercise;Neuromuscular education;Energy conservation;DME and/or AE instruction;Passive range of motion;Manual  Therapy;Therapeutic exercises;Therapeutic activities;Patient/family education   Plan P:  Skilled OT intervention 2 times per week for 12 weeks, following Dr. Mort Sawyers protocol in order to return to full A/ROM, strength in order to return to PLOF with B/IADLs, work, leisure activities.  Next session:  review POC, manual therapy, P/ROM, A/ROM of elevation, row, extension.    OT Home Exercise Plan 12/16/16:  table slides   Consulted and Agree with Plan of Care Family member/caregiver;Patient   Family Member Consulted spouse      Patient will benefit from skilled therapeutic intervention in order to improve the following deficits and impairments:  Decreased range of motion, Decreased strength, Increased fascial restricitons, Impaired UE functional use, Increased muscle spasms, Pain  Visit Diagnosis: Acute pain of right shoulder - Plan: Ot plan of care cert/re-cert  Stiffness of right shoulder, not elsewhere classified - Plan: Ot plan of care cert/re-cert  Other symptoms and signs involving the musculoskeletal system - Plan: Ot plan of care cert/re-cert    Problem List There are no active problems to display for this patient.   Shirlean Mylar, MHA, OTR/L [phone]  12/16/2016, 11:10 AM  [facility] [address] [place], [place], [address] Phone: [phone]   Fax:  [phone]  Name: Francisco Fox MRN: [id number] Date of Birth: [birth year]

## 2016-12-16 NOTE — Patient Instructions (Signed)
  Complete each exercise 1-3 minutes 2-3 times per day.    SHOULDER: Flexion On Table   Place hands on towel on table, elbows straight. Slide arms across table, holding the stretch, then returning to upright position. Complete 1-3 minutes, 2-3 times per day. Abduction (Passive)   With arm out to side, resting on towel on table with palm down, slide arm across table, bending at waist.  Copyright  VHI. All rights reserved.     Internal Rotation (Assistive)   Seated with elbow bent at right angle and held against side, slide arm on table surface in an inward arc. Repeat ____ times. Do ____ sessions per day. Activity: Use this motion to brush crumbs off the table.  Copyright  VHI. All rights reserved.

## 2016-12-18 ENCOUNTER — Other Ambulatory Visit: Payer: Self-pay | Admitting: Orthopedic Surgery

## 2016-12-18 MED ORDER — HYDROCODONE-ACETAMINOPHEN 7.5-325 MG PO TABS: 1.0000 | ORAL_TABLET | Freq: Four times a day (QID) | ORAL | 0 refills | Status: DC | PRN

## 2016-12-18 NOTE — Progress Notes (Signed)
[place] controlled substance reporting system reviewed  

## 2016-12-19 ENCOUNTER — Ambulatory Visit (HOSPITAL_COMMUNITY): Payer: BC Managed Care – PPO | Admitting: Specialist

## 2016-12-19 DIAGNOSIS — M25511 Pain in right shoulder: Secondary | ICD-10-CM

## 2016-12-19 DIAGNOSIS — R29898 Other symptoms and signs involving the musculoskeletal system: Secondary | ICD-10-CM

## 2016-12-19 DIAGNOSIS — M25611 Stiffness of right shoulder, not elsewhere classified: Secondary | ICD-10-CM

## 2016-12-19 NOTE — Therapy (Signed)
[facility] [address] [place], [place], [address] Phone: [phone]   Fax:  [phone]  Occupational Therapy Treatment  Patient Details  Name: Francisco Fox MRN: [id number] Date of Birth: [birth year] Referring Provider: Dr. Fuller CanadaStanley Harrison  Encounter Date: 12/19/2016      OT End of Session - 12/19/16 1422    Visit Number 2   Number of Visits 16   Date for OT Re-Evaluation 02/14/17  01/16/17   Authorization Type BCBS State   OT Start Time 1351   OT Stop Time 1420   OT Time Calculation (min) 29 min   Activity Tolerance Patient tolerated treatment well   Behavior During Therapy [facility] for tasks assessed/performed      Past Medical History:  Diagnosis Date  . Arthritis   . GERD (gastroesophageal reflux disease)     Past Surgical History:  Procedure Laterality Date  . CHOLECYSTECTOMY    . CYST EXCISION Right   . SHOULDER OPEN ROTATOR CUFF REPAIR Right 11/14/2016   Procedure: RIGHT OPEN ROTATOR CUFF REPAIR;  Surgeon: Vickki HearingStanley E Harrison, MD;  Location: [facility];  Service: Orthopedics;  Laterality: Right;    There were no vitals filed for this visit.      Subjective Assessment - 12/19/16 1422    Subjective  S:  I did the exercises, they are ok.     Currently in Pain? No/denies            [facility] Assessment - 12/19/16 0001      Assessment   Diagnosis S/P RIght Rotator Cuff Repair   Referring Provider Dr. Fuller CanadaStanley Harrison   Onset Date 11/14/16     Precautions   Precautions Shoulder   Precaution Comments P/ROM 12/16/16-01/20/17, A/ROM 01/20/17- 03/03/17, PRE 03/03/17 on                  OT Treatments/Exercises (OP) - 12/19/16 0001      Exercises   Exercises Shoulder     Shoulder Exercises: Supine   Protraction PROM;10 reps   Horizontal ABduction PROM;10 reps   External Rotation PROM;10 reps   Internal Rotation PROM;10 reps   Flexion PROM;10 reps   ABduction PROM;10 reps   Other Supine Exercises  serratus anterior punch 10 times with therapist unweighting arm     Shoulder Exercises: Seated   Elevation AROM;10 reps   Extension AROM;10 reps   Row AROM;10 reps     Shoulder Exercises: Therapy Ball   Flexion 15 reps   ABduction 15 reps     Shoulder Exercises: ROM/Strengthening   Anterior Glide 3 X 3"   Caudal Glide 3 X 3"     Shoulder Exercises: Isometric Strengthening   Flexion Supine;3X3"   Extension Supine;3X3"   External Rotation Supine;3X3"   Internal Rotation Supine;3X3"   ABduction Supine;3X3"   ADduction Supine;3X3"     Manual Therapy   Manual Therapy Myofascial release   Manual therapy comments manual therapy completed seperately from all other interventions this date.     Myofascial Release myofascial release, manual stretching to right upper arm, scapular, shoulder, trapezius region to decrease pain and restrictions and improve pain free mobilty .                  OT Education - 12/19/16 1422    Education provided Yes   Education Details reviewed plan of care and issued a written copy   Person(s) Educated Patient;Child(ren)   Methods Explanation;Handout   Comprehension Verbalized  understanding          OT Short Term Goals - 12/19/16 1425      OT SHORT TERM GOAL #1   Title Patient will be educated on a HEP for improved mobility and strength needed to return to prior level of function.    Time 4   Period Weeks   Status On-going     OT SHORT TERM GOAL #2   Title Patient will imrpove right shoulder P/ROM to WNL for improved ability to complete upper extremity dressing.   Time 4   Period Weeks   Status On-going     OT SHORT TERM GOAL #3   Title Patient will improve right shoulder strength to 3+/5 for improved ability to reach to shoulder height.    Time 4   Period Weeks   Status On-going     OT SHORT TERM GOAL #4   Title Patient will decrease fascial restrictions from min-mod to min for imrpoved mobility needed to completeion functional  activities.    Time 4   Period Weeks   Status On-going           OT Long Term Goals - 12/19/16 1425      OT LONG TERM GOAL #1   Title Patient will return to prior level of independence with all B/IADLs, work, leisure activities using right arm as dominant.    Time 8   Period Weeks   Status On-going     OT LONG TERM GOAL #2   Title Patient will improve right shoulder A/ROM to WNL in order to reach overhead into cabinets.    Time 8   Period Weeks   Status On-going     OT LONG TERM GOAL #3   Title Patient will improve right shoulder strength to 5/5 for improved ability to lift materials at work.    Time 8   Period Weeks   Status On-going     OT LONG TERM GOAL #4   Title Patient will improve right shoulder mobility by decreasing right shoulder fascial restrictions to trace.    Time 8   Period Weeks   Status On-going               Plan - 12/19/16 1423    Clinical Impression Statement A:  Patient with functional P/ROM abduction, external rotation, internal rotation.  Patient's most limited into flexion, which is approximately 120.  Musculature tight throughout scapular and shoulder region.  Completed Scapular myofascial release and trigger poiint release in seated and supine.     Plan P:  Continue Dr. Mort Sawyers protocol.  Increase contraction time with isometrics, glides.  Add prone scapular mobilization and P/ROM into flexion in prone position.        Patient will benefit from skilled therapeutic intervention in order to improve the following deficits and impairments:  Decreased range of motion, Decreased strength, Increased fascial restricitons, Impaired UE functional use, Increased muscle spasms, Pain  Visit Diagnosis: Acute pain of right shoulder  Stiffness of right shoulder, not elsewhere classified  Other symptoms and signs involving the musculoskeletal system    Problem List There are no active problems to display for this patient.   Shirlean Mylar, MHA, OTR/L [phone]  12/19/2016, 2:31 PM  [facility] [address] [place], [place], [address] Phone: [phone]   Fax:  [phone]  Name: Francisco Fox MRN: [id number] Date of Birth: 01/24/1966

## 2016-12-23 ENCOUNTER — Encounter (HOSPITAL_COMMUNITY): Payer: Self-pay

## 2016-12-23 ENCOUNTER — Ambulatory Visit (HOSPITAL_COMMUNITY): Payer: BC Managed Care – PPO

## 2016-12-23 DIAGNOSIS — M25511 Pain in right shoulder: Secondary | ICD-10-CM

## 2016-12-23 DIAGNOSIS — M25611 Stiffness of right shoulder, not elsewhere classified: Secondary | ICD-10-CM

## 2016-12-23 DIAGNOSIS — R29898 Other symptoms and signs involving the musculoskeletal system: Secondary | ICD-10-CM

## 2016-12-23 NOTE — Therapy (Signed)
[place], [name], [address] Phone: [phone]   Fax:  [phone]  Occupational Therapy Treatment  Patient Details  Name: Francisco Fox MRN: [id number] Date of Birth: 1966/07/13 Referring Provider: Dr. Arther Abbott  Encounter Date: 12/23/2016      OT End of Session - 12/23/16 1023    Visit Number 3   Number of Visits 16   Date for OT Re-Evaluation 02/14/17  01/16/17   Authorization Type BCBS State   OT Start Time [phone]   OT Stop Time 1030   OT Time Calculation (min) 37 min   Activity Tolerance Patient tolerated treatment well   Behavior During Therapy [facility] for tasks assessed/performed      Past Medical History:  Diagnosis Date  . Arthritis   . GERD (gastroesophageal reflux disease)     Past Surgical History:  Procedure Laterality Date  . CHOLECYSTECTOMY    . CYST EXCISION Right   . SHOULDER OPEN ROTATOR CUFF REPAIR Right 11/14/2016   Procedure: RIGHT OPEN ROTATOR CUFF REPAIR;  Surgeon: Carole Civil, MD;  Location: [facility];  Service: Orthopedics;  Laterality: Right;    There were no vitals filed for this visit.      Subjective Assessment - 12/23/16 1017    Subjective  S: I think I may have slept on my arm some last night.    Currently in Pain? Yes   Pain Score 4    Pain Location Shoulder   Pain Orientation Right   Pain Descriptors / Indicators Aching   Pain Type Acute pain   Pain Radiating Towards N/A   Pain Onset Yesterday   Pain Frequency Occasional   Aggravating Factors  Sleeping on it   Pain Relieving Factors not sleeping on it.   Effect of Pain on Daily Activities patient is unable to complete any daily task with his RUE.   Multiple Pain Sites No            [facility] OT Assessment - 12/23/16 1006      Assessment   Diagnosis S/P RIght Rotator Cuff Repair     Precautions   Precautions Shoulder   Precaution Comments P/ROM 12/16/16-01/20/17, A/ROM 01/20/17- 03/03/17, PRE  03/03/17 on                  OT Treatments/Exercises (OP) - 12/23/16 1006      Exercises   Exercises Shoulder     Shoulder Exercises: Supine   Protraction PROM;10 reps   Horizontal ABduction PROM;10 reps   External Rotation PROM;10 reps   Internal Rotation PROM;10 reps   Flexion PROM;10 reps   ABduction PROM;10 reps   Other Supine Exercises serratus anterior punch 10 times with therapist unweighting arm     Shoulder Exercises: Seated   Elevation AROM;10 reps   Extension AROM;10 reps   Row AROM;10 reps     Shoulder Exercises: Prone   Flexion PROM;10 reps   Other Prone Exercises Scapular retraction with elbow locked; 10X with physical cueing     Shoulder Exercises: Therapy Ball   Flexion 15 reps   ABduction 15 reps     Shoulder Exercises: ROM/Strengthening   Anterior Glide 3x10"   Caudal Glide 3x10"     Shoulder Exercises: Isometric Strengthening   Flexion Supine;5X5"   Extension Supine;5X5"   External Rotation Supine;5X5"   Internal Rotation Supine;5X5"   ABduction Supine;5X5"   ADduction Supine;5X5"     Manual Therapy   Manual Therapy  Myofascial release   Manual therapy comments manual therapy completed seperately from all other interventions this date.     Myofascial Release myofascial release, manual stretching to right upper arm, scapular, shoulder, trapezius region to decrease pain and restrictions and improve pain free mobilty .                    OT Short Term Goals - 12/19/16 1425      OT SHORT TERM GOAL #1   Title Patient will be educated on a HEP for improved mobility and strength needed to return to prior level of function.    Time 4   Period Weeks   Status On-going     OT SHORT TERM GOAL #2   Title Patient will imrpove right shoulder P/ROM to WNL for improved ability to complete upper extremity dressing.   Time 4   Period Weeks   Status On-going     OT SHORT TERM GOAL #3   Title Patient will improve right shoulder strength  to 3+/5 for improved ability to reach to shoulder height.    Time 4   Period Weeks   Status On-going     OT SHORT TERM GOAL #4   Title Patient will decrease fascial restrictions from min-mod to min for imrpoved mobility needed to completeion functional activities.    Time 4   Period Weeks   Status On-going           OT Long Term Goals - 12/19/16 1425      OT LONG TERM GOAL #1   Title Patient will return to prior level of independence with all B/IADLs, work, leisure activities using right arm as dominant.    Time 8   Period Weeks   Status On-going     OT LONG TERM GOAL #2   Title Patient will improve right shoulder A/ROM to WNL in order to reach overhead into cabinets.    Time 8   Period Weeks   Status On-going     OT LONG TERM GOAL #3   Title Patient will improve right shoulder strength to 5/5 for improved ability to lift materials at work.    Time 8   Period Weeks   Status On-going     OT LONG TERM GOAL #4   Title Patient will improve right shoulder mobility by decreasing right shoulder fascial restrictions to trace.    Time 8   Period Weeks   Status On-going               Plan - 12/23/16 1026    Clinical Impression Statement A: Increased isometric hold this session, added prone scapular mobilizations and P/ROM flexion. patient required cues to relax muscle and needed min cues for form and technique.   Plan P: Add thumb tacks and pro/ret/elev/dep.      Patient will benefit from skilled therapeutic intervention in order to improve the following deficits and impairments:  Decreased range of motion, Decreased strength, Increased fascial restricitons, Impaired UE functional use, Increased muscle spasms, Pain  Visit Diagnosis: Acute pain of right shoulder  Stiffness of right shoulder, not elsewhere classified  Other symptoms and signs involving the musculoskeletal system    Problem List There are no active problems to display for this patient.  Ailene Ravel, OTR/L,CBIS  [phone]  12/23/2016, 10:31 AM  [place] [address] [place], [name], [address] Phone: [phone]   Fax:  [phone]  Name: [place] Yearby  Fredericks MRN: [id number] Date of Birth: 1966-05-05

## 2016-12-25 ENCOUNTER — Ambulatory Visit (HOSPITAL_COMMUNITY): Payer: BC Managed Care – PPO | Admitting: Specialist

## 2016-12-25 DIAGNOSIS — M25511 Pain in right shoulder: Secondary | ICD-10-CM

## 2016-12-25 DIAGNOSIS — R29898 Other symptoms and signs involving the musculoskeletal system: Secondary | ICD-10-CM

## 2016-12-25 DIAGNOSIS — M25611 Stiffness of right shoulder, not elsewhere classified: Secondary | ICD-10-CM

## 2016-12-25 NOTE — Therapy (Signed)
[place], [name], [address] Phone: [phone]   Fax:  [phone]  Occupational Therapy Treatment  Patient Details  Name: Francisco Fox MRN: [id number] Date of Birth: Aug 09, 1966 Referring Provider: Dr. Arther Abbott  Encounter Date: 12/25/2016      OT End of Session - 12/25/16 1041    Visit Number 4   Number of Visits 16   Date for OT Re-Evaluation 02/14/17  mini reassess on 01/16/17   Authorization Type BCBS State   OT Start Time [phone]   OT Stop Time 1030   OT Time Calculation (min) 39 min   Activity Tolerance Patient tolerated treatment well   Behavior During Therapy [facility] for tasks assessed/performed      Past Medical History:  Diagnosis Date  . Arthritis   . GERD (gastroesophageal reflux disease)     Past Surgical History:  Procedure Laterality Date  . CHOLECYSTECTOMY    . CYST EXCISION Right   . SHOULDER OPEN ROTATOR CUFF REPAIR Right 11/14/2016   Procedure: RIGHT OPEN ROTATOR CUFF REPAIR;  Surgeon: Carole Civil, MD;  Location: [facility];  Service: Orthopedics;  Laterality: Right;    There were no vitals filed for this visit.      Subjective Assessment - 12/25/16 1040    Subjective  S:  I am doing my exercises 3 times per day.   Currently in Pain? Yes   Pain Score 2    Pain Location Shoulder   Pain Orientation Right   Pain Descriptors / Indicators Aching   Pain Type Acute pain   Pain Onset 1 to 4 weeks ago   Pain Frequency Occasional   Aggravating Factors  movement   Pain Relieving Factors rest   Effect of Pain on Daily Activities decreased use of right arm with funcitonal activities            [facility] OT Assessment - 12/25/16 0001      Assessment   Diagnosis S/P RIght Rotator Cuff Repair     Precautions   Precautions Shoulder   Precaution Comments P/ROM 12/16/16-01/20/17, A/ROM 01/20/17- 03/03/17, PRE 03/03/17 on                  OT Treatments/Exercises (OP) -  12/25/16 0001      Exercises   Exercises Shoulder     Shoulder Exercises: Supine   Protraction PROM;10 reps   Horizontal ABduction PROM;10 reps   External Rotation PROM;10 reps   Internal Rotation PROM;10 reps   Flexion PROM;10 reps   ABduction PROM;10 reps     Shoulder Exercises: Seated   Elevation AROM;15 reps   Extension AROM;15 reps   Row AROM;15 reps     Shoulder Exercises: Prone   Flexion --  resume next visit   Other Prone Exercises resume next visit     Shoulder Exercises: Therapy Ball   Flexion 20 reps   ABduction 20 reps     Shoulder Exercises: ROM/Strengthening   Thumb Tacks 1 min low   Anterior Glide 5 X 10"   Caudal Glide 5 X 10"   Prot/Ret//Elev/Dep 1 min low     Manual Therapy   Manual Therapy Myofascial release   Manual therapy comments manual therapy completed seperately from all other interventions this date.     Myofascial Release myofascial release, manual stretching to right upper arm, scapular, shoulder, trapezius region to decrease pain and restrictions and improve pain free mobilty .  OT Short Term Goals - 12/19/16 1425      OT SHORT TERM GOAL #1   Title Patient will be educated on a HEP for improved mobility and strength needed to return to prior level of function.    Time 4   Period Weeks   Status On-going     OT SHORT TERM GOAL #2   Title Patient will imrpove right shoulder P/ROM to WNL for improved ability to complete upper extremity dressing.   Time 4   Period Weeks   Status On-going     OT SHORT TERM GOAL #3   Title Patient will improve right shoulder strength to 3+/5 for improved ability to reach to shoulder height.    Time 4   Period Weeks   Status On-going     OT SHORT TERM GOAL #4   Title Patient will decrease fascial restrictions from min-mod to min for imrpoved mobility needed to completeion functional activities.    Time 4   Period Weeks   Status On-going           OT Long Term  Goals - 12/19/16 1425      OT LONG TERM GOAL #1   Title Patient will return to prior level of independence with all B/IADLs, work, leisure activities using right arm as dominant.    Time 8   Period Weeks   Status On-going     OT LONG TERM GOAL #2   Title Patient will improve right shoulder A/ROM to WNL in order to reach overhead into cabinets.    Time 8   Period Weeks   Status On-going     OT LONG TERM GOAL #3   Title Patient will improve right shoulder strength to 5/5 for improved ability to lift materials at work.    Time 8   Period Weeks   Status On-going     OT LONG TERM GOAL #4   Title Patient will improve right shoulder mobility by decreasing right shoulder fascial restrictions to trace.    Time 8   Period Weeks   Status On-going               Plan - 12/25/16 1041    Clinical Impression Statement A:  added thumbtacks and prot/ret/elev/dep this date with patient demonstrating good form and range in scapula.  Verbal guidance to deepen stretch with ball stretch activity.    Plan P:  Increase P/ROM in shoulder flexion and abduction to WNL.  add serratus anterior punch.      Patient will benefit from skilled therapeutic intervention in order to improve the following deficits and impairments:  Decreased range of motion, Decreased strength, Increased fascial restricitons, Impaired UE functional use, Increased muscle spasms, Pain  Visit Diagnosis: Acute pain of right shoulder  Stiffness of right shoulder, not elsewhere classified  Other symptoms and signs involving the musculoskeletal system    Problem List There are no active problems to display for this patient.  [name], [place], OTR/L [phone]  12/25/2016, 10:46 AM  [place] [address] [place], [name], [address] Phone: [phone]   Fax:  [phone]  Name: Francisco Fox MRN: [id number] Date of Birth: Sep 15, 1966

## 2016-12-27 ENCOUNTER — Encounter: Payer: Self-pay | Admitting: Orthopedic Surgery

## 2016-12-27 ENCOUNTER — Ambulatory Visit (INDEPENDENT_AMBULATORY_CARE_PROVIDER_SITE_OTHER): Payer: BC Managed Care – PPO | Admitting: Orthopedic Surgery

## 2016-12-27 DIAGNOSIS — Z9889 Other specified postprocedural states: Secondary | ICD-10-CM

## 2016-12-27 DIAGNOSIS — Z4889 Encounter for other specified surgical aftercare: Secondary | ICD-10-CM

## 2016-12-27 NOTE — Progress Notes (Signed)
This is a follow-up visit. The patient had a rotator cuff repair Chief Complaint  Patient presents with  . Follow-up    Right rotator cuff repair, DOS 11/14/16    He's doing well his pain is controlled his therapy is going well  I was able to externally rotate his arm fully abducted to 90 and flex to 90 without pain  Recommend he follow-up in 4 weeks and he should continue his physical therapy.  We can start opioid weaning with his follow-up prescriptions Encounter Diagnoses  Name Primary?  Marland Kitchen. Aftercare following surgery Yes  . History of rotator cuff surgery

## 2016-12-30 ENCOUNTER — Encounter (HOSPITAL_COMMUNITY): Payer: Self-pay

## 2016-12-30 ENCOUNTER — Ambulatory Visit (HOSPITAL_COMMUNITY): Payer: BC Managed Care – PPO

## 2016-12-30 DIAGNOSIS — M25511 Pain in right shoulder: Secondary | ICD-10-CM | POA: Diagnosis not present

## 2016-12-30 DIAGNOSIS — M25611 Stiffness of right shoulder, not elsewhere classified: Secondary | ICD-10-CM

## 2016-12-30 DIAGNOSIS — R29898 Other symptoms and signs involving the musculoskeletal system: Secondary | ICD-10-CM

## 2016-12-30 NOTE — Therapy (Signed)
[place], [name], [address] Phone: [phone]   Fax:  [phone]  Occupational Therapy Treatment  Patient Details  Name: Francisco Fox MRN: [id number] Date of Birth: 1966-03-11 Referring Provider: Dr. Arther Abbott  Encounter Date: 12/30/2016      OT End of Session - 12/30/16 1637    Visit Number 5   Number of Visits 16   Date for OT Re-Evaluation 02/14/17  mini reassess on 01/16/17   Authorization Type BCBS State   OT Start Time 1035   OT Stop Time 1115   OT Time Calculation (min) 40 min   Activity Tolerance Patient tolerated treatment well   Behavior During Therapy [facility] for tasks assessed/performed      Past Medical History:  Diagnosis Date  . Arthritis   . GERD (gastroesophageal reflux disease)     Past Surgical History:  Procedure Laterality Date  . CHOLECYSTECTOMY    . CYST EXCISION Right   . SHOULDER OPEN ROTATOR CUFF REPAIR Right 11/14/2016   Procedure: RIGHT OPEN ROTATOR CUFF REPAIR;  Surgeon: Carole Civil, MD;  Location: [facility];  Service: Orthopedics;  Laterality: Right;    There were no vitals filed for this visit.      Subjective Assessment - 12/30/16 1635    Subjective  S: I'm just a little sore.    Currently in Pain? Yes   Pain Score 4    Pain Location Shoulder   Pain Orientation Right   Pain Descriptors / Indicators Aching   Pain Type Acute pain            [facility] OT Assessment - 12/30/16 1636      Assessment   Diagnosis S/P RIght Rotator Cuff Repair     Precautions   Precautions Shoulder   Precaution Comments P/ROM 12/16/16-01/20/17, A/ROM 01/20/17- 03/03/17, PRE 03/03/17 on                  OT Treatments/Exercises (OP) - 12/30/16 1054      Exercises   Exercises Shoulder     Shoulder Exercises: Supine   Protraction PROM;10 reps   Horizontal ABduction PROM;10 reps   External Rotation PROM;10 reps   Internal Rotation PROM;10 reps   Flexion  PROM;10 reps   ABduction PROM;10 reps     Shoulder Exercises: Seated   Elevation AROM;10 reps   Extension AROM;15 reps   Row AROM;15 reps     Shoulder Exercises: Therapy Ball   Flexion 20 reps   ABduction 20 reps     Shoulder Exercises: ROM/Strengthening   Thumb Tacks 1'   Anterior Glide 5 X 15"   Caudal Glide 10 X 10"   Prot/Ret//Elev/Dep 1'     Shoulder Exercises: Isometric Strengthening   Flexion Supine  3x10"   Extension Supine  3x10"   External Rotation Supine  3x10"   Internal Rotation Supine  3x10"   ABduction Supine  3x10"   ADduction Supine  3x10'     Manual Therapy   Manual Therapy Myofascial release   Manual therapy comments manual therapy completed seperately from all other interventions this date.     Myofascial Release myofascial release, manual stretching to right upper arm, scapular, shoulder, trapezius region to decrease pain and restrictions and improve pain free mobilty .                    OT Short Term Goals - 12/19/16 1425  OT SHORT TERM GOAL #1   Title Patient will be educated on a HEP for improved mobility and strength needed to return to prior level of function.    Time 4   Period Weeks   Status On-going     OT SHORT TERM GOAL #2   Title Patient will imrpove right shoulder P/ROM to WNL for improved ability to complete upper extremity dressing.   Time 4   Period Weeks   Status On-going     OT SHORT TERM GOAL #3   Title Patient will improve right shoulder strength to 3+/5 for improved ability to reach to shoulder height.    Time 4   Period Weeks   Status On-going     OT SHORT TERM GOAL #4   Title Patient will decrease fascial restrictions from min-mod to min for imrpoved mobility needed to completeion functional activities.    Time 4   Period Weeks   Status On-going           OT Long Term Goals - 12/19/16 1425      OT LONG TERM GOAL #1   Title Patient will return to prior level of independence with all  B/IADLs, work, leisure activities using right arm as dominant.    Time 8   Period Weeks   Status On-going     OT LONG TERM GOAL #2   Title Patient will improve right shoulder A/ROM to WNL in order to reach overhead into cabinets.    Time 8   Period Weeks   Status On-going     OT LONG TERM GOAL #3   Title Patient will improve right shoulder strength to 5/5 for improved ability to lift materials at work.    Time 8   Period Weeks   Status On-going     OT LONG TERM GOAL #4   Title Patient will improve right shoulder mobility by decreasing right shoulder fascial restrictions to trace.    Time 8   Period Weeks   Status On-going               Plan - 12/30/16 1637    Clinical Impression Statement A: Added serratus anterior punch this session with patient completing with good form and technique. Continues to be limited with right shoulder flexion, abduction, and external rotation.    Plan P: Continue with prone exercises. Continue to work on increasing P/ROM of shoulder during flexion, abduction, and external rotation.       Patient will benefit from skilled therapeutic intervention in order to improve the following deficits and impairments:  Decreased range of motion, Decreased strength, Increased fascial restricitons, Impaired UE functional use, Increased muscle spasms, Pain  Visit Diagnosis: Acute pain of right shoulder  Stiffness of right shoulder, not elsewhere classified  Other symptoms and signs involving the musculoskeletal system    Problem List There are no active problems to display for this patient.  Ailene Ravel, OTR/L,CBIS  [phone]  12/30/2016, 4:39 PM  [facility] [address] [place], [name], [address] Phone: [phone]   Fax:  [phone]  Name: Francisco Fox MRN: [id number] Date of Birth: [birth year]

## 2017-01-01 ENCOUNTER — Encounter (HOSPITAL_COMMUNITY): Payer: Self-pay

## 2017-01-01 ENCOUNTER — Ambulatory Visit (HOSPITAL_COMMUNITY): Payer: BC Managed Care – PPO

## 2017-01-01 DIAGNOSIS — M25511 Pain in right shoulder: Secondary | ICD-10-CM | POA: Diagnosis not present

## 2017-01-01 DIAGNOSIS — R29898 Other symptoms and signs involving the musculoskeletal system: Secondary | ICD-10-CM

## 2017-01-01 DIAGNOSIS — M25611 Stiffness of right shoulder, not elsewhere classified: Secondary | ICD-10-CM

## 2017-01-01 NOTE — Therapy (Signed)
[place], [name], [address] Phone: [phone]   Fax:  [phone]  Occupational Therapy Treatment  Patient Details  Name: Francisco Fox MRN: [id number] Date of Birth: March 24, 1966 Referring Provider: Dr. Arther Abbott  Encounter Date: 01/01/2017      OT End of Session - 01/01/17 1103    Visit Number 6   Number of Visits 16   Date for OT Re-Evaluation 02/14/17  mini reassess on 01/16/17   Authorization Type BCBS State   OT Start Time [phone]   OT Stop Time 1030   OT Time Calculation (min) 40 min   Activity Tolerance Patient tolerated treatment well   Behavior During Therapy [facility] for tasks assessed/performed      Past Medical History:  Diagnosis Date  . Arthritis   . GERD (gastroesophageal reflux disease)     Past Surgical History:  Procedure Laterality Date  . CHOLECYSTECTOMY    . CYST EXCISION Right   . SHOULDER OPEN ROTATOR CUFF REPAIR Right 11/14/2016   Procedure: RIGHT OPEN ROTATOR CUFF REPAIR;  Surgeon: Carole Civil, MD;  Location: [facility];  Service: Orthopedics;  Laterality: Right;    There were no vitals filed for this visit.      Subjective Assessment - 01/01/17 1013    Subjective  S: I did my exercises before I came here.    Currently in Pain? Yes   Pain Score 3    Pain Location Shoulder   Pain Orientation Right   Pain Descriptors / Indicators Aching   Pain Type Acute pain            [facility] OT Assessment - 01/01/17 1005      Assessment   Diagnosis S/P RIght Rotator Cuff Repair     Precautions   Precautions Shoulder   Precaution Comments P/ROM 12/16/16-01/20/17, A/ROM 01/20/17- 03/03/17, PRE 03/03/17 on                  OT Treatments/Exercises (OP) - 01/01/17 1005      Exercises   Exercises Shoulder     Shoulder Exercises: Supine   Protraction PROM;10 reps   Horizontal ABduction PROM;10 reps   External Rotation PROM;10 reps   Internal Rotation PROM;10 reps    Flexion PROM;10 reps   ABduction PROM;10 reps     Shoulder Exercises: Prone   Flexion AROM;12 reps   Extension AROM;12 reps     Shoulder Exercises: Therapy Ball   Flexion 20 reps   ABduction 20 reps     Shoulder Exercises: ROM/Strengthening   Thumb Tacks 1'   Anterior Glide 5 X 15"   Caudal Glide 5X15"   Prot/Ret//Elev/Dep 1'     Shoulder Exercises: Isometric Strengthening   Flexion Supine  3x10   Extension Supine  3x10   External Rotation Supine  310   Internal Rotation Supine  310   ABduction Supine  3x10   ADduction Supine  3x10     Manual Therapy   Manual Therapy Myofascial release   Manual therapy comments manual therapy completed seperately from all other interventions this date.                    OT Short Term Goals - 12/19/16 1425      OT SHORT TERM GOAL #1   Title Patient will be educated on a HEP for improved mobility and strength needed to return to prior level of function.    Time 4  Period Weeks   Status On-going     OT SHORT TERM GOAL #2   Title Patient will imrpove right shoulder P/ROM to WNL for improved ability to complete upper extremity dressing.   Time 4   Period Weeks   Status On-going     OT SHORT TERM GOAL #3   Title Patient will improve right shoulder strength to 3+/5 for improved ability to reach to shoulder height.    Time 4   Period Weeks   Status On-going     OT SHORT TERM GOAL #4   Title Patient will decrease fascial restrictions from min-mod to min for imrpoved mobility needed to completeion functional activities.    Time 4   Period Weeks   Status On-going           OT Long Term Goals - 12/19/16 1425      OT LONG TERM GOAL #1   Title Patient will return to prior level of independence with all B/IADLs, work, leisure activities using right arm as dominant.    Time 8   Period Weeks   Status On-going     OT LONG TERM GOAL #2   Title Patient will improve right shoulder A/ROM to WNL in order to reach  overhead into cabinets.    Time 8   Period Weeks   Status On-going     OT LONG TERM GOAL #3   Title Patient will improve right shoulder strength to 5/5 for improved ability to lift materials at work.    Time 8   Period Weeks   Status On-going     OT LONG TERM GOAL #4   Title Patient will improve right shoulder mobility by decreasing right shoulder fascial restrictions to trace.    Time 8   Period Weeks   Status On-going               Plan - 01/01/17 1103    Clinical Impression Statement A: Continued with prone exercises. Slightly increase with passive abduction although continues to be limited with shoulder ROM.    Plan P: Continue to work on increasing P/ROM of of shoulder during flexion, abduction, and external rotation.      Patient will benefit from skilled therapeutic intervention in order to improve the following deficits and impairments:  Decreased range of motion, Decreased strength, Increased fascial restricitons, Impaired UE functional use, Increased muscle spasms, Pain  Visit Diagnosis: Acute pain of right shoulder  Stiffness of right shoulder, not elsewhere classified  Other symptoms and signs involving the musculoskeletal system    Problem List There are no active problems to display for this patient.  Ailene Ravel, OTR/L,CBIS  [phone]  01/01/2017, 11:18 AM  [place], [name], [address] Phone: [phone]   Fax:  [phone]  Name: Francisco Fox MRN: [id number] Date of Birth: 27-Mar-1966

## 2017-01-06 ENCOUNTER — Ambulatory Visit (HOSPITAL_COMMUNITY): Payer: BC Managed Care – PPO | Attending: Orthopedic Surgery | Admitting: Specialist

## 2017-01-06 DIAGNOSIS — M25511 Pain in right shoulder: Secondary | ICD-10-CM | POA: Insufficient documentation

## 2017-01-06 DIAGNOSIS — M25611 Stiffness of right shoulder, not elsewhere classified: Secondary | ICD-10-CM | POA: Insufficient documentation

## 2017-01-06 DIAGNOSIS — R29898 Other symptoms and signs involving the musculoskeletal system: Secondary | ICD-10-CM | POA: Insufficient documentation

## 2017-01-06 NOTE — Therapy (Signed)
[place], [name], [address] Phone: [phone]   Fax:  [phone]  Occupational Therapy Treatment  Patient Details  Name: Francisco Fox MRN: [id number] Date of Birth: [birth year] Referring Provider: Dr. Arther Abbott  Encounter Date: 01/06/2017      OT End of Session - 01/06/17 1043    Visit Number 7   Number of Visits 16   Date for OT Re-Evaluation 02/14/17  mini reassess on 4/12   Authorization Type BCBS State   OT Start Time [phone]   OT Stop Time 1027   OT Time Calculation (min) 39 min   Activity Tolerance Patient tolerated treatment well      Past Medical History:  Diagnosis Date  . Arthritis   . GERD (gastroesophageal reflux disease)     Past Surgical History:  Procedure Laterality Date  . CHOLECYSTECTOMY    . CYST EXCISION Right   . SHOULDER OPEN ROTATOR CUFF REPAIR Right 11/14/2016   Procedure: RIGHT OPEN ROTATOR CUFF REPAIR;  Surgeon: Carole Civil, MD;  Location: [facility];  Service: Orthopedics;  Laterality: Right;    There were no vitals filed for this visit.      Subjective Assessment - 01/06/17 1042    Subjective  S:  I have soreness mainly in the front of my shoulder   Currently in Pain? Yes   Pain Score 2    Pain Location Shoulder   Pain Orientation Right   Pain Descriptors / Indicators Aching   Pain Type Acute pain   Pain Onset 1 to 4 weeks ago   Pain Frequency Occasional   Aggravating Factors  use   Pain Relieving Factors rest   Effect of Pain on Daily Activities decreased use of dominant hand with daily tasks             [facility] OT Assessment - 01/06/17 0001      Assessment   Diagnosis S/P RIght Rotator Cuff Repair     Precautions   Precautions Shoulder   Precaution Comments P/ROM 12/16/16-01/20/17, A/ROM 01/20/17- 03/03/17, PRE 03/03/17 on                  OT Treatments/Exercises (OP) - 01/06/17 0001      Exercises   Exercises Shoulder      Shoulder Exercises: Supine   Protraction PROM;10 reps   Horizontal ABduction PROM;10 reps   External Rotation PROM;10 reps   Internal Rotation PROM;10 reps   Flexion PROM;10 reps   ABduction PROM;10 reps   Other Supine Exercises serratus anterior punch 15 times with therapist unweighting arm     Shoulder Exercises: Seated   Elevation AROM;15 reps   Extension AROM;15 reps   Row AROM;15 reps     Shoulder Exercises: Prone   Flexion PROM;10 reps   Extension AROM;15 reps     Shoulder Exercises: Sidelying   Other Sidelying Exercises closed chain protraction/retraction 10 times     Shoulder Exercises: Therapy Ball   Flexion 20 reps   ABduction 20 reps     Shoulder Exercises: ROM/Strengthening   Thumb Tacks 1'   Prot/Ret//Elev/Dep 1'     Shoulder Exercises: Isometric Strengthening   Flexion Supine  3 X 15"   Extension Supine  3 X 15"   External Rotation Supine  3 X 15"   Internal Rotation Supine  3 X 15"   ABduction Supine  3 X 15"   ADduction Supine  3 X 15"  Manual Therapy   Manual Therapy Myofascial release   Manual therapy comments manual therapy completed seperately from all other interventions this date.     Myofascial Release myofascial release, manual stretching to right upper arm, scapular, shoulder, trapezius region to decrease pain and restrictions and improve pain free mobilty .                    OT Short Term Goals - 01/06/17 1045      OT SHORT TERM GOAL #1   Title Patient will be educated on a HEP for improved mobility and strength needed to return to prior level of function.    Time 4   Period Weeks   Status On-going     OT SHORT TERM GOAL #2   Title Patient will imrpove right shoulder P/ROM to WNL for improved ability to complete upper extremity dressing.   Time 4   Period Weeks   Status On-going     OT SHORT TERM GOAL #3   Title Patient will improve right shoulder strength to 3+/5 for improved ability to reach to shoulder height.     Time 4   Period Weeks   Status On-going     OT SHORT TERM GOAL #4   Title Patient will decrease fascial restrictions from min-mod to min for imrpoved mobility needed to completeion functional activities.    Time 4   Period Weeks   Status On-going           OT Long Term Goals - 01/06/17 1045      OT LONG TERM GOAL #1   Title Patient will return to prior level of independence with all B/IADLs, work, leisure activities using right arm as dominant.    Time 8   Period Weeks   Status On-going     OT LONG TERM GOAL #2   Title Patient will improve right shoulder A/ROM to WNL in order to reach overhead into cabinets.    Time 8   Period Weeks   Status On-going     OT LONG TERM GOAL #3   Title Patient will improve right shoulder strength to 5/5 for improved ability to lift materials at work.    Time 8   Period Weeks   Status On-going     OT LONG TERM GOAL #4   Title Patient will improve right shoulder mobility by decreasing right shoulder fascial restrictions to trace.    Time 8   Period Weeks   Status On-going               Plan - 01/06/17 1044    Clinical Impression Statement A:  improved p/rom flexion in prone.  increased contraction time to 15" with all isometric strengthening.    Plan P:  Improve P/ROM to WNL in prep for beginning AA/ROM in 2 weeks.  Add sidelying P/ROM flexion, abduction, external rotation.       Patient will benefit from skilled therapeutic intervention in order to improve the following deficits and impairments:  Decreased range of motion, Decreased strength, Increased fascial restricitons, Impaired UE functional use, Increased muscle spasms, Pain  Visit Diagnosis: Acute pain of right shoulder  Stiffness of right shoulder, not elsewhere classified  Other symptoms and signs involving the musculoskeletal system    Problem List There are no active problems to display for this patient.   [name], [place],  OTR/L [phone]  01/06/2017, 10:50 AM  [facility]  [place], [name], [address] Phone: [phone]   Fax:  [phone]  Name: RUHAN BORAK MRN: [id number] Date of Birth: [birth year]

## 2017-01-07 ENCOUNTER — Other Ambulatory Visit: Payer: Self-pay | Admitting: Orthopedic Surgery

## 2017-01-08 ENCOUNTER — Ambulatory Visit (HOSPITAL_COMMUNITY): Payer: BC Managed Care – PPO

## 2017-01-08 ENCOUNTER — Telehealth (HOSPITAL_COMMUNITY): Payer: Self-pay | Admitting: Family Medicine

## 2017-01-08 ENCOUNTER — Other Ambulatory Visit: Payer: Self-pay | Admitting: Orthopedic Surgery

## 2017-01-08 MED ORDER — HYDROCODONE-ACETAMINOPHEN 7.5-325 MG PO TABS: 1.0000 | ORAL_TABLET | Freq: Four times a day (QID) | ORAL | 0 refills | Status: DC | PRN

## 2017-01-08 NOTE — Telephone Encounter (Signed)
4/4 message left that he was headed to [place] for his dad's funeral

## 2017-01-08 NOTE — Progress Notes (Signed)
Francisco Fox controlled substance reporting system reviewed  

## 2017-01-10 ENCOUNTER — Other Ambulatory Visit: Payer: Self-pay | Admitting: Orthopedic Surgery

## 2017-01-10 MED ORDER — HYDROCODONE-ACETAMINOPHEN 7.5-325 MG PO TABS: 1.0000 | ORAL_TABLET | Freq: Four times a day (QID) | ORAL | 0 refills | Status: DC | PRN

## 2017-01-13 ENCOUNTER — Ambulatory Visit (HOSPITAL_COMMUNITY): Payer: BC Managed Care – PPO | Admitting: Specialist

## 2017-01-13 ENCOUNTER — Telehealth (HOSPITAL_COMMUNITY): Payer: Self-pay | Admitting: Family Medicine

## 2017-01-13 NOTE — Telephone Encounter (Signed)
01/13/17 pt said he has the flu and was taking his last part of medication today.  Hopefully would be here Wed.

## 2017-01-15 ENCOUNTER — Encounter (HOSPITAL_COMMUNITY): Payer: Self-pay | Admitting: Occupational Therapy

## 2017-01-15 ENCOUNTER — Ambulatory Visit (HOSPITAL_COMMUNITY): Payer: BC Managed Care – PPO | Admitting: Occupational Therapy

## 2017-01-15 DIAGNOSIS — M25611 Stiffness of right shoulder, not elsewhere classified: Secondary | ICD-10-CM

## 2017-01-15 DIAGNOSIS — M25511 Pain in right shoulder: Secondary | ICD-10-CM

## 2017-01-15 DIAGNOSIS — R29898 Other symptoms and signs involving the musculoskeletal system: Secondary | ICD-10-CM

## 2017-01-15 NOTE — Therapy (Signed)
[place], [name], [address] Phone: [phone]   Fax:  [phone]  Occupational Therapy Treatment  Patient Details  Name: Francisco Fox MRN: [id number] Date of Birth: 06/12/1966 Referring Provider: Dr. Arther Abbott  Encounter Date: 01/15/2017      OT End of Session - 01/15/17 1056    Visit Number 8   Number of Visits 16   Date for OT Re-Evaluation 02/14/17  mini reassess on 4/12   Authorization Type BCBS State   OT Start Time [phone]   OT Stop Time 1030   OT Time Calculation (min) 41 min   Activity Tolerance Patient tolerated treatment well      Past Medical History:  Diagnosis Date  . Arthritis   . GERD (gastroesophageal reflux disease)     Past Surgical History:  Procedure Laterality Date  . CHOLECYSTECTOMY    . CYST EXCISION Right   . SHOULDER OPEN ROTATOR CUFF REPAIR Right 11/14/2016   Procedure: RIGHT OPEN ROTATOR CUFF REPAIR;  Surgeon: Carole Civil, MD;  Location: [facility];  Service: Orthopedics;  Laterality: Right;    There were no vitals filed for this visit.      Subjective Assessment - 01/15/17 0949    Subjective  S: I did my exercises before I came this morning.    Currently in Pain? Yes   Pain Score 2    Pain Location Shoulder   Pain Orientation Right   Pain Descriptors / Indicators Aching   Pain Type Acute pain   Pain Radiating Towards N/A   Pain Onset 1 to 4 weeks ago   Pain Frequency Occasional   Aggravating Factors  use   Pain Relieving Factors rest   Effect of Pain on Daily Activities decreased use of RUE with daily tasks   Multiple Pain Sites No            [facility] OT Assessment - 01/15/17 0949      Assessment   Diagnosis S/P RIght Rotator Cuff Repair     Precautions   Precautions Shoulder   Precaution Comments P/ROM 12/16/16-01/20/17, A/ROM 01/20/17- 03/03/17, PRE 03/03/17 on                  OT Treatments/Exercises (OP) - 01/15/17 0954      Exercises   Exercises Shoulder     Shoulder Exercises: Supine   Protraction PROM;10 reps   Horizontal ABduction PROM;10 reps   External Rotation PROM;10 reps   Internal Rotation PROM;10 reps   Flexion PROM;10 reps   ABduction PROM;10 reps   Other Supine Exercises serratus anterior punch 15 times with therapist unweighting arm     Shoulder Exercises: Therapy Ball   Flexion 20 reps   ABduction 20 reps     Shoulder Exercises: ROM/Strengthening   Thumb Tacks 1'   Anterior Glide 3 X 30"   Prot/Ret//Elev/Dep 1'     Shoulder Exercises: Isometric Strengthening   Flexion Supine  3X20"   Extension Supine  3X20"   External Rotation Supine  3X20"   Internal Rotation Supine  3X20"   ABduction Supine  3X20"   ADduction Supine  3X20"     Manual Therapy   Manual Therapy Myofascial release   Manual therapy comments manual therapy completed seperately from all other interventions this date.     Myofascial Release myofascial release, manual stretching to right upper arm, scapular, shoulder, trapezius region to decrease pain and restrictions and improve pain free mobilty .  OT Short Term Goals - 01/06/17 1045      OT SHORT TERM GOAL #1   Title Patient will be educated on a HEP for improved mobility and strength needed to return to prior level of function.    Time 4   Period Weeks   Status On-going     OT SHORT TERM GOAL #2   Title Patient will imrpove right shoulder P/ROM to WNL for improved ability to complete upper extremity dressing.   Time 4   Period Weeks   Status On-going     OT SHORT TERM GOAL #3   Title Patient will improve right shoulder strength to 3+/5 for improved ability to reach to shoulder height.    Time 4   Period Weeks   Status On-going     OT SHORT TERM GOAL #4   Title Patient will decrease fascial restrictions from min-mod to min for imrpoved mobility needed to completeion functional activities.    Time 4   Period Weeks    Status On-going           OT Long Term Goals - 01/06/17 1045      OT LONG TERM GOAL #1   Title Patient will return to prior level of independence with all B/IADLs, work, leisure activities using right arm as dominant.    Time 8   Period Weeks   Status On-going     OT LONG TERM GOAL #2   Title Patient will improve right shoulder A/ROM to WNL in order to reach overhead into cabinets.    Time 8   Period Weeks   Status On-going     OT LONG TERM GOAL #3   Title Patient will improve right shoulder strength to 5/5 for improved ability to lift materials at work.    Time 8   Period Weeks   Status On-going     OT LONG TERM GOAL #4   Title Patient will improve right shoulder mobility by decreasing right shoulder fascial restrictions to trace.    Time 8   Period Weeks   Status On-going               Plan - 01/15/17 1150    Clinical Impression Statement A: Pt P/ROM is WFL, increased isometric contraction time, continued with thumb tacks and prot/ret/elev/dep. Pt reports no pain today, completed exercises this morning.    Plan P: Begin AA/ROM per protocol. Update HEP   OT Home Exercise Plan 12/16/16:  table slides   Consulted and Agree with Plan of Care Patient      Patient will benefit from skilled therapeutic intervention in order to improve the following deficits and impairments:  Decreased range of motion, Decreased strength, Increased fascial restricitons, Impaired UE functional use, Increased muscle spasms, Pain  Visit Diagnosis: Acute pain of right shoulder  Stiffness of right shoulder, not elsewhere classified  Other symptoms and signs involving the musculoskeletal system    Problem List There are no active problems to display for this patient.  [name], OTR/L  [phone] 01/15/2017, 11:55 AM  [place] [address] [place], [name], [address] Phone: [phone]   Fax:  [phone]  Name: Francisco Fox MRN: [id number] Date of Birth: 1966-05-06

## 2017-01-20 ENCOUNTER — Ambulatory Visit (HOSPITAL_COMMUNITY): Payer: BC Managed Care – PPO

## 2017-01-20 DIAGNOSIS — M25611 Stiffness of right shoulder, not elsewhere classified: Secondary | ICD-10-CM

## 2017-01-20 DIAGNOSIS — M25511 Pain in right shoulder: Secondary | ICD-10-CM

## 2017-01-20 DIAGNOSIS — R29898 Other symptoms and signs involving the musculoskeletal system: Secondary | ICD-10-CM

## 2017-01-20 NOTE — Therapy (Signed)
[facility] [address] [place], [place], [address] Phone: [phone]   Fax:  [phone]  Occupational Therapy Treatment  Patient Details  Name: Francisco Fox MRN: [id number] Date of Birth: 1966/03/08 Referring Provider: Dr. Fuller Canada  Encounter Date: 01/20/2017      OT End of Session - 01/20/17 1032    Visit Number 9   Number of Visits 16   Date for OT Re-Evaluation 02/14/17   Authorization Type BCBS State   OT Start Time [phone]   OT Stop Time 1030   OT Time Calculation (min) 40 min   Activity Tolerance Patient tolerated treatment well   Behavior During Therapy [facility] for tasks assessed/performed      Past Medical History:  Diagnosis Date  . Arthritis   . GERD (gastroesophageal reflux disease)     Past Surgical History:  Procedure Laterality Date  . CHOLECYSTECTOMY    . CYST EXCISION Right   . SHOULDER OPEN ROTATOR CUFF REPAIR Right 11/14/2016   Procedure: RIGHT OPEN ROTATOR CUFF REPAIR;  Surgeon: Vickki Hearing, MD;  Location: [facility];  Service: Orthopedics;  Laterality: Right;    There were no vitals filed for this visit.      Subjective Assessment - 01/20/17 1031    Subjective  S: I'm haven't tried lifting it and reaching yet. But I can tel it's getting better.    Currently in Pain? Yes   Pain Score 2    Pain Location Shoulder   Pain Orientation Right   Pain Descriptors / Indicators Sore   Pain Type Acute pain            [facility] OT Assessment - 01/20/17 0953      Assessment   Diagnosis S/P RIght Rotator Cuff Repair     Precautions   Precautions Shoulder   Precaution Comments P/ROM 12/16/16-01/20/17, A/ROM 01/20/17- 03/03/17, PRE 03/03/17 on     ROM / Strength   AROM / PROM / Strength AROM;PROM     AROM   Overall AROM Comments Assessed standing. IR/er adducted. A/ROM not assessed previously.   AROM Assessment Site Shoulder   Right/Left Shoulder Right   Right Shoulder Flexion 120 Degrees   Right Shoulder ABduction 112 Degrees   Right Shoulder Internal Rotation 90 Degrees   Right Shoulder External Rotation 72 Degrees     PROM   Overall PROM Comments assessed in supine, External rotation and internal rotation with shoulder adducted   PROM Assessment Site Shoulder   Right/Left Shoulder Right   Right Shoulder Flexion 140 Degrees  previous: 105   Right Shoulder ABduction 140 Degrees  previous: 102   Right Shoulder Internal Rotation 90 Degrees  previous: 80   Right Shoulder External Rotation 75 Degrees  previous: 70     Strength   Overall Strength Comments assessed standing. IR/er adducted. Strength not assessed previously.    Strength Assessment Site Shoulder   Right/Left Shoulder Right   Right Shoulder Flexion 3-/5   Right Shoulder ABduction 3-/5   Right Shoulder Internal Rotation 3/5   Right Shoulder External Rotation 3/5                  OT Treatments/Exercises (OP) - 01/20/17 0954      Exercises   Exercises Shoulder     Shoulder Exercises: Supine   Protraction PROM;5 reps;AROM;10 reps   Horizontal ABduction PROM;5 reps;AROM;10 reps   External Rotation PROM;5 reps;AROM;10 reps   Internal Rotation PROM;5 reps;AROM;10 reps  Flexion PROM;5 reps;AROM;10 reps   ABduction PROM;5 reps;AROM;10 reps     Shoulder Exercises: Standing   Protraction AROM;10 reps   Horizontal ABduction AROM;10 reps   External Rotation AROM;10 reps   Internal Rotation AROM;10 reps   Flexion AROM;10 reps   ABduction AROM;10 reps     Shoulder Exercises: Pulleys   Flexion 1 minute   ABduction 1 minute     Shoulder Exercises: ROM/Strengthening   Wall Wash 1'     Shoulder Exercises: Stretch   Corner Stretch 2 reps  15 seconds   Internal Rotation Stretch 2 reps  15 seconds   Wall Stretch - Flexion 2 reps  15 seconds     Manual Therapy   Manual Therapy Myofascial release   Manual therapy comments manual therapy completed seperately from all other interventions this  date.     Myofascial Release myofascial release, manual stretching to right upper arm, scapular, shoulder, trapezius region to decrease pain and restrictions and improve pain free mobilty .                  OT Education - 01/20/17 1030    Education provided Yes   Education Details A/ROM shoulder exercises and shoulder stretches.    Person(s) Educated Patient   Methods Explanation;Demonstration;Handout;Verbal cues   Comprehension Returned demonstration;Verbalized understanding          OT Short Term Goals - 01/20/17 1056      OT SHORT TERM GOAL #1   Title Patient will be educated on a HEP for improved mobility and strength needed to return to prior level of function.    Time 4   Period Weeks   Status On-going     OT SHORT TERM GOAL #2   Title Patient will imrpove right shoulder P/ROM to WNL for improved ability to complete upper extremity dressing.   Time 4   Period Weeks   Status Achieved     OT SHORT TERM GOAL #3   Title Patient will improve right shoulder strength to 3+/5 for improved ability to reach to shoulder height.    Time 4   Period Weeks   Status On-going     OT SHORT TERM GOAL #4   Title Patient will decrease fascial restrictions from min-mod to min for imrpoved mobility needed to completeion functional activities.    Time 4   Period Weeks   Status Achieved           OT Long Term Goals - 01/06/17 1045      OT LONG TERM GOAL #1   Title Patient will return to prior level of independence with all B/IADLs, work, leisure activities using right arm as dominant.    Time 8   Period Weeks   Status On-going     OT LONG TERM GOAL #2   Title Patient will improve right shoulder A/ROM to WNL in order to reach overhead into cabinets.    Time 8   Period Weeks   Status On-going     OT LONG TERM GOAL #3   Title Patient will improve right shoulder strength to 5/5 for improved ability to lift materials at work.    Time 8   Period Weeks   Status On-going      OT LONG TERM GOAL #4   Title Patient will improve right shoulder mobility by decreasing right shoulder fascial restrictions to trace.    Time 8   Period Weeks   Status On-going  Plan - 01/20/17 1032    Clinical Impression Statement A: Mini reassessment completed this date. Patient reports that he has not attempted to lift his arm although he is able to tell his arm is getting better overall. Strength and A/ROM continue to be deficit. Patient was progressed to A/ROM exercises this session and HEP was updated.    Plan P: Follow up on updatef HEP. Add therapy ball circles.      Patient will benefit from skilled therapeutic intervention in order to improve the following deficits and impairments:  Decreased range of motion, Decreased strength, Increased fascial restricitons, Impaired UE functional use, Increased muscle spasms, Pain  Visit Diagnosis: Stiffness of right shoulder, not elsewhere classified  Acute pain of right shoulder  Other symptoms and signs involving the musculoskeletal system    Problem List There are no active problems to display for this patient.  Limmie Patricia, OTR/L,CBIS  [phone]  01/20/2017, 11:03 AM  [facility] [address] [place], [place], [address] Phone: [phone]   Fax:  [phone]  Name: JAMAR WEATHERALL MRN: [id number] Date of Birth: [birth year]

## 2017-01-20 NOTE — Patient Instructions (Addendum)
1) Shoulder Protraction    Begin with elbows by your side, slowly "punch" straight out in front of you.      2) Shoulder Flexion  Standing:         Begin with arms at your side with thumbs pointed up, slowly raise both arms up and forward towards overhead.      3) Horizontal abduction/adduction   Standing:           Begin with arms straight out in front of you, bring out to the side in at "T" shape. Keep arms straight entire time.      4) Internal & External Rotation    *No band* -Stand with elbows at the side and elbows bent 90 degrees. Move your forearms away from your body, then bring back inward toward the body.     5) Shoulder Abduction  Standing:       Begin with your arms flat on the table next to your side. Slowly move your arms out to the side so that they go overhead, in a jumping jack or snow angel movement.      Repeat all exercises 10-15 times, 1-2 times per day.  Doorway Stretch  Place each hand opposite each other on the doorway. (You can change where you feel the stretch by moving arms higher or lower.) Step through with one foot and bend front knee until a stretch is felt and hold. Step through with the opposite foot on the next rep. Hold for _15-20____ seconds. Repeat _2___times.       Internal Rotation Across Back  Grab the end of a towel with your affected side, palm facing backwards. Grab the towel with your unaffected side and pull your affected hand across your back until you feel a stretch in the front of your shoulder. If you feel pain, pull just to the pain, do not pull through the pain. Hold. Return your affected arm to your side. Try to keep your hand/arm close to your body during the entire movement.        Hold 15-20 seconds. Complete 2 times.     Wall Flexion  Using a towel, slide your arm up the wall until a stretch is felt in your shoulder . Hold 15-20 seconds. Complete 2 times.

## 2017-01-22 ENCOUNTER — Encounter (HOSPITAL_COMMUNITY): Payer: Self-pay

## 2017-01-22 ENCOUNTER — Ambulatory Visit (HOSPITAL_COMMUNITY): Payer: BC Managed Care – PPO

## 2017-01-22 DIAGNOSIS — R29898 Other symptoms and signs involving the musculoskeletal system: Secondary | ICD-10-CM

## 2017-01-22 DIAGNOSIS — M25511 Pain in right shoulder: Secondary | ICD-10-CM | POA: Diagnosis not present

## 2017-01-22 DIAGNOSIS — M25611 Stiffness of right shoulder, not elsewhere classified: Secondary | ICD-10-CM

## 2017-01-22 NOTE — Therapy (Signed)
[place] [address] [place], [place], [address] Phone: [phone]   Fax:  [phone]  Occupational Therapy Treatment  Patient Details  Name: Francisco Fox MRN: [id number] Date of Birth: 06/26/1966 Referring Provider: Dr. Fuller Canada  Encounter Date: 01/22/2017      OT End of Session - 01/22/17 1103    Visit Number 10   Number of Visits 16   Date for OT Re-Evaluation 02/14/17   Authorization Type BCBS State   OT Start Time [phone]   OT Stop Time 1030   OT Time Calculation (min) 35 min   Activity Tolerance Patient tolerated treatment well   Behavior During Therapy [facility] for tasks assessed/performed      Past Medical History:  Diagnosis Date  . Arthritis   . GERD (gastroesophageal reflux disease)     Past Surgical History:  Procedure Laterality Date  . CHOLECYSTECTOMY    . CYST EXCISION Right   . SHOULDER OPEN ROTATOR CUFF REPAIR Right 11/14/2016   Procedure: RIGHT OPEN ROTATOR CUFF REPAIR;  Surgeon: Vickki Hearing, MD;  Location: [facility];  Service: Orthopedics;  Laterality: Right;    There were no vitals filed for this visit.      Subjective Assessment - 01/22/17 1011    Subjective  S: Just a little bit sore in the same spot.   Currently in Pain? Yes   Pain Score 2    Pain Location Shoulder   Pain Orientation Right   Pain Descriptors / Indicators Sore   Pain Type Acute pain   Pain Radiating Towards N/A   Pain Onset In the past 7 days   Pain Frequency Occasional   Aggravating Factors  new exercises and use   Pain Relieving Factors rest   Effect of Pain on Daily Activities min effect            [facility] OT Assessment - 01/22/17 1013      Assessment   Diagnosis S/P RIght Rotator Cuff Repair     Precautions   Precautions Shoulder   Precaution Comments P/ROM 12/16/16-01/20/17, A/ROM 01/20/17- 03/03/17, PRE 03/03/17 on                  OT Treatments/Exercises (OP) - 01/22/17 1014      Exercises   Exercises Shoulder     Shoulder Exercises: Supine   Protraction PROM;5 reps;AROM;10 reps   Horizontal ABduction PROM;5 reps;AROM;10 reps   External Rotation PROM;5 reps;AROM;10 reps   Internal Rotation PROM;5 reps;AROM;10 reps   Flexion PROM;5 reps;AROM;10 reps   ABduction PROM;5 reps;AROM;10 reps     Shoulder Exercises: Standing   Protraction AROM;10 reps   Horizontal ABduction AROM;10 reps   External Rotation AROM;10 reps   Internal Rotation AROM;10 reps   Flexion AROM;10 reps   ABduction AROM;10 reps     Shoulder Exercises: Therapy Ball   Right/Left 5 reps     Shoulder Exercises: ROM/Strengthening   UBE (Upper Arm Bike) Level 1 2' reverse 2' forward   Wall Wash 1'   Over Head Lace 2'   Proximal Shoulder Strengthening, Supine 10X no rest breaks   Proximal Shoulder Strengthening, Seated 10X no rest breaks     Manual Therapy   Manual Therapy Myofascial release   Manual therapy comments manual therapy completed seperately from all other interventions this date.     Myofascial Release myofascial release, manual stretching to right upper arm, scapular, shoulder, trapezius region to decrease pain and restrictions and improve  pain free mobilty .                    OT Short Term Goals - 01/22/17 1107      OT SHORT TERM GOAL #1   Title Patient will be educated on a HEP for improved mobility and strength needed to return to prior level of function.    Time 4   Period Weeks   Status On-going     OT SHORT TERM GOAL #2   Title Patient will imrpove right shoulder P/ROM to WNL for improved ability to complete upper extremity dressing.   Time 4   Period Weeks     OT SHORT TERM GOAL #3   Title Patient will improve right shoulder strength to 3+/5 for improved ability to reach to shoulder height.    Time 4   Period Weeks   Status On-going     OT SHORT TERM GOAL #4   Title Patient will decrease fascial restrictions from min-mod to min for imrpoved mobility  needed to completeion functional activities.    Time 4   Period Weeks           OT Long Term Goals - 01/06/17 1045      OT LONG TERM GOAL #1   Title Patient will return to prior level of independence with all B/IADLs, work, leisure activities using right arm as dominant.    Time 8   Period Weeks   Status On-going     OT LONG TERM GOAL #2   Title Patient will improve right shoulder A/ROM to WNL in order to reach overhead into cabinets.    Time 8   Period Weeks   Status On-going     OT LONG TERM GOAL #3   Title Patient will improve right shoulder strength to 5/5 for improved ability to lift materials at work.    Time 8   Period Weeks   Status On-going     OT LONG TERM GOAL #4   Title Patient will improve right shoulder mobility by decreasing right shoulder fascial restrictions to trace.    Time 8   Period Weeks   Status On-going               Plan - 01/22/17 1105    Clinical Impression Statement A: Added proximal shoulder strengthening, overhead lacing for functional reaching ability, and UBE and therapy ball circles. VC for form and technique. Patient continues to have limitations with standing shoulder flexion and abduction.    Plan P: Follow up on MD appointment. Continue with A/ROM exercises. Work on increasing functional reaching ability. Add X to V arms.      Patient will benefit from skilled therapeutic intervention in order to improve the following deficits and impairments:  Decreased range of motion, Decreased strength, Increased fascial restricitons, Impaired UE functional use, Increased muscle spasms, Pain  Visit Diagnosis: Stiffness of right shoulder, not elsewhere classified  Acute pain of right shoulder  Other symptoms and signs involving the musculoskeletal system    Problem List There are no active problems to display for this patient.  Limmie Patricia, OTR/L,CBIS  [phone]  01/22/2017, 11:08 AM  [place] [address] [place], [place], [address] Phone: [phone]   Fax:  [phone]  Name: DAYLYN AZBILL MRN: [id number] Date of Birth: 1966-08-31

## 2017-01-24 ENCOUNTER — Encounter: Payer: Self-pay | Admitting: Orthopedic Surgery

## 2017-01-24 ENCOUNTER — Ambulatory Visit (INDEPENDENT_AMBULATORY_CARE_PROVIDER_SITE_OTHER): Payer: BC Managed Care – PPO | Admitting: Orthopedic Surgery

## 2017-01-24 DIAGNOSIS — Z4889 Encounter for other specified surgical aftercare: Secondary | ICD-10-CM

## 2017-01-24 DIAGNOSIS — Z9889 Other specified postprocedural states: Secondary | ICD-10-CM

## 2017-01-24 MED ORDER — HYDROCODONE-ACETAMINOPHEN 7.5-325 MG PO TABS: 1.0000 | ORAL_TABLET | Freq: Three times a day (TID) | ORAL | 0 refills | Status: DC | PRN

## 2017-01-24 NOTE — Patient Instructions (Signed)
OOW TILL AUG 8TH

## 2017-01-24 NOTE — Progress Notes (Signed)
Chief Complaint  Patient presents with  . Follow-up    RIGHT RCR, DOS 11/14/16    10 WEEKS POST OP RCR RIGHT SHOULDER   He continues to improve his therapy notes are noted below.  He still has some scapular substitution when he abducts his arm or flexes it but his passive range of motion is very good see notes as noted  Follow-up in 6 weeks  Patient be out of work for 6 months which puts him at 05/14/2017  Meds ordered this encounter  Medications  . HYDROcodone-acetaminophen (NORCO) 7.5-325 MG tablet    Sig: Take 1 tablet by mouth every 8 (eight) hours as needed for moderate pain.    Dispense:  60 tablet    Refill:  0    PT/OT NOTES  AROM   Overall AROM Comments Assessed standing. IR/er adducted. A/ROM not assessed previously.   AROM Assessment Site Shoulder   Right/Left Shoulder Right   Right Shoulder Flexion 120 Degrees   Right Shoulder ABduction 112 Degrees   Right Shoulder Internal Rotation 90 Degrees   Right Shoulder External Rotation 72 Degrees     PROM   Overall PROM Comments assessed in supine, External rotation and internal rotation with shoulder adducted   PROM Assessment Site Shoulder   Right/Left Shoulder Right   Right Shoulder Flexion 140 Degrees  previous: 105   Right Shoulder ABduction 140 Degrees  previous: 102   Right Shoulder Internal Rotation 90 Degrees  previous: 80   Right Shoulder External Rotation 75 Degrees  previous: 70 A: Added proximal shoulder strengthening, overhead lacing for functional reaching ability, and UBE and therapy ball circles. VC for form and technique. Patient continues to have limitations with standing shoulder flexion and abduction.    Plan P: Follow up on MD appointment. Continue with A/ROM exercises. Work on increasing functional reaching ability. Add X to V arms.

## 2017-01-27 ENCOUNTER — Encounter (HOSPITAL_COMMUNITY): Payer: BC Managed Care – PPO

## 2017-01-28 ENCOUNTER — Ambulatory Visit (HOSPITAL_COMMUNITY): Payer: BC Managed Care – PPO | Admitting: Specialist

## 2017-01-29 ENCOUNTER — Ambulatory Visit (HOSPITAL_COMMUNITY): Payer: BC Managed Care – PPO | Admitting: Occupational Therapy

## 2017-01-29 DIAGNOSIS — M25611 Stiffness of right shoulder, not elsewhere classified: Secondary | ICD-10-CM

## 2017-01-29 DIAGNOSIS — M25511 Pain in right shoulder: Secondary | ICD-10-CM

## 2017-01-29 DIAGNOSIS — R29898 Other symptoms and signs involving the musculoskeletal system: Secondary | ICD-10-CM

## 2017-01-29 NOTE — Therapy (Signed)
[facility] [address] [place], [place], [address] Phone: [phone]   Fax:  [phone]  Occupational Therapy Treatment  Patient Details  Name: Francisco Fox MRN: [id number] Date of Birth: [birth year] Referring Provider: Dr. Fuller Canada  Encounter Date: 01/29/2017      OT End of Session - 01/29/17 1627    Visit Number 11   Number of Visits 16   Date for OT Re-Evaluation 02/14/17   Authorization Type BCBS State   OT Start Time [phone]   OT Stop Time 1029   OT Time Calculation (min) 41 min   Activity Tolerance Patient tolerated treatment well   Behavior During Therapy [facility] for tasks assessed/performed      Past Medical History:  Diagnosis Date  . Arthritis   . GERD (gastroesophageal reflux disease)     Past Surgical History:  Procedure Laterality Date  . CHOLECYSTECTOMY    . CYST EXCISION Right   . SHOULDER OPEN ROTATOR CUFF REPAIR Right 11/14/2016   Procedure: RIGHT OPEN ROTATOR CUFF REPAIR;  Surgeon: Vickki Hearing, MD;  Location: [facility];  Service: Orthopedics;  Laterality: Right;    There were no vitals filed for this visit.      Subjective Assessment - 01/29/17 0951    Subjective  S: The doctor was really pleased.    Currently in Pain? Yes   Pain Score 1    Pain Location Shoulder   Pain Orientation Right   Pain Descriptors / Indicators Sore   Pain Type Acute pain   Pain Onset 1 to 4 weeks ago   Pain Frequency Occasional   Aggravating Factors  movement, use   Pain Relieving Factors rest   Effect of Pain on Daily Activities min effect   Multiple Pain Sites No            [facility] OT Assessment - 01/29/17 1003      Assessment   Diagnosis S/P RIght Rotator Cuff Repair     Precautions   Precautions Shoulder   Precaution Comments P/ROM 12/16/16-01/20/17, A/ROM 01/20/17- 03/03/17, PRE 03/03/17 on                  OT Treatments/Exercises (OP) - 01/29/17 1003      Exercises   Exercises  Shoulder     Shoulder Exercises: Supine   Protraction PROM;5 reps;AROM;10 reps   Horizontal ABduction PROM;5 reps;AROM;10 reps   External Rotation PROM;5 reps;AROM;10 reps   Internal Rotation PROM;5 reps;AROM;10 reps   Flexion PROM;5 reps;AROM;10 reps   ABduction PROM;5 reps;AROM;10 reps     Shoulder Exercises: Standing   Protraction AROM;10 reps   Horizontal ABduction AROM;10 reps   External Rotation AROM;10 reps   Internal Rotation AROM;10 reps   Flexion AROM;10 reps   ABduction AROM;10 reps   Extension Theraband;10 reps   Theraband Level (Shoulder Extension) Level 2 (Red)   Row Theraband;10 reps   Theraband Level (Shoulder Row) Level 2 (Red)   Retraction Theraband;10 reps   Theraband Level (Shoulder Retraction) Level 2 (Red)     Shoulder Exercises: ROM/Strengthening   UBE (Upper Arm Bike) Level 1 2' reverse 2' forward   X to V Arms 10X   Proximal Shoulder Strengthening, Supine 10X no rest breaks   Proximal Shoulder Strengthening, Seated 10X no rest breaks     Shoulder Exercises: Stretch   Corner Stretch 2 reps;20 seconds   Internal Rotation Stretch 2 reps  20"   Wall Stretch - Flexion 2 reps;20  seconds     Manual Therapy   Manual Therapy Myofascial release   Manual therapy comments manual therapy completed seperately from all other interventions this date.     Myofascial Release myofascial release, manual stretching to right upper arm, scapular, shoulder, trapezius region to decrease pain and restrictions and improve pain free mobilty .                    OT Short Term Goals - 01/22/17 1107      OT SHORT TERM GOAL #1   Title Patient will be educated on a HEP for improved mobility and strength needed to return to prior level of function.    Time 4   Period Weeks   Status On-going     OT SHORT TERM GOAL #2   Title Patient will imrpove right shoulder P/ROM to WNL for improved ability to complete upper extremity dressing.   Time 4   Period Weeks     OT  SHORT TERM GOAL #3   Title Patient will improve right shoulder strength to 3+/5 for improved ability to reach to shoulder height.    Time 4   Period Weeks   Status On-going     OT SHORT TERM GOAL #4   Title Patient will decrease fascial restrictions from min-mod to min for imrpoved mobility needed to completeion functional activities.    Time 4   Period Weeks           OT Long Term Goals - 01/06/17 1045      OT LONG TERM GOAL #1   Title Patient will return to prior level of independence with all B/IADLs, work, leisure activities using right arm as dominant.    Time 8   Period Weeks   Status On-going     OT LONG TERM GOAL #2   Title Patient will improve right shoulder A/ROM to WNL in order to reach overhead into cabinets.    Time 8   Period Weeks   Status On-going     OT LONG TERM GOAL #3   Title Patient will improve right shoulder strength to 5/5 for improved ability to lift materials at work.    Time 8   Period Weeks   Status On-going     OT LONG TERM GOAL #4   Title Patient will improve right shoulder mobility by decreasing right shoulder fascial restrictions to trace.    Time 8   Period Weeks   Status On-going               Plan - 01/29/17 1627    Clinical Impression Statement A: Pt reports MD was pleased with progress. Continued with A/ROM, adding scapular theraband and x to v arms this session. Pt required initial verbal cuing for form, able to improve independence with completion. Continues to be limited with range of shoulder flexion and abduction.    Plan P: Functional reaching task, add theraband to HEP when appropriate    OT Home Exercise Plan 12/16/16:  table slides   Consulted and Agree with Plan of Care Patient      Patient will benefit from skilled therapeutic intervention in order to improve the following deficits and impairments:     Visit Diagnosis: Stiffness of right shoulder, not elsewhere classified  Acute pain of right  shoulder  Other symptoms and signs involving the musculoskeletal system    Problem List There are no active problems to display for this patient.  Ezra Sites, OTR/L  [phone] 01/29/2017, 4:29 PM  [facility] [address] [place], [place], [address] Phone: [phone]   Fax:  [phone]  Name: Francisco Fox MRN: [id number] Date of Birth: June 01, 1966

## 2017-01-30 ENCOUNTER — Encounter (HOSPITAL_COMMUNITY): Payer: Self-pay | Admitting: Occupational Therapy

## 2017-01-30 ENCOUNTER — Ambulatory Visit (HOSPITAL_COMMUNITY): Payer: BC Managed Care – PPO | Admitting: Occupational Therapy

## 2017-01-30 DIAGNOSIS — M25511 Pain in right shoulder: Secondary | ICD-10-CM

## 2017-01-30 DIAGNOSIS — M25611 Stiffness of right shoulder, not elsewhere classified: Secondary | ICD-10-CM

## 2017-01-30 DIAGNOSIS — R29898 Other symptoms and signs involving the musculoskeletal system: Secondary | ICD-10-CM

## 2017-01-30 NOTE — Therapy (Signed)
[facility] [address] [place], [place], [address] Phone: [phone]   Fax:  [phone]  Occupational Therapy Treatment  Patient Details  Name: GARVIS DOWNUM MRN: [id number] Date of Birth: 1966/08/16 Referring Provider: Dr. Fuller Canada  Encounter Date: 01/30/2017      OT End of Session - 01/30/17 1026    Visit Number 12   Number of Visits 16   Date for OT Re-Evaluation 02/14/17   Authorization Type BCBS State   OT Start Time [phone]   OT Stop Time 1029   OT Time Calculation (min) 39 min   Activity Tolerance Patient tolerated treatment well   Behavior During Therapy [facility] for tasks assessed/performed      Past Medical History:  Diagnosis Date  . Arthritis   . GERD (gastroesophageal reflux disease)     Past Surgical History:  Procedure Laterality Date  . CHOLECYSTECTOMY    . CYST EXCISION Right   . SHOULDER OPEN ROTATOR CUFF REPAIR Right 11/14/2016   Procedure: RIGHT OPEN ROTATOR CUFF REPAIR;  Surgeon: Vickki Hearing, MD;  Location: [facility];  Service: Orthopedics;  Laterality: Right;    There were no vitals filed for this visit.      Subjective Assessment - 01/30/17 0951    Subjective  S: I'm feeling good today.    Currently in Pain? Yes   Pain Score 1    Pain Location Shoulder   Pain Orientation Right   Pain Descriptors / Indicators Sore   Pain Type Acute pain   Pain Radiating Towards n/a   Pain Onset 1 to 4 weeks ago   Pain Frequency Occasional   Aggravating Factors  movement, use   Pain Relieving Factors rest    Effect of Pain on Daily Activities min effect   Multiple Pain Sites No            [facility] OT Assessment - 01/30/17 0951      Assessment   Diagnosis S/P RIght Rotator Cuff Repair     Precautions   Precautions Shoulder   Precaution Comments P/ROM 12/16/16-01/20/17, A/ROM 01/20/17- 03/03/17, PRE 03/03/17 on                  OT Treatments/Exercises (OP) - 01/30/17 0953      Exercises   Exercises Shoulder     Shoulder Exercises: Supine   Protraction PROM;5 reps;AROM;12 reps   Horizontal ABduction PROM;5 reps;AROM;12 reps   External Rotation PROM;5 reps;AROM;12 reps   Internal Rotation PROM;5 reps;AROM;12 reps   Flexion PROM;5 reps;AROM;12 reps   ABduction PROM;5 reps;AROM;12 reps     Shoulder Exercises: Standing   Protraction AROM;12 reps   Horizontal ABduction AROM;12 reps   External Rotation AROM;12 reps   Internal Rotation AROM;12 reps   Flexion AROM;12 reps   ABduction AROM;12 reps   Extension Theraband;10 reps   Theraband Level (Shoulder Extension) Level 2 (Red)   Row Theraband;10 reps   Theraband Level (Shoulder Row) Level 2 (Red)   Retraction Theraband;10 reps   Theraband Level (Shoulder Retraction) Level 2 (Red)     Shoulder Exercises: ROM/Strengthening   UBE (Upper Arm Bike) Level 1 2' reverse 2' forward   X to V Arms 10X   Proximal Shoulder Strengthening, Supine 10X no rest breaks   Proximal Shoulder Strengthening, Seated 10X no rest breaks     Functional Reaching Activities   High Level Pt placed and removed 10 cones from top of refridgerator in ADL room while reaching  into shoulder flexion and abduction. Min difficulty with flexion, mod difficulty with abduction.      Manual Therapy   Manual Therapy Myofascial release   Manual therapy comments manual therapy completed seperately from all other interventions this date.     Myofascial Release myofascial release, manual stretching to right upper arm, scapular, shoulder, trapezius region to decrease pain and restrictions and improve pain free mobilty .                    OT Short Term Goals - 01/22/17 1107      OT SHORT TERM GOAL #1   Title Patient will be educated on a HEP for improved mobility and strength needed to return to prior level of function.    Time 4   Period Weeks   Status On-going     OT SHORT TERM GOAL #2   Title Patient will imrpove right shoulder P/ROM  to WNL for improved ability to complete upper extremity dressing.   Time 4   Period Weeks     OT SHORT TERM GOAL #3   Title Patient will improve right shoulder strength to 3+/5 for improved ability to reach to shoulder height.    Time 4   Period Weeks   Status On-going     OT SHORT TERM GOAL #4   Title Patient will decrease fascial restrictions from min-mod to min for imrpoved mobility needed to completeion functional activities.    Time 4   Period Weeks           OT Long Term Goals - 01/06/17 1045      OT LONG TERM GOAL #1   Title Patient will return to prior level of independence with all B/IADLs, work, leisure activities using right arm as dominant.    Time 8   Period Weeks   Status On-going     OT LONG TERM GOAL #2   Title Patient will improve right shoulder A/ROM to WNL in order to reach overhead into cabinets.    Time 8   Period Weeks   Status On-going     OT LONG TERM GOAL #3   Title Patient will improve right shoulder strength to 5/5 for improved ability to lift materials at work.    Time 8   Period Weeks   Status On-going     OT LONG TERM GOAL #4   Title Patient will improve right shoulder mobility by decreasing right shoulder fascial restrictions to trace.    Time 8   Period Weeks   Status On-going               Plan - 01/30/17 1029    Clinical Impression Statement A: Increased A/ROM to 12 repetitions this session, added functional reaching task, pt continues to have difficulty with end range flexion and abduction. Pt with good form, occasional cuing for depressing trapezius.    Plan P: Add red theraband scapular strengthening to HEP, add A/ROM sidelying    OT Home Exercise Plan 12/16/16:  table slides   Consulted and Agree with Plan of Care Patient      Patient will benefit from skilled therapeutic intervention in order to improve the following deficits and impairments:  Decreased range of motion, Decreased strength, Increased fascial  restricitons, Impaired UE functional use, Increased muscle spasms, Pain  Visit Diagnosis: Stiffness of right shoulder, not elsewhere classified  Acute pain of right shoulder  Other symptoms and signs involving the musculoskeletal system  Problem List There are no active problems to display for this patient.  Ezra Sites, OTR/L  [phone] 01/30/2017, 10:33 AM  [facility] [address] [place], [place], [address] Phone: [phone]   Fax:  [phone]  Name: RITCHARD PARAGAS MRN: [id number] Date of Birth: 1965-11-16

## 2017-02-03 ENCOUNTER — Ambulatory Visit (HOSPITAL_COMMUNITY): Payer: BC Managed Care – PPO | Admitting: Specialist

## 2017-02-03 DIAGNOSIS — M25511 Pain in right shoulder: Secondary | ICD-10-CM | POA: Diagnosis not present

## 2017-02-03 DIAGNOSIS — M25611 Stiffness of right shoulder, not elsewhere classified: Secondary | ICD-10-CM

## 2017-02-03 NOTE — Therapy (Signed)
[facility] [address] [place], [place], [address] Phone: [phone]   Fax:  [phone]  Occupational Therapy Treatment  Patient Details  Name: Francisco Fox MRN: [id number] Date of Birth: [birth year] Referring Provider: Dr. Fuller Canada  Encounter Date: 02/03/2017      OT End of Session - 02/03/17 1030    Visit Number 13   Number of Visits 16   Date for OT Re-Evaluation 02/14/17   Authorization Type BCBS State   OT Start Time [phone]  arrived late for 945 appt   OT Stop Time 1032   OT Time Calculation (min) 33 min   Activity Tolerance Patient tolerated treatment well   Behavior During Therapy [facility] for tasks assessed/performed      Past Medical History:  Diagnosis Date  . Arthritis   . GERD (gastroesophageal reflux disease)     Past Surgical History:  Procedure Laterality Date  . CHOLECYSTECTOMY    . CYST EXCISION Right   . SHOULDER OPEN ROTATOR CUFF REPAIR Right 11/14/2016   Procedure: RIGHT OPEN ROTATOR CUFF REPAIR;  Surgeon: Vickki Hearing, MD;  Location: [facility];  Service: Orthopedics;  Laterality: Right;    There were no vitals filed for this visit.      Subjective Assessment - 02/03/17 1000    Subjective  S:  No pain today   Currently in Pain? No/denies            [facility] OT Assessment - 02/03/17 0001      Assessment   Diagnosis S/P RIght Rotator Cuff Repair     Precautions   Precautions Shoulder   Precaution Comments P/ROM 12/16/16-01/20/17, A/ROM 01/20/17- 03/03/17, PRE 03/03/17 on                  OT Treatments/Exercises (OP) - 02/03/17 0001      Exercises   Exercises Shoulder     Shoulder Exercises: Supine   Protraction PROM;5 reps;AROM;15 reps   Horizontal ABduction PROM;5 reps;AROM;15 reps   External Rotation PROM;5 reps;AROM;15 reps   Internal Rotation PROM;5 reps;AROM;15 reps   Flexion PROM;5 reps;AROM;15 reps   ABduction PROM;5 reps;AROM;15 reps     Shoulder Exercises:  Sidelying   External Rotation AROM;10 reps   Internal Rotation AROM;10 reps   Flexion AROM;10 reps   ABduction AROM;10 reps   ABduction Limitations a/rom protraction and retraction 10 times      Shoulder Exercises: ROM/Strengthening   UBE (Upper Arm Bike) Level 1 2' reverse 2' forward   Over Head Lace 2'   "W" Arms 10 X   X to V Arms 10X   Proximal Shoulder Strengthening, Supine 10X no rest breaks   Proximal Shoulder Strengthening, Seated 10X no rest breaks     Manual Therapy   Manual Therapy Myofascial release   Manual therapy comments manual therapy completed seperately from all other interventions this date.     Myofascial Release myofascial release, manual stretching to right upper arm, scapular, shoulder, trapezius region to decrease pain and restrictions and improve pain free mobilty .                    OT Short Term Goals - 01/22/17 1107      OT SHORT TERM GOAL #1   Title Patient will be educated on a HEP for improved mobility and strength needed to return to prior level of function.    Time 4   Period Weeks   Status On-going  OT SHORT TERM GOAL #2   Title Patient will imrpove right shoulder P/ROM to WNL for improved ability to complete upper extremity dressing.   Time 4   Period Weeks     OT SHORT TERM GOAL #3   Title Patient will improve right shoulder strength to 3+/5 for improved ability to reach to shoulder height.    Time 4   Period Weeks   Status On-going     OT SHORT TERM GOAL #4   Title Patient will decrease fascial restrictions from min-mod to min for imrpoved mobility needed to completeion functional activities.    Time 4   Period Weeks           OT Long Term Goals - 01/06/17 1045      OT LONG TERM GOAL #1   Title Patient will return to prior level of independence with all B/IADLs, work, leisure activities using right arm as dominant.    Time 8   Period Weeks   Status On-going     OT LONG TERM GOAL #2   Title Patient will  improve right shoulder A/ROM to WNL in order to reach overhead into cabinets.    Time 8   Period Weeks   Status On-going     OT LONG TERM GOAL #3   Title Patient will improve right shoulder strength to 5/5 for improved ability to lift materials at work.    Time 8   Period Weeks   Status On-going     OT LONG TERM GOAL #4   Title Patient will improve right shoulder mobility by decreasing right shoulder fascial restrictions to trace.    Time 8   Period Weeks   Status On-going               Plan - 02/03/17 1030    Clinical Impression Statement A: added sidelying A/ROM with therapist providing stretch and mobilization at end range for greater range.  noted clicking with flexion and abduction in shoudler joint, with no pain associated.     Plan P:  add red theraband to HEP.  Increase repetitions with sidelying a/rom, attempt ball circles for improved range.  add pinch tree for greater overhead functional reach.      Patient will benefit from skilled therapeutic intervention in order to improve the following deficits and impairments:  Decreased range of motion, Decreased strength, Increased fascial restricitons, Impaired UE functional use, Increased muscle spasms, Pain  Visit Diagnosis: Stiffness of right shoulder, not elsewhere classified    Problem List There are no active problems to display for this patient.   Shirlean Mylar, MHA, OTR/L [phone]  02/03/2017, 10:33 AM  [facility] [address] [place], [place], [address] Phone: [phone]   Fax:  [phone]  Name: Francisco Fox MRN: [id number] Date of Birth: [birth year]

## 2017-02-07 ENCOUNTER — Ambulatory Visit (HOSPITAL_COMMUNITY): Payer: BC Managed Care – PPO | Attending: Orthopedic Surgery | Admitting: Occupational Therapy

## 2017-02-07 ENCOUNTER — Encounter (HOSPITAL_COMMUNITY): Payer: Self-pay | Admitting: Occupational Therapy

## 2017-02-07 DIAGNOSIS — R29898 Other symptoms and signs involving the musculoskeletal system: Secondary | ICD-10-CM

## 2017-02-07 DIAGNOSIS — M25511 Pain in right shoulder: Secondary | ICD-10-CM | POA: Diagnosis present

## 2017-02-07 DIAGNOSIS — M25611 Stiffness of right shoulder, not elsewhere classified: Secondary | ICD-10-CM | POA: Diagnosis present

## 2017-02-07 NOTE — Patient Instructions (Signed)
Complete 1-2X/day  (Home) Extension: Isometric / Bilateral Arm Retraction - Sitting   Facing anchor, hold hands and elbow at shoulder height, with elbow bent.  Pull arms back to squeeze shoulder blades together. Repeat 10-15 times.  Copyright  VHI. All rights reserved.   (Home) Retraction: Row - Bilateral (Anchor)   Facing anchor, arms reaching forward, pull hands toward stomach, keeping elbows bent and at your sides and pinching shoulder blades together. Repeat 10-15 times.  Copyright  VHI. All rights reserved.   (Clinic) Extension / Flexion (Assist)   Face anchor, pull arms back, keeping elbow straight, and squeze shoulder blades together. Repeat 10-15 times.   Copyright  VHI. All rights reserved.   

## 2017-02-07 NOTE — Therapy (Signed)
[facility] [address] [place], [place], [address] Phone: [phone]   Fax:  [phone]  Occupational Therapy Treatment  Patient Details  Name: Francisco Fox MRN: [id number] Date of Birth: November 16, 1965 Referring Provider: Dr. Fuller CanadaStanley Harrison  Encounter Date: 02/07/2017      OT End of Session - 02/07/17 0857    Visit Number 14   Number of Visits 16   Date for OT Re-Evaluation 02/14/17   Authorization Type BCBS State   OT Start Time [phone]   OT Stop Time 0858   OT Time Calculation (min) 42 min   Activity Tolerance Patient tolerated treatment well   Behavior [facility] Therapy [name] for tasks assessed/performed      Past Medical History:  Diagnosis Date  . Arthritis   . GERD (gastroesophageal reflux disease)     Past Surgical History:  Procedure Laterality Date  . CHOLECYSTECTOMY    . CYST EXCISION Right   . SHOULDER OPEN ROTATOR CUFF REPAIR Right 11/14/2016   Procedure: RIGHT OPEN ROTATOR CUFF REPAIR;  Surgeon: Vickki HearingStanley E Harrison, MD;  Location: [facility];  Service: Orthopedics;  Laterality: Right;    There were no vitals filed for this visit.      Subjective Assessment - 02/07/17 0809    Subjective  S: The exercises are still going well.    Currently in Pain? No/denies            [facility] - 02/07/17 0809      Assessment   Diagnosis S/P RIght Rotator Cuff Repair     Precautions   Precautions Shoulder   Precaution Comments P/ROM 12/16/16-01/20/17, A/ROM 01/20/17- 03/03/17, PRE 03/03/17 on                  OT Treatments/Exercises (OP) - 02/07/17 0818      Exercises   Exercises Shoulder     Shoulder Exercises: Supine   Protraction PROM;5 reps;AROM;15 reps   Horizontal ABduction PROM;5 reps;AROM;15 reps   External Rotation PROM;5 reps;AROM;15 reps   Internal Rotation PROM;5 reps;AROM;15 reps   Flexion PROM;5 reps;AROM;15 reps   ABduction PROM;5 reps;AROM;15 reps     Shoulder Exercises:  Sidelying   External Rotation AROM;10 reps   Internal Rotation AROM;10 reps   Flexion AROM;10 reps   ABduction AROM;10 reps   ABduction Limitations a/rom protraction and retraction 10 times    Other Sidelying Exercises horizontal abduction, 10X     Shoulder Exercises: Standing   Protraction AROM;12 reps   Horizontal ABduction AROM;12 reps   External Rotation AROM;12 reps   Internal Rotation AROM;12 reps   Flexion AROM;12 reps   ABduction AROM;12 reps   Extension Theraband;10 reps   Theraband Level (Shoulder Extension) Level 2 (Red)   Row Theraband;10 reps   Theraband Level (Shoulder Row) Level 2 (Red)   Retraction Theraband;10 reps   Theraband Level (Shoulder Retraction) Level 2 (Red)     Shoulder Exercises: ROM/Strengthening   "W" Arms 10X   X to V Arms 12X   Proximal Shoulder Strengthening, Supine 15X each no rest breaks   Ball on Wall 1' flexion 1' abduction     Shoulder Exercises: Stretch   Corner Stretch 2 reps;30 seconds   Internal Rotation Stretch 2 reps  30"   Wall Stretch - Flexion 2 reps;30 seconds     Manual Therapy   Manual Therapy Myofascial release   Manual therapy comments manual therapy completed seperately from all other interventions this date.  Myofascial Release myofascial release, manual stretching to right upper arm, scapular, shoulder, trapezius region to decrease pain and restrictions and improve pain free mobilty .                  OT Education - 02/07/17 0847    Education provided Yes   Education Details red scapular theraband   Person(s) Educated Patient   Methods Explanation;Demonstration;Handout   Comprehension Verbalized understanding;Returned demonstration          OT Short Term Goals - 01/22/17 1107      OT SHORT TERM GOAL #1   Title Patient will be educated on a HEP for improved mobility and strength needed to return to prior level of function.    Time 4   Period Weeks   Status On-going     OT SHORT TERM GOAL #2    Title Patient will imrpove right shoulder P/ROM to WNL for improved ability to complete upper extremity dressing.   Time 4   Period Weeks     OT SHORT TERM GOAL #3   Title Patient will improve right shoulder strength to 3+/5 for improved ability to reach to shoulder height.    Time 4   Period Weeks   Status On-going     OT SHORT TERM GOAL #4   Title Patient will decrease fascial restrictions from min-mod to min for imrpoved mobility needed to completeion functional activities.    Time 4   Period Weeks           OT Long Term Goals - 01/06/17 1045      OT LONG TERM GOAL #1   Title Patient will return to prior level of independence with all B/IADLs, work, leisure activities using right arm as dominant.    Time 8   Period Weeks   Status On-going     OT LONG TERM GOAL #2   Title Patient will improve right shoulder A/ROM to WNL in order to reach overhead into cabinets.    Time 8   Period Weeks   Status On-going     OT LONG TERM GOAL #3   Title Patient will improve right shoulder strength to 5/5 for improved ability to lift materials at work.    Time 8   Period Weeks   Status On-going     OT LONG TERM GOAL #4   Title Patient will improve right shoulder mobility by decreasing right shoulder fascial restrictions to trace.    Time 8   Period Weeks   Status On-going               Plan - 02/07/17 1610    Clinical Impression Statement A: Continued with A/ROM exercises this session, pt completing in supine, sidelying, and standing with good form, intermittent cuing for technique. Added ball on wall, resumed stretches with increased time for hold. Updated HEP for red theraband.    Plan P: Add ball circles for improve IR, follow up on HEP. Pinch tree for improved overhead reaching   OT Home Exercise Plan 12/16/16:  table slides; 5/4: red scapular theraband   Consulted and Agree with Plan of Care Patient;Family member/caregiver   Family Member Consulted spouse       Patient will benefit from skilled therapeutic intervention in order to improve the following deficits and impairments:  Decreased range of motion, Decreased strength, Increased fascial restricitons, Impaired UE functional use, Increased muscle spasms, Pain  Visit Diagnosis: Stiffness of right shoulder, not elsewhere classified  Acute  pain of right shoulder  Other symptoms and signs involving the musculoskeletal system    Problem List There are no active problems to display for this patient.  Ezra Sites, OTR/L  [phone] 02/07/2017, 11:41 AM  [facility] [address] [place], [place], [address] Phone: [phone]   Fax:  [phone]  Name: Francisco Fox MRN: [id number] Date of Birth: 04-Jan-1966

## 2017-02-10 ENCOUNTER — Ambulatory Visit (HOSPITAL_COMMUNITY): Payer: BC Managed Care – PPO | Admitting: Occupational Therapy

## 2017-02-10 ENCOUNTER — Encounter (HOSPITAL_COMMUNITY): Payer: Self-pay | Admitting: Occupational Therapy

## 2017-02-10 DIAGNOSIS — M25611 Stiffness of right shoulder, not elsewhere classified: Secondary | ICD-10-CM

## 2017-02-10 DIAGNOSIS — M25511 Pain in right shoulder: Secondary | ICD-10-CM

## 2017-02-10 DIAGNOSIS — R29898 Other symptoms and signs involving the musculoskeletal system: Secondary | ICD-10-CM

## 2017-02-10 NOTE — Therapy (Signed)
[facility] [address] [place], [place], [address] Phone: [phone]   Fax:  [phone]  Occupational Therapy Treatment  Patient Details  Name: Francisco Fox MRN: [id number] Date of Birth: Feb 15, 1966 Referring Provider: Dr. Fuller Canada  Encounter Date: 02/10/2017      OT End of Session - 02/10/17 1115    Visit Number 15   Number of Visits 16   Date for OT Re-Evaluation 02/14/17   Authorization Type BCBS State   OT Start Time 1034   OT Stop Time 1114   OT Time Calculation (min) 40 min   Activity Tolerance Patient tolerated treatment well   Behavior During Therapy [facility] for tasks assessed/performed      Past Medical History:  Diagnosis Date  . Arthritis   . GERD (gastroesophageal reflux disease)     Past Surgical History:  Procedure Laterality Date  . CHOLECYSTECTOMY    . CYST EXCISION Right   . SHOULDER OPEN ROTATOR CUFF REPAIR Right 11/14/2016   Procedure: RIGHT OPEN ROTATOR CUFF REPAIR;  Surgeon: Vickki Hearing, MD;  Location: [facility];  Service: Orthopedics;  Laterality: Right;    There were no vitals filed for this visit.      Subjective Assessment - 02/10/17 1038    Subjective  S: I played cornhole the other day with no problems.   Currently in Pain? No/denies            [facility] OT Assessment - 02/10/17 1038      Assessment   Diagnosis S/P RIght Rotator Cuff Repair     Precautions   Precautions Shoulder   Precaution Comments P/ROM 12/16/16-01/20/17, A/ROM 01/20/17- 03/03/17, PRE 03/03/17 on                  OT Treatments/Exercises (OP) - 02/10/17 1047      Exercises   Exercises Shoulder     Shoulder Exercises: Supine   Protraction PROM;5 reps;AROM;15 reps   Horizontal ABduction PROM;5 reps;AROM;15 reps   External Rotation PROM;5 reps;AROM;15 reps   Internal Rotation PROM;5 reps;AROM;15 reps   Flexion PROM;5 reps;AROM;15 reps   ABduction PROM;5 reps;AROM;15 reps     Shoulder  Exercises: Sidelying   External Rotation AROM;12 reps   Internal Rotation AROM;12 reps   Flexion AROM;12 reps   ABduction AROM;12 reps   ABduction Limitations a/rom protraction and retraction 12 times    Other Sidelying Exercises horizontal abduction, 12X     Shoulder Exercises: Standing   Extension Theraband;10 reps   Theraband Level (Shoulder Extension) Level 2 (Red)   Row Theraband;10 reps   Theraband Level (Shoulder Row) Level 2 (Red)   Retraction Theraband;10 reps   Theraband Level (Shoulder Retraction) Level 2 (Red)     Shoulder Exercises: ROM/Strengthening   UBE (Upper Arm Bike) Level 1 3' reverse 3' forward   Proximal Shoulder Strengthening, Supine 15X each no rest breaks   Ball on Wall 1' flexion 1' abduction     Shoulder Exercises: Stretch   Internal Rotation Stretch 2 reps  30 seconds     Manual Therapy   Manual Therapy Myofascial release   Manual therapy comments manual therapy completed seperately from all other interventions this date.     Myofascial Release myofascial release, manual stretching to right upper arm, scapular, shoulder, trapezius region to decrease pain and restrictions and improve pain free mobilty .  OT Short Term Goals - 01/22/17 1107      OT SHORT TERM GOAL #1   Title Patient will be educated on a HEP for improved mobility and strength needed to return to prior level of function.    Time 4   Period Weeks   Status On-going     OT SHORT TERM GOAL #2   Title Patient will imrpove right shoulder P/ROM to WNL for improved ability to complete upper extremity dressing.   Time 4   Period Weeks     OT SHORT TERM GOAL #3   Title Patient will improve right shoulder strength to 3+/5 for improved ability to reach to shoulder height.    Time 4   Period Weeks   Status On-going     OT SHORT TERM GOAL #4   Title Patient will decrease fascial restrictions from min-mod to min for imrpoved mobility needed to completeion  functional activities.    Time 4   Period Weeks           OT Long Term Goals - 01/06/17 1045      OT LONG TERM GOAL #1   Title Patient will return to prior level of independence with all B/IADLs, work, leisure activities using right arm as dominant.    Time 8   Period Weeks   Status On-going     OT LONG TERM GOAL #2   Title Patient will improve right shoulder A/ROM to WNL in order to reach overhead into cabinets.    Time 8   Period Weeks   Status On-going     OT LONG TERM GOAL #3   Title Patient will improve right shoulder strength to 5/5 for improved ability to lift materials at work.    Time 8   Period Weeks   Status On-going     OT LONG TERM GOAL #4   Title Patient will improve right shoulder mobility by decreasing right shoulder fascial restrictions to trace.    Time 8   Period Weeks   Status On-going               Plan - 02/10/17 1115    Clinical Impression Statement A: Added functional reaching, completed IR stretch with towel, continued with A/ROM and scapular strengthening. Pt with good form, intermittent verbal cuing for depressing shoulder and technqiue. Pt reports scapular theraband is going well, cuing for form with retraction.    Plan P: Reassessment and possible discharge.    OT Home Exercise Plan 12/16/16:  table slides; 5/4: red scapular theraband   Consulted and Agree with Plan of Care Patient      Patient will benefit from skilled therapeutic intervention in order to improve the following deficits and impairments:  Decreased range of motion, Decreased strength, Increased fascial restricitons, Impaired UE functional use, Increased muscle spasms, Pain  Visit Diagnosis: Stiffness of right shoulder, not elsewhere classified  Acute pain of right shoulder  Other symptoms and signs involving the musculoskeletal system    Problem List There are no active problems to display for this patient.  Ezra SitesLeslie Troxler, OTR/L  [phone] 02/10/2017,  11:56 AM  [facility] [address] [place], [place], [address] Phone: [phone]   Fax:  [phone]  Name: Francisco DrainLarry L Fox MRN: [id number] Date of Birth: 1965/12/06

## 2017-02-12 ENCOUNTER — Encounter (HOSPITAL_COMMUNITY): Payer: Self-pay | Admitting: Occupational Therapy

## 2017-02-12 ENCOUNTER — Ambulatory Visit (HOSPITAL_COMMUNITY): Payer: BC Managed Care – PPO | Admitting: Occupational Therapy

## 2017-02-12 DIAGNOSIS — M25511 Pain in right shoulder: Secondary | ICD-10-CM

## 2017-02-12 DIAGNOSIS — R29898 Other symptoms and signs involving the musculoskeletal system: Secondary | ICD-10-CM

## 2017-02-12 DIAGNOSIS — M25611 Stiffness of right shoulder, not elsewhere classified: Secondary | ICD-10-CM | POA: Diagnosis not present

## 2017-02-12 NOTE — Therapy (Signed)
[place], [name], [address] Phone: [phone]   Fax:  [phone]  Occupational Therapy Reassessment and Treatment, Recertification  Patient Details  Name: Francisco Fox MRN: [id number] Date of Birth: 06-29-1966 Referring Provider: Dr. Arther Abbott  Encounter Date: 02/12/2017      OT End of Session - 02/12/17 0903    Visit Number 16   Number of Visits 21   Date for OT Re-Evaluation 03/07/17   Authorization Type BCBS State   OT Start Time 0820   OT Stop Time 0900   OT Time Calculation (min) 40 min   Activity Tolerance Patient tolerated treatment well   Behavior During Therapy [facility] for tasks assessed/performed      Past Medical History:  Diagnosis Date  . Arthritis   . GERD (gastroesophageal reflux disease)     Past Surgical History:  Procedure Laterality Date  . CHOLECYSTECTOMY    . CYST EXCISION Right   . SHOULDER OPEN ROTATOR CUFF REPAIR Right 11/14/2016   Procedure: RIGHT OPEN ROTATOR CUFF REPAIR;  Surgeon: Carole Civil, MD;  Location: [facility];  Service: Orthopedics;  Laterality: Right;    There were no vitals filed for this visit.      Subjective Assessment - 02/12/17 0821    Subjective  S: I'm still just having difficulty reaching overhead.    Currently in Pain? No/denies            [facility] OT Assessment - 02/12/17 0820      Assessment   Diagnosis S/P RIght Rotator Cuff Repair     Precautions   Precautions Shoulder   Precaution Comments P/ROM 12/16/16-01/20/17, A/ROM 01/20/17- 03/03/17, PRE 03/03/17 on     Palpation   Palpation comment min fascial restrictions noted in right shoulder region      AROM   Overall AROM Comments Assessed standing. IR/er adducted. A/ROM not assessed previously.   AROM Assessment Site Shoulder   Right/Left Shoulder Right   Right Shoulder Flexion 140 Degrees  120 previous   Right Shoulder ABduction 139 Degrees  112 previous   Right Shoulder  Internal Rotation 90 Degrees  same as previous   Right Shoulder External Rotation 78 Degrees  72 previous     PROM   Overall PROM Comments assessed in supine, External rotation and internal rotation with shoulder adducted   PROM Assessment Site Shoulder   Right/Left Shoulder Right   Right Shoulder Flexion 152 Degrees  140 previous   Right Shoulder ABduction 158 Degrees  140 previous   Right Shoulder Internal Rotation 90 Degrees  same as previous   Right Shoulder External Rotation 79 Degrees  75 previous     Strength   Overall Strength Comments assessed standing. IR/er adducted. Strength not assessed previously.    Strength Assessment Site Shoulder   Right/Left Shoulder Right   Right Shoulder Extension 4/5  3-/5 previous   Right Shoulder ABduction 4/5  3-/5 previous   Right Shoulder Internal Rotation 4/5  3-/5 previous   Right Shoulder External Rotation 4-/5  3-/5 previous                  OT Treatments/Exercises (OP) - 02/12/17 0272      Exercises   Exercises Shoulder     Shoulder Exercises: Supine   Protraction PROM;5 reps   Horizontal ABduction PROM;5 reps   External Rotation PROM;5 reps   Internal Rotation PROM;5 reps   Flexion PROM;5 reps   ABduction PROM;5  reps     Shoulder Exercises: Prone   Other Prone Exercises hughston exercises, 10X each, 5 positions     Shoulder Exercises: Standing   Protraction AROM;15 reps   Horizontal ABduction AROM;15 reps   External Rotation AROM;15 reps   Internal Rotation AROM;15 reps   Flexion AROM;15 reps;Theraband;10 reps   Theraband Level (Shoulder Flexion) Level 2 (Red)   ABduction AROM;15 reps   Extension Theraband;15 reps   Theraband Level (Shoulder Extension) Level 3 (Green)   Row [employer] reps   Theraband Level (Shoulder Row) Level 3 (Green)   Retraction Theraband;15 reps   Theraband Level (Shoulder Retraction) Level 3 (Green)     Shoulder Exercises: ROM/Strengthening   X to V Arms 15X   Ball  on Wall 1' flexion 1' abduction     Manual Therapy   Manual Therapy Myofascial release   Manual therapy comments manual therapy completed seperately from all other interventions this date.     Myofascial Release myofascial release, manual stretching to right upper arm, scapular, shoulder, trapezius region to decrease pain and restrictions and improve pain free mobilty .                    OT Short Term Goals - 02/12/17 0836      OT SHORT TERM GOAL #1   Title Patient will be educated on a HEP for improved mobility and strength needed to return to prior level of function.    Time 4   Period Weeks   Status Achieved     OT SHORT TERM GOAL #2   Title Patient will imrpove right shoulder P/ROM to WNL for improved ability to complete upper extremity dressing.   Time 4   Period Weeks   Status Partially Met     OT SHORT TERM GOAL #3   Title Patient will improve right shoulder strength to 3+/5 for improved ability to reach to shoulder height.    Time 4   Period Weeks   Status Achieved     OT SHORT TERM GOAL #4   Title Patient will decrease fascial restrictions from min-mod to min for imrpoved mobility needed to completeion functional activities.    Time 4   Period Weeks   Status Achieved           OT Long Term Goals - 02/12/17 0836      OT LONG TERM GOAL #1   Title Patient will return to prior level of independence with all B/IADLs, work, leisure activities using right arm as dominant.    Time 8   Period Weeks   Status On-going     OT LONG TERM GOAL #2   Title Patient will improve right shoulder A/ROM to WNL in order to reach overhead into cabinets.    Time 8   Period Weeks   Status On-going     OT LONG TERM GOAL #3   Title Patient will improve right shoulder strength to 5/5 for improved ability to lift materials at work.    Time 8   Period Weeks   Status On-going     OT LONG TERM GOAL #4   Title Patient will improve right shoulder mobility by decreasing  right shoulder fascial restrictions to trace.    Time 8   Period Weeks   Status On-going               Plan - 02/12/17 3825    Clinical Impression Statement A: Reassessment completed this session, pt  has met 3/4 STGs and has partially met the remaining STG, no LTGs met thus far. Pt has made improvements in ROM and strength, continues to be limited with shoulder flexion and abduction. Pt will benefit from continued OT services focusing on improving end ROM and strengthening within his available ROM to improve ability to perform functional tasks such as overhead reaching with his dominant RUE. Pt is agreeable to continuing therapy for next 3 weeks until MD appointment.    Rehab Potential Good   OT Frequency 2x / week   OT Duration 4 weeks   OT Treatment/Interventions Self-care/ADL training;Cryotherapy;Electrical Stimulation;Moist Heat;Ultrasound;Iontophoresis;Therapeutic exercise;Neuromuscular education;Energy conservation;DME and/or AE instruction;Passive range of motion;Manual Therapy;Therapeutic exercises;Therapeutic activities;Patient/family education   Plan P: Continue working to improve end range of motion with flexion and abduction and strengthening within available ROM.    OT Home Exercise Plan 12/16/16:  table slides; 5/4: red scapular theraband   Consulted and Agree with Plan of Care Patient      Patient will benefit from skilled therapeutic intervention in order to improve the following deficits and impairments:  Decreased range of motion, Decreased strength, Increased fascial restricitons, Impaired UE functional use, Increased muscle spasms, Pain  Visit Diagnosis: Stiffness of right shoulder, not elsewhere classified - Plan: Ot plan of care cert/re-cert  Acute pain of right shoulder - Plan: Ot plan of care cert/re-cert  Other symptoms and signs involving the musculoskeletal system - Plan: Ot plan of care cert/re-cert    Problem List There are no active problems to  display for this patient.  [name], OTR/L  [phone] 02/12/2017, 9:09 AM  [place] [address] [place], [name], [address] Phone: [phone]   Fax:  [phone]  Name: Francisco Fox MRN: [id number] Date of Birth: 09-12-1966

## 2017-02-17 ENCOUNTER — Ambulatory Visit (HOSPITAL_COMMUNITY): Payer: BC Managed Care – PPO | Admitting: Specialist

## 2017-02-17 DIAGNOSIS — M25611 Stiffness of right shoulder, not elsewhere classified: Secondary | ICD-10-CM | POA: Diagnosis not present

## 2017-02-17 DIAGNOSIS — M25511 Pain in right shoulder: Secondary | ICD-10-CM

## 2017-02-17 DIAGNOSIS — R29898 Other symptoms and signs involving the musculoskeletal system: Secondary | ICD-10-CM

## 2017-02-17 NOTE — Therapy (Signed)
[place]isco [place], [name], [address] Phone: [phone]   Fax:  [phone]  Occupational Therapy Treatment  Patient Details  Name: Francisco Fox MRN: [id number] Date of Birth: 06/17/1966 Referring Provider: Dr. Arther Abbott  Encounter Date: 02/17/2017      OT End of Session - 02/17/17 0920    Visit Number 17   Number of Visits 21   Date for OT Re-Evaluation 03/07/17   Authorization Type BCBS State   OT Start Time [phone]   OT Stop Time 0945   OT Time Calculation (min) 40 min   Activity Tolerance Patient tolerated treatment well   Behavior During Therapy [facility] for tasks assessed/performed      Past Medical History:  Diagnosis Date  . Arthritis   . GERD (gastroesophageal reflux disease)     Past Surgical History:  Procedure Laterality Date  . CHOLECYSTECTOMY    . CYST EXCISION Right   . SHOULDER OPEN ROTATOR CUFF REPAIR Right 11/14/2016   Procedure: RIGHT OPEN ROTATOR CUFF REPAIR;  Surgeon: Carole Civil, MD;  Location: [facility];  Service: Orthopedics;  Laterality: Right;    There were no vitals filed for this visit.      Subjective Assessment - 02/17/17 0919    Subjective  S:  It just feels heavy when I get to a certain point reaching up.   Currently in Pain? No/denies   Pain Score 0-No pain            [facility] OT Assessment - 02/17/17 0001      Assessment   Diagnosis S/P RIght Rotator Cuff Repair     Precautions   Precautions Shoulder   Precaution Comments P/ROM 12/16/16-01/20/17, A/ROM 01/20/17- 03/03/17, PRE 03/03/17 on                  OT Treatments/Exercises (OP) - 02/17/17 0001      Exercises   Exercises Shoulder     Shoulder Exercises: Supine   Protraction PROM;5 reps;AROM;15 reps   Horizontal ABduction PROM;5 reps;AROM;15 reps   External Rotation PROM;5 reps;AROM;15 reps   Internal Rotation PROM;5 reps;AROM;15 reps   Flexion PROM;5 reps;AROM;15 reps   ABduction PROM;5  reps;AROM;15 reps     Shoulder Exercises: Standing   Protraction AROM;15 reps   Horizontal ABduction AROM;15 reps   External Rotation AROM;15 reps   Internal Rotation AROM;15 reps   Flexion AROM;15 reps;Theraband;10 reps   Theraband Level (Shoulder Flexion) Level 2 (Red)   ABduction AROM;15 reps   Extension Theraband;15 reps   Theraband Level (Shoulder Extension) Level 3 (Green)   Row Theraband;15 reps   Theraband Level (Shoulder Row) Level 3 (Green)   Retraction Theraband;15 reps   Theraband Level (Shoulder Retraction) Level 3 (Green)   Other Standing Exercises therapy ball flexion, chest press, overhead press, overhead v 10 times each to improve bilateral equality of movement      Shoulder Exercises: ROM/Strengthening   "W" Arms 15X   X to V Arms 15X   Proximal Shoulder Strengthening, Supine 10 X no rest   Proximal Shoulder Strengthening, Seated 10 X no rest   Ball on Wall 1' flexion 1' abduction     Manual Therapy   Manual Therapy Myofascial release   Manual therapy comments manual therapy completed seperately from all other interventions this date.     Myofascial Release myofascial release, manual stretching to right upper arm, scapular, shoulder, trapezius region to decrease pain and restrictions and improve pain  free mobilty .                    OT Short Term Goals - 02/12/17 0836      OT SHORT TERM GOAL #1   Title Patient will be educated on a HEP for improved mobility and strength needed to return to prior level of function.    Time 4   Period Weeks   Status Achieved     OT SHORT TERM GOAL #2   Title Patient will imrpove right shoulder P/ROM to WNL for improved ability to complete upper extremity dressing.   Time 4   Period Weeks   Status Partially Met     OT SHORT TERM GOAL #3   Title Patient will improve right shoulder strength to 3+/5 for improved ability to reach to shoulder height.    Time 4   Period Weeks   Status Achieved     OT SHORT TERM  GOAL #4   Title Patient will decrease fascial restrictions from min-mod to min for imrpoved mobility needed to completeion functional activities.    Time 4   Period Weeks   Status Achieved           OT Long Term Goals - 02/12/17 0836      OT LONG TERM GOAL #1   Title Patient will return to prior level of independence with all B/IADLs, work, leisure activities using right arm as dominant.    Time 8   Period Weeks   Status On-going     OT LONG TERM GOAL #2   Title Patient will improve right shoulder A/ROM to WNL in order to reach overhead into cabinets.    Time 8   Period Weeks   Status On-going     OT LONG TERM GOAL #3   Title Patient will improve right shoulder strength to 5/5 for improved ability to lift materials at work.    Time 8   Period Weeks   Status On-going     OT LONG TERM GOAL #4   Title Patient will improve right shoulder mobility by decreasing right shoulder fascial restrictions to trace.    Time 8   Period Weeks   Status On-going               Plan - 02/17/17 1308    Clinical Impression Statement A:  End range flexion and abduction most difficult to obtain.  added large therapy ball exercises for improved bilateral symmetry of movement.    Plan P:  complete flexion and abduction exercises from 90 to full range to improve full range available movement.       Patient will benefit from skilled therapeutic intervention in order to improve the following deficits and impairments:     Visit Diagnosis: Stiffness of right shoulder, not elsewhere classified  Acute pain of right shoulder  Other symptoms and signs involving the musculoskeletal system    Problem List There are no active problems to display for this patient.   [name], [place], OTR/L [phone]  02/17/2017, 9:45 AM  [place], [name], [address] Phone: [phone]   Fax:  [phone]  Name: Francisco Fox MRN: [id number] Date of Birth: 08-26-1966

## 2017-02-19 ENCOUNTER — Ambulatory Visit (HOSPITAL_COMMUNITY): Payer: BC Managed Care – PPO | Admitting: Specialist

## 2017-02-19 DIAGNOSIS — M25611 Stiffness of right shoulder, not elsewhere classified: Secondary | ICD-10-CM | POA: Diagnosis not present

## 2017-02-19 DIAGNOSIS — R29898 Other symptoms and signs involving the musculoskeletal system: Secondary | ICD-10-CM

## 2017-02-19 DIAGNOSIS — M25511 Pain in right shoulder: Secondary | ICD-10-CM

## 2017-02-19 NOTE — Therapy (Signed)
[place], [name], [address] Phone: [phone]   Fax:  [phone]  Occupational Therapy Treatment  Patient Details  Name: Francisco Fox MRN: [id number] Date of Birth: [birth year] Referring Provider: Dr. Arther Abbott  Encounter Date: 02/19/2017      OT End of Session - 02/19/17 1242    Visit Number 18   Number of Visits 21   Date for OT Re-Evaluation 03/07/17   Authorization Type BCBS State   OT Start Time [phone]   OT Stop Time 1031   OT Time Calculation (min) 45 min   Activity Tolerance Patient tolerated treatment well   Behavior During Therapy [facility] for tasks assessed/performed      Past Medical History:  Diagnosis Date  . Arthritis   . GERD (gastroesophageal reflux disease)     Past Surgical History:  Procedure Laterality Date  . CHOLECYSTECTOMY    . CYST EXCISION Right   . SHOULDER OPEN ROTATOR CUFF REPAIR Right 11/14/2016   Procedure: RIGHT OPEN ROTATOR CUFF REPAIR;  Surgeon: Carole Civil, MD;  Location: [facility];  Service: Orthopedics;  Laterality: Right;    There were no vitals filed for this visit.      Subjective Assessment - 02/19/17 1242    Subjective  S:  It seems looser today.  It is still hard to reach to the end range. It feels tight and sore when I try to do that   Currently in Pain? Yes   Pain Score 2    Pain Location Shoulder   Pain Orientation Right   Pain Descriptors / Indicators Sore   Pain Type Acute pain            [facility] OT Assessment - 02/19/17 0001      Assessment   Diagnosis S/P RIght Rotator Cuff Repair     Precautions   Precautions Shoulder   Precaution Comments P/ROM 12/16/16-01/20/17, A/ROM 01/20/17- 03/03/17, PRE 03/03/17 on                  OT Treatments/Exercises (OP) - 02/19/17 0001      Exercises   Exercises Shoulder     Shoulder Exercises: Supine   Protraction PROM;5 reps   Horizontal ABduction PROM;5 reps   External Rotation  PROM;5 reps   Internal Rotation PROM;5 reps   Flexion PROM;5 reps   ABduction PROM;5 reps     Shoulder Exercises: Standing   Protraction AROM;15 reps   Horizontal ABduction AROM;15 reps   External Rotation AROM;15 reps   Internal Rotation AROM;15 reps   Flexion AROM;15 reps   ABduction AROM;15 reps   Extension Theraband;15 reps   Theraband Level (Shoulder Extension) Level 3 (Green)   Row [employer] reps   Theraband Level (Shoulder Row) Level 3 (Green)   Retraction Theraband;15 reps   Theraband Level (Shoulder Retraction) Level 3 (Green)     Shoulder Exercises: ROM/Strengthening   UBE (Upper Arm Bike) level 2 3' forward and 3' reverse    Wall Wash wrote alpahbet on wall with arm flexed above 90, wrote name with arm abducted to greater than 90 - max difficulty   Proximal Shoulder Strengthening, Supine 10 X no rest   Proximal Shoulder Strengthening, Seated 10 X no rest   Other ROM/Strengthening Exercises PVC pipe slide from 90 flexion to end range and 90 abduction to end range 15 times each   Other ROM/Strengthening Exercises pinch tree from 90 degrees forward flexion to 8" from top  of vertical pole with max difficulty and verbal guidance to avoid leaning      Manual Therapy   Manual Therapy Myofascial release   Manual therapy comments manual therapy completed seperately from all other interventions this date.     Myofascial Release myofascial release, manual stretching to right upper arm, scapular, shoulder, trapezius region to decrease pain and restrictions and improve pain free mobilty .                    OT Short Term Goals - 02/12/17 0836      OT SHORT TERM GOAL #1   Title Patient will be educated on a HEP for improved mobility and strength needed to return to prior level of function.    Time 4   Period Weeks   Status Achieved     OT SHORT TERM GOAL #2   Title Patient will imrpove right shoulder P/ROM to WNL for improved ability to complete upper extremity  dressing.   Time 4   Period Weeks   Status Partially Met     OT SHORT TERM GOAL #3   Title Patient will improve right shoulder strength to 3+/5 for improved ability to reach to shoulder height.    Time 4   Period Weeks   Status Achieved     OT SHORT TERM GOAL #4   Title Patient will decrease fascial restrictions from min-mod to min for imrpoved mobility needed to completeion functional activities.    Time 4   Period Weeks   Status Achieved           OT Long Term Goals - 02/12/17 0836      OT LONG TERM GOAL #1   Title Patient will return to prior level of independence with all B/IADLs, work, leisure activities using right arm as dominant.    Time 8   Period Weeks   Status On-going     OT LONG TERM GOAL #2   Title Patient will improve right shoulder A/ROM to WNL in order to reach overhead into cabinets.    Time 8   Period Weeks   Status On-going     OT LONG TERM GOAL #3   Title Patient will improve right shoulder strength to 5/5 for improved ability to lift materials at work.    Time 8   Period Weeks   Status On-going     OT LONG TERM GOAL #4   Title Patient will improve right shoulder mobility by decreasing right shoulder fascial restrictions to trace.    Time 8   Period Weeks   Status On-going               Plan - 02/19/17 1243    Clinical Impression Statement A:  focused on flexion and abduction from 90- end range this date.  Patient fatigues quickly with abduction, although range is better than flexion    Plan P:  Continue to focus on achieving end range flexion and abduction.  Complete alphabet and pinch tree with less fatigue.       Patient will benefit from skilled therapeutic intervention in order to improve the following deficits and impairments:  Decreased range of motion, Decreased strength, Increased fascial restricitons, Impaired UE functional use, Increased muscle spasms, Pain  Visit Diagnosis: Stiffness of right shoulder, not elsewhere  classified  Acute pain of right shoulder  Other symptoms and signs involving the musculoskeletal system    Problem List There are no active problems to display  for this patient.   [name], [place], OTR/L [phone]  02/19/2017, 12:49 PM  [place] [address] [place], [name], [address] Phone: [phone]   Fax:  [phone]  Name: Francisco Fox MRN: [id number] Date of Birth: Jun 24, 1966

## 2017-02-24 ENCOUNTER — Ambulatory Visit (HOSPITAL_COMMUNITY): Payer: BC Managed Care – PPO | Admitting: Specialist

## 2017-02-24 ENCOUNTER — Encounter (HOSPITAL_COMMUNITY): Payer: Self-pay | Admitting: Specialist

## 2017-02-24 DIAGNOSIS — M25511 Pain in right shoulder: Secondary | ICD-10-CM

## 2017-02-24 DIAGNOSIS — R29898 Other symptoms and signs involving the musculoskeletal system: Secondary | ICD-10-CM

## 2017-02-24 DIAGNOSIS — M25611 Stiffness of right shoulder, not elsewhere classified: Secondary | ICD-10-CM | POA: Diagnosis not present

## 2017-02-24 NOTE — Therapy (Signed)
[place], [name], [address] Phone: [phone]   Fax:  [phone]  Occupational Therapy Treatment  Patient Details  Name: Francisco Fox MRN: [id number] Date of Birth: 04/01/1966 Referring Provider: Dr. Arther Abbott  Encounter Date: 02/24/2017      OT End of Session - 02/24/17 1205    Visit Number 19   Number of Visits 21   Date for OT Re-Evaluation 03/07/17   Authorization Type BCBS State   OT Start Time 1124   OT Stop Time 1206   OT Time Calculation (min) 42 min   Activity Tolerance Patient tolerated treatment well   Behavior During Therapy [facility] for tasks assessed/performed      Past Medical History:  Diagnosis Date  . Arthritis   . GERD (gastroesophageal reflux disease)     Past Surgical History:  Procedure Laterality Date  . CHOLECYSTECTOMY    . CYST EXCISION Right   . SHOULDER OPEN ROTATOR CUFF REPAIR Right 11/14/2016   Procedure: RIGHT OPEN ROTATOR CUFF REPAIR;  Surgeon: Carole Civil, MD;  Location: [facility];  Service: Orthopedics;  Laterality: Right;    There were no vitals filed for this visit.      Subjective Assessment - 02/24/17 1204    Subjective  S:  It wasnt too sore after last time   Currently in Pain? No/denies            [facility] OT Assessment - 02/24/17 0001      Assessment   Diagnosis S/P RIght Rotator Cuff Repair     Precautions   Precautions Shoulder   Precaution Comments P/ROM 12/16/16-01/20/17, A/ROM 01/20/17- 03/03/17, PRE 03/03/17 on                  OT Treatments/Exercises (OP) - 02/24/17 0001      Exercises   Exercises Shoulder     Shoulder Exercises: Supine   Protraction PROM;5 reps;AROM;15 reps   Horizontal ABduction PROM;5 reps;AROM;15 reps   External Rotation PROM;5 reps;AROM;15 reps   Internal Rotation PROM;5 reps;AROM;15 reps   Flexion PROM;5 reps;AROM;15 reps   ABduction PROM;5 reps;AROM;15 reps     Shoulder Exercises: Standing    Protraction AROM;15 reps   Horizontal ABduction AROM;15 reps   External Rotation AROM;15 reps   Internal Rotation AROM;15 reps   Flexion AROM;15 reps   ABduction AROM;15 reps   Extension Theraband;15 reps   Theraband Level (Shoulder Extension) Level 3 (Green)   Row [employer] reps   Theraband Level (Shoulder Row) Level 3 (Green)   Retraction Theraband;15 reps   Theraband Level (Shoulder Retraction) Level 3 (Green)   Other Standing Exercises therapy ball flexion, chest press, overhead press, overhead v 15 times each to improve bilateral equality of movement      Shoulder Exercises: ROM/Strengthening   UBE (Upper Arm Bike) level 2 3' forward and 3' reverse    Wall Wash wrote alpahbet on wall with arm flexed above 90, wrote name with arm abducted to greater than 90 - min-mod difficulty   Proximal Shoulder Strengthening, Seated 10 X no rest   Other ROM/Strengthening Exercises PVC pipe slide from 90 flexion to end range and 90 abduction to end range 15 times each   Other ROM/Strengthening Exercises pinch tree from 90 degrees forward flexion to 3" from top of vertical pole with min-mod difficulty      Manual Therapy   Manual Therapy Myofascial release   Manual therapy comments manual therapy completed seperately  from all other interventions this date.     Myofascial Release myofascial release, manual stretching to right upper arm, scapular, shoulder, trapezius region to decrease pain and restrictions and improve pain free mobilty .                    OT Short Term Goals - 02/12/17 0836      OT SHORT TERM GOAL #1   Title Patient will be educated on a HEP for improved mobility and strength needed to return to prior level of function.    Time 4   Period Weeks   Status Achieved     OT SHORT TERM GOAL #2   Title Patient will imrpove right shoulder P/ROM to WNL for improved ability to complete upper extremity dressing.   Time 4   Period Weeks   Status Partially Met     OT  SHORT TERM GOAL #3   Title Patient will improve right shoulder strength to 3+/5 for improved ability to reach to shoulder height.    Time 4   Period Weeks   Status Achieved     OT SHORT TERM GOAL #4   Title Patient will decrease fascial restrictions from min-mod to min for imrpoved mobility needed to completeion functional activities.    Time 4   Period Weeks   Status Achieved           OT Long Term Goals - 02/12/17 0836      OT LONG TERM GOAL #1   Title Patient will return to prior level of independence with all B/IADLs, work, leisure activities using right arm as dominant.    Time 8   Period Weeks   Status On-going     OT LONG TERM GOAL #2   Title Patient will improve right shoulder A/ROM to WNL in order to reach overhead into cabinets.    Time 8   Period Weeks   Status On-going     OT LONG TERM GOAL #3   Title Patient will improve right shoulder strength to 5/5 for improved ability to lift materials at work.    Time 8   Period Weeks   Status On-going     OT LONG TERM GOAL #4   Title Patient will improve right shoulder mobility by decreasing right shoulder fascial restrictions to trace.    Time 8   Period Weeks   Status On-going               Plan - 02/24/17 1205    Clinical Impression Statement A:  Patient able to complete alphabet and pinch tree with significant less difficulty and improved range this date.    Plan P:  Improve flexion from 150-180 with less pain and restrictions in anterior shoulder.       Patient will benefit from skilled therapeutic intervention in order to improve the following deficits and impairments:     Visit Diagnosis: Stiffness of right shoulder, not elsewhere classified  Acute pain of right shoulder  Other symptoms and signs involving the musculoskeletal system    Problem List There are no active problems to display for this patient.   [name], [place], OTR/L [phone]  02/24/2017, 12:07 PM  [place] [address] [place], [name], [address] Phone: [phone]   Fax:  [phone]  Name: Francisco Fox MRN: [id number] Date of Birth: 1966/01/17

## 2017-02-26 ENCOUNTER — Ambulatory Visit (HOSPITAL_COMMUNITY): Payer: BC Managed Care – PPO

## 2017-02-26 ENCOUNTER — Encounter (HOSPITAL_COMMUNITY): Payer: Self-pay

## 2017-02-26 DIAGNOSIS — M25611 Stiffness of right shoulder, not elsewhere classified: Secondary | ICD-10-CM

## 2017-02-26 DIAGNOSIS — M25511 Pain in right shoulder: Secondary | ICD-10-CM

## 2017-02-26 DIAGNOSIS — R29898 Other symptoms and signs involving the musculoskeletal system: Secondary | ICD-10-CM

## 2017-02-26 NOTE — Therapy (Signed)
[place], [name], [address] Phone: [phone]   Fax:  [phone]  Occupational Therapy Treatment  Patient Details  Name: Francisco Fox MRN: [id number] Date of Birth: 27-Apr-1966 Referring Provider: Dr. Arther Abbott  Encounter Date: 02/26/2017      OT End of Session - 02/26/17 1252    Visit Number 20   Number of Visits 21   Date for OT Re-Evaluation 03/07/17   Authorization Type BCBS State   OT Start Time [phone]  previous patient ran over time.   OT Stop Time 1031   OT Time Calculation (min) 33 min   Activity Tolerance Patient tolerated treatment well   Behavior During Therapy [facility] for tasks assessed/performed      Past Medical History:  Diagnosis Date  . Arthritis   . GERD (gastroesophageal reflux disease)     Past Surgical History:  Procedure Laterality Date  . CHOLECYSTECTOMY    . CYST EXCISION Right   . SHOULDER OPEN ROTATOR CUFF REPAIR Right 11/14/2016   Procedure: RIGHT OPEN ROTATOR CUFF REPAIR;  Surgeon: Carole Civil, MD;  Location: [facility];  Service: Orthopedics;  Laterality: Right;    There were no vitals filed for this visit.      Subjective Assessment - 02/26/17 1250    Subjective  S: It's a little sore during the stretching.   Currently in Pain? No/denies            [facility] OT Assessment - 02/26/17 1250      Assessment   Diagnosis S/P RIght Rotator Cuff Repair     Precautions   Precautions Shoulder   Precaution Comments P/ROM 12/16/16-01/20/17, A/ROM 01/20/17- 03/03/17, PRE 03/03/17 on                  OT Treatments/Exercises (OP) - 02/26/17 1251      Exercises   Exercises Shoulder     Shoulder Exercises: Supine   Protraction PROM;5 reps;AROM;15 reps   Horizontal ABduction PROM;5 reps;AROM;15 reps   External Rotation PROM;5 reps;AROM;15 reps   Internal Rotation PROM;5 reps;AROM;15 reps   Flexion PROM;5 reps;AROM;15 reps   ABduction PROM;5 reps;AROM;15  reps     Shoulder Exercises: Standing   Protraction AROM;15 reps   Horizontal ABduction AROM;15 reps   External Rotation AROM;15 reps   Internal Rotation AROM;15 reps   Flexion Theraband;15 reps   Theraband Level (Shoulder Flexion) Level 2 (Red)   ABduction AROM;15 reps   Extension Theraband;15 reps   Theraband Level (Shoulder Extension) Level 3 (Green)   Row [employer] reps   Theraband Level (Shoulder Row) Level 3 (Green)   Retraction Theraband;15 reps   Theraband Level (Shoulder Retraction) Level 3 (Green)     Manual Therapy   Manual Therapy Myofascial release;Muscle Energy Technique   Manual therapy comments manual therapy completed seperately from all other interventions this date.     Myofascial Release myofascial release, manual stretching to right upper arm, scapular, shoulder, trapezius region to decrease pain and restrictions and improve pain free mobilty .     Muscle Energy Technique Muscle energy technique used with right anterior deltoid to relax tone and muscle spasm and improve range of motion.                 OT Education - 02/26/17 1252    Education provided Yes   Education Details Informed patient that on 5/28, he may not begin using a 1# handwieght, or water bottle or can  of soup to begin strengthening.    Person(s) Educated Patient   Methods Explanation   Comprehension Verbalized understanding          OT Short Term Goals - 02/12/17 0836      OT SHORT TERM GOAL #1   Title Patient will be educated on a HEP for improved mobility and strength needed to return to prior level of function.    Time 4   Period Weeks   Status Achieved     OT SHORT TERM GOAL #2   Title Patient will imrpove right shoulder P/ROM to WNL for improved ability to complete upper extremity dressing.   Time 4   Period Weeks   Status Partially Met     OT SHORT TERM GOAL #3   Title Patient will improve right shoulder strength to 3+/5 for improved ability to reach to shoulder  height.    Time 4   Period Weeks   Status Achieved     OT SHORT TERM GOAL #4   Title Patient will decrease fascial restrictions from min-mod to min for imrpoved mobility needed to completeion functional activities.    Time 4   Period Weeks   Status Achieved           OT Long Term Goals - 02/12/17 0836      OT LONG TERM GOAL #1   Title Patient will return to prior level of independence with all B/IADLs, work, leisure activities using right arm as dominant.    Time 8   Period Weeks   Status On-going     OT LONG TERM GOAL #2   Title Patient will improve right shoulder A/ROM to WNL in order to reach overhead into cabinets.    Time 8   Period Weeks   Status On-going     OT LONG TERM GOAL #3   Title Patient will improve right shoulder strength to 5/5 for improved ability to lift materials at work.    Time 8   Period Weeks   Status On-going     OT LONG TERM GOAL #4   Title Patient will improve right shoulder mobility by decreasing right shoulder fascial restrictions to trace.    Time 8   Period Weeks   Status On-going               Plan - 02/26/17 1253    Clinical Impression Statement A: Complete muscle energy technique this session during shoulder flexion to increase ROM during passive stretching. Patient did gain a small amount more although continues to have limited range.    Plan Reassessment and determine if more therapy is warranted.       Patient will benefit from skilled therapeutic intervention in order to improve the following deficits and impairments:  Decreased range of motion, Decreased strength, Increased fascial restricitons, Impaired UE functional use, Increased muscle spasms, Pain  Visit Diagnosis: Stiffness of right shoulder, not elsewhere classified  Acute pain of right shoulder  Other symptoms and signs involving the musculoskeletal system    Problem List There are no active problems to display for this patient.  Ailene Ravel,  OTR/L,CBIS  [phone]  02/26/2017, 12:58 PM  [place] [address] [place], [name], [address] Phone: [phone]   Fax:  [phone]  Name: KOEHN SALEHI MRN: [id number] Date of Birth: 15-Dec-1965

## 2017-03-05 ENCOUNTER — Encounter (HOSPITAL_COMMUNITY): Payer: Self-pay

## 2017-03-06 ENCOUNTER — Ambulatory Visit (HOSPITAL_COMMUNITY): Payer: BC Managed Care – PPO

## 2017-03-06 DIAGNOSIS — M25611 Stiffness of right shoulder, not elsewhere classified: Secondary | ICD-10-CM

## 2017-03-06 DIAGNOSIS — M25511 Pain in right shoulder: Secondary | ICD-10-CM

## 2017-03-06 DIAGNOSIS — R29898 Other symptoms and signs involving the musculoskeletal system: Secondary | ICD-10-CM

## 2017-03-06 NOTE — Patient Instructions (Signed)
  Wall Flexion  slide your arm up the wall until a stretch is felt in your shoulder . Hold for 15-30 seconds. Complete 2 times.     Shoulder Abduction Stretch  Stand side ways by a wall with affected up on wall. Gently lean toward wall to feel stretch.  Hold for 15-30 seconds. Complete 2 times.     Complete each band exercises 10-15 times. 1-2 times a day.  Strengthening: Chest Pull - Resisted   Hold Theraband in front of body with hands about shoulder width a part. Pull band a part and back together slowly. Repeat ____ times. Complete ____ set(s) per session.. Repeat ____ session(s) per day.  http://orth.exer.us/926   Copyright  VHI. All rights reserved.   PNF Strengthening: Resisted   Standing with resistive band around each hand, bring right arm up and away, thumb back. Repeat ____ times per set. Do ____ sets per session. Do ____ sessions per day.     Resisted External Rotation: in Neutral - Bilateral   Sit or stand, tubing in both hands, elbows at sides, bent to 90, forearms forward. Pinch shoulder blades together and rotate forearms out. Keep elbows at sides. Repeat ____ times per set. Do ____ sets per session. Do ____ sessions per day.  http://orth.exer.us/966   Copyright  VHI. All rights reserved.   PNF Strengthening: Resisted   Standing, hold resistive band above head. Bring right arm down and out from side. Repeat ____ times per set. Do ____ sets per session. Do ____ sessions per day.  http://orth.exer.us/922   Copyright  VHI. All rights reserved.

## 2017-03-06 NOTE — Therapy (Signed)
[place], [name], [address] Phone: [phone]   Fax:  [phone]  Occupational Therapy Treatment And reassessment Patient Details  Name: Francisco Fox MRN: [id number] Date of Birth: 1966-01-29 Referring Provider: Dr. Arther Abbott  Encounter Date: 03/06/2017      OT End of Session - 03/06/17 0951    Visit Number 21   Number of Visits 21   Authorization Type [employer] State   OT Start Time 0820   OT Stop Time 0900   OT Time Calculation (min) 40 min   Activity Tolerance Patient tolerated treatment well   Behavior During Therapy [facility] for tasks assessed/performed      Past Medical History:  Diagnosis Date  . Arthritis   . GERD (gastroesophageal reflux disease)     Past Surgical History:  Procedure Laterality Date  . CHOLECYSTECTOMY    . CYST EXCISION Right   . SHOULDER OPEN ROTATOR CUFF REPAIR Right 11/14/2016   Procedure: RIGHT OPEN ROTATOR CUFF REPAIR;  Surgeon: Carole Civil, MD;  Location: [facility];  Service: Orthopedics;  Laterality: Right;    There were no vitals filed for this visit.      Subjective Assessment - 03/06/17 0850    Subjective  S: I'm a little sore.   Currently in Pain? Yes   Pain Score 3    Pain Location Shoulder   Pain Orientation Right   Pain Descriptors / Indicators Sore   Pain Type Acute pain   Pain Radiating Towards N/A   Pain Onset In the past 7 days   Pain Frequency Occasional   Aggravating Factors  use   Pain Relieving Factors rest   Effect of Pain on Daily Activities min effect   Multiple Pain Sites No            [facility] OT Assessment - 03/06/17 0822      Assessment   Diagnosis S/P RIght Rotator Cuff Repair     Precautions   Precautions Shoulder   Precaution Comments P/ROM 12/16/16-01/20/17, A/ROM 01/20/17- 03/03/17, PRE 03/03/17 on     AROM   Overall AROM Comments Assessed standing. IR/er adducted.    AROM Assessment Site Shoulder   Right/Left  Shoulder Right   Right Shoulder Flexion 155 Degrees  previous: 140   Right Shoulder ABduction 145 Degrees  previous: 139   Right Shoulder Internal Rotation 90 Degrees  previous: same   Right Shoulder External Rotation 80 Degrees  previous: 78     PROM   Overall PROM Comments assessed in supine, External rotation and internal rotation with shoulder adducted   PROM Assessment Site Shoulder   Right/Left Shoulder Right   Right Shoulder Flexion 155 Degrees  previous: 155   Right Shoulder ABduction 180 Degrees  previoius: 158   Right Shoulder Internal Rotation 90 Degrees  previous: same   Right Shoulder External Rotation 90 Degrees  previous: 79     Strength   Overall Strength Comments assessed standing. IR/er adducted.    Strength Assessment Site Shoulder   Right/Left Shoulder Right   Right Shoulder Flexion 5/5  previous: 3-/5   Right Shoulder ABduction 5/5  preivous: 4/5   Right Shoulder Internal Rotation 5/5  previous: 4/5   Right Shoulder External Rotation 4+/5  previous: 4-/5                  OT Treatments/Exercises (OP) - 03/06/17 0841      Exercises   Exercises Shoulder  Shoulder Exercises: Supine   Protraction PROM;5 reps   Horizontal ABduction PROM;5 reps   External Rotation PROM;5 reps   Internal Rotation PROM;5 reps   Flexion PROM;5 reps   ABduction PROM;5 reps     Shoulder Exercises: Standing   Horizontal ABduction Theraband;10 reps   Theraband Level (Shoulder Horizontal ABduction) Level 2 (Red)   External Rotation Theraband;10 reps   Theraband Level (Shoulder External Rotation) Level 2 (Red)   Internal Rotation Theraband;10 reps   Theraband Level (Shoulder Internal Rotation) Level 2 (Red)   ABduction Theraband;10 reps   Theraband Level (Shoulder ABduction) Level 2 (Red)   Other Standing Exercises Adduction; red theraband; 10X     Shoulder Exercises: Stretch   Wall Stretch - Flexion 2 reps  15 seconds   Wall Stretch - ABduction 2  reps  15 seconds.      Manual Therapy   Manual Therapy Myofascial release;Muscle Energy Technique   Manual therapy comments manual therapy completed seperately from all other interventions this date.     Myofascial Release myofascial release, manual stretching to right upper arm, scapular, shoulder, trapezius region to decrease pain and restrictions and improve pain free mobilty .     Muscle Energy Technique Muscle energy technique used with right anterior deltoid to relax tone and muscle spasm and improve range of motion.                   OT Short Term Goals - 03/06/17 0855      OT SHORT TERM GOAL #1   Title Patient will be educated on a HEP for improved mobility and strength needed to return to prior level of function.    Time 4   Period Weeks   Status Achieved     OT SHORT TERM GOAL #2   Title Patient will imrpove right shoulder P/ROM to WNL for improved ability to complete upper extremity dressing.   Time 4   Period Weeks   Status Achieved     OT SHORT TERM GOAL #3   Title Patient will improve right shoulder strength to 3+/5 for improved ability to reach to shoulder height.    Time 4   Period Weeks   Status Achieved     OT SHORT TERM GOAL #4   Title Patient will decrease fascial restrictions from min-mod to min for imrpoved mobility needed to completeion functional activities.    Time 4   Period Weeks   Status Achieved           OT Long Term Goals - 03/06/17 0855      OT LONG TERM GOAL #1   Title Patient will return to prior level of independence with all B/IADLs, work, leisure activities using right arm as dominant.    Time 8   Period Weeks   Status Achieved     OT LONG TERM GOAL #2   Title Patient will improve right shoulder A/ROM to WNL in order to reach overhead into cabinets.    Time 8   Period Weeks   Status Achieved     OT LONG TERM GOAL #3   Title Patient will improve right shoulder strength to 5/5 for improved ability to lift materials  at work.    Time 8   Period Weeks   Status Partially Met     OT LONG TERM GOAL #4   Title Patient will improve right shoulder mobility by decreasing right shoulder fascial restrictions to trace.    Time 8  Period Weeks   Status Achieved               Plan - 03/06/17 1002    Clinical Impression Statement A: Reassessment completed this date. Patient has met all therapy goals.  HEP has been updated. Patient reports that he feels he still has some difficulty with reaching above shoulder level. Pt agrees with discharge  recommendation.    Plan P: D/C with HEP.      Patient will benefit from skilled therapeutic intervention in order to improve the following deficits and impairments:  Decreased range of motion, Decreased strength, Increased fascial restricitons, Impaired UE functional use, Increased muscle spasms, Pain  Visit Diagnosis: Stiffness of right shoulder, not elsewhere classified  Acute pain of right shoulder  Other symptoms and signs involving the musculoskeletal system    Problem List There are no active problems to display for this patient.  OCCUPATIONAL THERAPY DISCHARGE SUMMARY  Visits from Start of Care: 21  Current functional level related to goals / functional outcomes: See above   Remaining deficits: See above   Education / Equipment: Red theraband shoulder strengthening and shoulder flexion and abduction stretches Plan: Patient agrees to discharge.  Patient goals were met. Patient is being discharged due to meeting the stated rehab goals.  ?????       Ailene Ravel, OTR/L,CBIS  [phone]  03/06/2017, 10:14 AM  [facility] [address] [place], [name], [address] Phone: [phone]   Fax:  [phone]  Name: Francisco Fox MRN: [id number] Date of Birth: [birth year]

## 2017-03-07 ENCOUNTER — Encounter: Payer: Self-pay | Admitting: Orthopedic Surgery

## 2017-03-07 ENCOUNTER — Ambulatory Visit (INDEPENDENT_AMBULATORY_CARE_PROVIDER_SITE_OTHER): Payer: BC Managed Care – PPO | Admitting: Orthopedic Surgery

## 2017-03-07 DIAGNOSIS — Z9889 Other specified postprocedural states: Secondary | ICD-10-CM | POA: Diagnosis not present

## 2017-03-07 DIAGNOSIS — Z4889 Encounter for other specified surgical aftercare: Secondary | ICD-10-CM | POA: Diagnosis not present

## 2017-03-07 MED ORDER — HYDROCODONE-ACETAMINOPHEN 7.5-325 MG PO TABS: 1.0000 | ORAL_TABLET | Freq: Three times a day (TID) | ORAL | 0 refills | Status: DC | PRN

## 2017-03-07 NOTE — Patient Instructions (Signed)
CONTINUE THERAPY   FU IN 8 WEEKS

## 2017-03-07 NOTE — Progress Notes (Signed)
Follow-up visit status post rotator cuff repair right shoulder  Chief Complaint  Patient presents with  . Follow-up    Right RCR, DOS 11/14/16    The patient had a large massive cuff tear which was repaired  His current complaints are mild pain and discomfort especially with physical therapy  Review of systems he has no other musculoskeletal complaints and his nerve review of systems is normal  His range of motion is 35 with his arm at his side, 110 of active range of motion 150 of passive range of motion flexion, abduction passive and active 90  Recommend continue physical therapy I refilled his medication  Follow-up 8 weeks   Current Outpatient Prescriptions:  .  cyclobenzaprine (FLEXERIL) 10 MG tablet, Take 1 tablet (10 mg total) by mouth 3 (three) times daily as needed., Disp: 60 tablet, Rfl: 1 .  HYDROcodone-acetaminophen (NORCO) 7.5-325 MG tablet, Take 1 tablet by mouth every 8 (eight) hours as needed for moderate pain., Disp: 60 tablet, Rfl: 0 .  naproxen (NAPROSYN) 500 MG tablet, Take 500 mg by mouth 2 (two) times daily with a meal., Disp: , Rfl:  .  omeprazole (PRILOSEC) 40 MG capsule, Take 40 mg by mouth daily., Disp: , Rfl:

## 2017-03-09 ENCOUNTER — Telehealth: Payer: Self-pay | Admitting: Orthopaedic Surgery

## 2017-03-19 ENCOUNTER — Other Ambulatory Visit: Payer: Self-pay | Admitting: Orthopedic Surgery

## 2017-03-19 DIAGNOSIS — Z4889 Encounter for other specified surgical aftercare: Secondary | ICD-10-CM

## 2017-03-19 DIAGNOSIS — Z9889 Other specified postprocedural states: Secondary | ICD-10-CM

## 2017-03-21 ENCOUNTER — Other Ambulatory Visit: Payer: Self-pay | Admitting: Orthopedic Surgery

## 2017-03-21 DIAGNOSIS — Z9889 Other specified postprocedural states: Secondary | ICD-10-CM

## 2017-03-21 DIAGNOSIS — Z4889 Encounter for other specified surgical aftercare: Secondary | ICD-10-CM

## 2017-03-21 MED ORDER — HYDROCODONE-ACETAMINOPHEN 7.5-325 MG PO TABS: 1.0000 | ORAL_TABLET | Freq: Three times a day (TID) | ORAL | 0 refills | Status: DC | PRN

## 2017-04-04 ENCOUNTER — Other Ambulatory Visit (HOSPITAL_COMMUNITY): Payer: Self-pay | Admitting: Orthopedic Surgery

## 2017-04-04 DIAGNOSIS — Z4889 Encounter for other specified surgical aftercare: Secondary | ICD-10-CM

## 2017-04-04 DIAGNOSIS — Z9889 Other specified postprocedural states: Secondary | ICD-10-CM

## 2017-04-04 MED ORDER — HYDROCODONE-ACETAMINOPHEN 7.5-325 MG PO TABS: 1.0000 | ORAL_TABLET | Freq: Three times a day (TID) | ORAL | 0 refills | Status: DC | PRN

## 2017-05-02 ENCOUNTER — Telehealth: Payer: Self-pay | Admitting: Orthopedic Surgery

## 2017-05-02 ENCOUNTER — Ambulatory Visit (INDEPENDENT_AMBULATORY_CARE_PROVIDER_SITE_OTHER): Payer: BC Managed Care – PPO | Admitting: Orthopedic Surgery

## 2017-05-02 ENCOUNTER — Encounter: Payer: Self-pay | Admitting: Orthopedic Surgery

## 2017-05-02 DIAGNOSIS — Z9889 Other specified postprocedural states: Secondary | ICD-10-CM

## 2017-05-02 DIAGNOSIS — Z4889 Encounter for other specified surgical aftercare: Secondary | ICD-10-CM

## 2017-05-02 MED ORDER — HYDROCODONE-ACETAMINOPHEN 5-325 MG PO TABS: 1.0000 | ORAL_TABLET | Freq: Three times a day (TID) | ORAL | 0 refills | Status: DC | PRN

## 2017-05-02 NOTE — Patient Instructions (Addendum)
Use as little opioid (hydrocodone) as possible   Return to work aug 27 th    What You Need to Know About Prescription Opioid Pain Medicine Opioids are powerful medicines that are used to treat moderate to severe pain. Opioids should be taken with the supervision of a trained health care provider. They should be taken for the shortest period of time as possible. This is because opioids can be addictive and the longer you take opioids, the greater your risk of addiction (opioid use disorder). What do opioids do? Opioids help reduce or eliminate pain. When used for short periods of time, they can help you:  Sleep better.  Do better in physical or occupational therapy.  Feel better in the first few days after an injury.  Recover from surgery.  What kind of problems can opioids cause? Opioids can cause side effects, such as:  Constipation.  Nausea.  Vomiting.  Drowsiness.  Confusion.  Opioid use disorder.  Breathing difficulties (respiratory depression).  Using opioid pain medicines for longer than 3 days increases your risk of these side effects. Taking opioid pain medicine for a long period of time can affect your ability to do daily tasks. It also puts you at risk for:  Car accidents.  Heart attack.  Overdose, which can sometimes lead to death.  What can increase my risk for developing problems while taking opioids? You may be at an especially high risk for problems while taking opioids if you:  Are over the age of 49.  Are pregnant.  Have kidney or liver disease.  Have certain mental health conditions, such as depression or anxiety.  Have a history of substance use disorder.  Have had an opioid overdose in the past.  How do I stop taking opioids if I have been taking them for a long time? If you have been taking opioid medicine for more than a few weeks, you may need to slowly stop taking them (taper). Tapering your use of opioids can decrease your chances  of experiencing withdrawal symptoms, such as:  Abdominal pain and cramping.  Nausea.  Sweating.  Sleepiness.  Restlessness.  Uncontrollable shaking (tremors).  Cravings for the medicine.  Do not attempt to taper your use of opioids on your own. Talk with your health care provider about how to do this. Your health care provider may prescribe a step-down schedule based on how much medicine you are taking and how long you have been taking it. What are the benefits of stopping the use of opioids? By switching from opioid pain medicine to non-opioid pain management options, you will decrease your risk of accidents and injuries associated with long-term opioid use. You will also be able to:  Monitor your pain more accurately and know when to seek medical care if it is not improving.  Decrease risk to others around you. Having opioids in the home increases the risk for accidental or intentional use or overdose by others.  How can I treat pain without opioids? Pain can be managed with many types of alternative treatments. Ask your health care provider to refer you to one or more specialists who can help you manage pain through:  Physical or occupational therapy.  Counseling (cognitive-behavioral therapy).  Good nutrition.  Biofeedback.  Massage.  Meditation.  Non-opioid medicine.  Following a gentle exercise program.  Where can I get support? If you have been taking opioids for a long time, you may benefit from receiving support for quitting from a local support group or  counselor. Ask your health care provider for a referral to these resources in your area. When should I seek medical care? Seek medical care right away if you are taking opioids and you experience any of the following:  Difficulty breathing.  Breathing that is more shallow or slower than normal.  A very slow heartbeat (pulse).  Severe confusion.  Unconsciousness.  Sleepiness.  Difficulty waking from  sleep.  Slurred speech.  Nausea and vomiting.  Cold, clammy skin.  Blue lips or fingernails.  Limpness.  Abnormally small pupils.  If you think that you or someone else may have taken too much of an opioid medicine, get medical help right away. Do not wait to see if the symptoms go away on their own. Call your local emergency services (911 in the [place] or call the hotline of the [facility] 224-779-1341(820-103-1570 in the [place] Where can I get more information? To learn more about opioid medicines, visit the Centers for Disease Control and Prevention web site Opioid Basics at [employer].com.pthttps://www.cdc.gov/drugoverdos/opioids/index.html. Summary  Opioid medicines can help you manage moderate-to-severe pain for a short period of time.  Taking opioid pain medicine for a long period of time puts you at risk for unintentional accidents, injury, and even death.  If you think that you or someone else may have taken too much of an opioid, get medical help right away. This information is not intended to replace advice given to you by your health care provider. Make sure you discuss any questions you have with your health care provider. Document Released: 10/20/2015 Document Revised: 05/17/2016 Document Reviewed: 05/05/2015 Elsevier Interactive Patient Education  2017 [employer]

## 2017-05-02 NOTE — Progress Notes (Signed)
Routine office follow-up visit  Status post right rotator cuff repair on 11/14/2016  The patient had a right rotator cuff repair for massive cuff tear. He has regained of forward elevation of the shoulder with some scapular dysrhythmia  He complains of mild soreness and pain requested prescription for hydrocodone  Review of systems neurologic symptoms are negative in the right arm  Exam he is awake alert and oriented 3 his appearance is normal. He can elevate his arm 150 active 180 passive as external rotation with his arm at his side of 45 his shoulder is stable he has mild weakness in abduction none in the scapular plane neurovascular exam is intact  It will be 6 months postop August 8,  he wishes to return to work August 27 he is cleared to do that  I will see him again in 3 months  Encounter Diagnoses  Name Primary?  Marland Kitchen. Aftercare following surgery Yes  . History of rotator cuff surgery

## 2017-05-02 NOTE — Telephone Encounter (Signed)
Discussed forms, updates. Authorizations on file ([employer] form and [employer] insurance form/Short-term disability forms faxed per request.

## 2017-05-22 ENCOUNTER — Other Ambulatory Visit: Payer: Self-pay | Admitting: Orthopedic Surgery

## 2017-05-22 DIAGNOSIS — Z9889 Other specified postprocedural states: Secondary | ICD-10-CM

## 2017-05-22 DIAGNOSIS — Z4889 Encounter for other specified surgical aftercare: Secondary | ICD-10-CM

## 2017-05-23 ENCOUNTER — Other Ambulatory Visit: Payer: Self-pay | Admitting: Orthopedic Surgery

## 2017-05-23 DIAGNOSIS — Z9889 Other specified postprocedural states: Secondary | ICD-10-CM

## 2017-05-23 DIAGNOSIS — Z4889 Encounter for other specified surgical aftercare: Secondary | ICD-10-CM

## 2017-05-23 MED ORDER — HYDROCODONE-ACETAMINOPHEN 5-325 MG PO TABS: 1.0000 | ORAL_TABLET | Freq: Three times a day (TID) | ORAL | 0 refills | Status: DC | PRN

## 2017-06-16 ENCOUNTER — Other Ambulatory Visit: Payer: Self-pay | Admitting: Orthopedic Surgery

## 2017-06-16 DIAGNOSIS — Z9889 Other specified postprocedural states: Secondary | ICD-10-CM

## 2017-06-16 DIAGNOSIS — Z4889 Encounter for other specified surgical aftercare: Secondary | ICD-10-CM

## 2017-06-16 MED ORDER — HYDROCODONE-ACETAMINOPHEN 5-325 MG PO TABS: 1.0000 | ORAL_TABLET | Freq: Three times a day (TID) | ORAL | 0 refills | Status: DC | PRN

## 2017-07-10 ENCOUNTER — Other Ambulatory Visit: Payer: Self-pay | Admitting: Orthopedic Surgery

## 2017-07-10 DIAGNOSIS — Z4889 Encounter for other specified surgical aftercare: Secondary | ICD-10-CM

## 2017-07-10 DIAGNOSIS — Z9889 Other specified postprocedural states: Secondary | ICD-10-CM

## 2017-07-11 ENCOUNTER — Other Ambulatory Visit: Payer: Self-pay | Admitting: Orthopedic Surgery

## 2017-07-11 DIAGNOSIS — Z9889 Other specified postprocedural states: Secondary | ICD-10-CM

## 2017-07-11 DIAGNOSIS — Z4889 Encounter for other specified surgical aftercare: Secondary | ICD-10-CM

## 2017-07-11 MED ORDER — HYDROCODONE-ACETAMINOPHEN 5-325 MG PO TABS: 1.0000 | ORAL_TABLET | Freq: Three times a day (TID) | ORAL | 0 refills | Status: DC | PRN

## 2017-07-28 ENCOUNTER — Other Ambulatory Visit: Payer: Self-pay | Admitting: Orthopedic Surgery

## 2017-07-28 DIAGNOSIS — Z9889 Other specified postprocedural states: Secondary | ICD-10-CM

## 2017-07-28 DIAGNOSIS — Z4889 Encounter for other specified surgical aftercare: Secondary | ICD-10-CM

## 2017-07-28 NOTE — Telephone Encounter (Signed)
Should not need further opioids at this point

## 2017-07-30 ENCOUNTER — Other Ambulatory Visit: Payer: Self-pay | Admitting: Orthopedic Surgery

## 2017-07-30 DIAGNOSIS — Z4889 Encounter for other specified surgical aftercare: Secondary | ICD-10-CM

## 2017-07-30 DIAGNOSIS — Z9889 Other specified postprocedural states: Secondary | ICD-10-CM

## 2017-08-01 ENCOUNTER — Telehealth: Payer: Self-pay | Admitting: Orthopedic Surgery

## 2017-08-01 DIAGNOSIS — Z9889 Other specified postprocedural states: Secondary | ICD-10-CM

## 2017-08-01 DIAGNOSIS — Z4889 Encounter for other specified surgical aftercare: Secondary | ICD-10-CM

## 2017-08-01 NOTE — Telephone Encounter (Signed)
Contacted patient, notified.

## 2017-08-01 NOTE — Telephone Encounter (Signed)
Called patient, notified.

## 2017-08-01 NOTE — Telephone Encounter (Signed)
Last visit with you was 05/02/17 He is returning to see you on 08/08/17  Wants to know if you will give Flexeril for his pain? You gave this last in Feb, I have pended it, if you want him to have, sign, if not let me know, I can call him on Monday
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
## 2017-08-02 ENCOUNTER — Other Ambulatory Visit: Payer: Self-pay | Admitting: Orthopedic Surgery

## 2017-08-02 DIAGNOSIS — Z9889 Other specified postprocedural states: Secondary | ICD-10-CM

## 2017-08-02 DIAGNOSIS — Z4889 Encounter for other specified surgical aftercare: Secondary | ICD-10-CM

## 2017-08-02 MED ORDER — CYCLOBENZAPRINE HCL 10 MG PO TABS: 10.0000 mg | ORAL_TABLET | Freq: Three times a day (TID) | ORAL | 1 refills | Status: DC | PRN

## 2017-08-04 ENCOUNTER — Ambulatory Visit: Payer: BC Managed Care – PPO | Admitting: Orthopedic Surgery

## 2017-08-08 ENCOUNTER — Encounter: Payer: Self-pay | Admitting: Orthopedic Surgery

## 2017-08-08 ENCOUNTER — Ambulatory Visit (INDEPENDENT_AMBULATORY_CARE_PROVIDER_SITE_OTHER): Payer: BC Managed Care – PPO | Admitting: Orthopedic Surgery

## 2017-08-08 VITALS — BP 134/95 | HR 110 | Ht 65.0 in | Wt 225.0 lb

## 2017-08-08 DIAGNOSIS — Z4889 Encounter for other specified surgical aftercare: Secondary | ICD-10-CM

## 2017-08-08 DIAGNOSIS — M25511 Pain in right shoulder: Secondary | ICD-10-CM | POA: Diagnosis not present

## 2017-08-08 DIAGNOSIS — Z9889 Other specified postprocedural states: Secondary | ICD-10-CM | POA: Diagnosis not present

## 2017-08-08 MED ORDER — HYDROCODONE-ACETAMINOPHEN 5-325 MG PO TABS: 1.0000 | ORAL_TABLET | Freq: Every day | ORAL | 0 refills | Status: DC
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
## 2017-08-08 NOTE — Patient Instructions (Signed)
Also try alternatives to opioid medication for relief take the Naprosyn the Flexeril use a muscle cream use a heating pad if you still having pain then take 1 hydrocodone at night.  What You Need to Know About Prescription Opioid Pain Medicine        Please be advised. You are on a medication which is classified as an "opiod". The CDC the [facility] [place] [facility]  has recently advised all providers to advise patient's that these medications have certain risks which include but are not limited to:    drug intolerance  drug addiction  respiratory depression   respiratory failure  Death  Please keep these medications locked away. If you feel that you are becoming addicted to these medicines or you are having difficulties with these medications please alert your provider.   As your provider I will attempt to wean you off of these medications when you're severe acute pain has been taking care of. However, if we cannot wean you off of this medication you will be sent to a pain management center where they can better manage chronic pain   Opioids are powerful medicines that are used to treat moderate to severe pain. Opioids should be taken with the supervision of a trained health care provider. They should be taken for the shortest period of time as possible. This is because opioids can be addictive and the longer you take opioids, the greater your risk of addiction (opioid use disorder). What do opioids do? Opioids help reduce or eliminate pain. When used for short periods of time, they can help you:  Sleep better.  Do better in physical or occupational therapy.  Feel better in the first few days after an injury.  Recover from surgery. What kind of problems can opioids cause? Opioids can cause side effects, such as:  Constipation.  Nausea.  Vomiting.  Drowsiness.  Confusion.  Opioid use disorder.  Breathing difficulties (respiratory depression). Using opioid pain  medicines for longer than 3 days increases your risk of these side effects. Taking opioid pain medicine for a long period of time can affect your ability to do daily tasks. It also puts you at risk for:  Car accidents.  Heart attack.  Overdose, which can sometimes lead to death. What can increase my risk for developing problems while taking opioids? You may be at an especially high risk for problems while taking opioids if you:  Are over the age of 49.  Are pregnant.  Have kidney or liver disease.  Have certain mental health conditions, such as depression or anxiety.  Have a history of substance use disorder.  Have had an opioid overdose in the past. How do I stop taking opioids if I have been taking them for a long time? If you have been taking opioid medicine for more than a few weeks, you may need to slowly stop taking them (taper). Tapering your use of opioids can decrease your chances of experiencing withdrawal symptoms, such as:  Abdominal pain and cramping.  Nausea.  Sweating.  Sleepiness.  Restlessness.  Uncontrollable shaking (tremors).  Cravings for the medicine. Do not attempt to taper your use of opioids on your own. Talk with your health care provider about how to do this. Your health care provider may prescribe a step-down schedule based on how much medicine you are taking and how long you have been taking it. What are the benefits of stopping the use of opioids? By switching from opioid pain medicine to non-opioid  pain management options, you will decrease your risk of accidents and injuries associated with long-term opioid use. You will also be able to:  Monitor your pain more accurately and know when to seek medical care if it is not improving.  Decrease risk to others around you. Having opioids in the home increases the risk for accidental or intentional use or overdose by others. How can I treat pain without opioids? Pain can be managed with many types  of alternative treatments. Ask your health care provider to refer you to one or more specialists who can help you manage pain through:  Physical or occupational therapy.  Counseling (cognitive-behavioral therapy).  Good nutrition.  Biofeedback.  Massage.  Meditation.  Non-opioid medicine.  Following a gentle exercise program. Where can I get support? If you have been taking opioids for a long time, you may benefit from receiving support for quitting from a local support group or counselor. Ask your health care provider for a referral to these resources in your area. When should I seek medical care? Seek medical care right away if you are taking opioids and you experience any of the following:  Difficulty breathing.  Breathing that is more shallow or slower than normal.  A very slow heartbeat (pulse).  Severe confusion.  Unconsciousness.  Sleepiness.  Difficulty waking from sleep.  Slurred speech.  Nausea and vomiting.  Cold, clammy skin.  Blue lips or fingernails.  Limpness.  Abnormally small pupils. If you think that you or someone else may have taken too much of an opioid medicine, get medical help right away. Do not wait to see if the symptoms go away on their own. Call your local emergency services (911 in the [place] or call the hotline of the [facility] 5854740119 in the [place]  Where can I get more information? To learn more about opioid medicines, visit the Centers for Disease Control and Prevention web site Opioid Basics at [employer].pt. Summary  Opioid medicines can help you manage moderate-to-severe pain for a short period of time.  Taking opioid pain medicine for a long period of time puts you at risk for unintentional accidents, injury, and even death.  If you think that you or someone else may have taken too much of an opioid, get medical help right away. This information is not intended  to replace advice given to you by your health care provider. Make sure you discuss any questions you have with your health care provider. Document Released: 10/20/2015 Document Revised: 05/17/2016 Document Reviewed: 05/05/2015 Elsevier Interactive Patient Education  2017 [employer].

## 2017-08-08 NOTE — Addendum Note (Signed)
Addended by: Fuller CanadaHARRISON, Malicia Blasdel E on: 08/08/2017 11:49 AM   Modules accepted: Orders

## 2017-09-05 ENCOUNTER — Other Ambulatory Visit: Payer: Self-pay | Admitting: Orthopedic Surgery

## 2017-09-05 DIAGNOSIS — Z9889 Other specified postprocedural states: Secondary | ICD-10-CM

## 2017-09-05 DIAGNOSIS — Z4889 Encounter for other specified surgical aftercare: Secondary | ICD-10-CM

## 2017-09-09 NOTE — Telephone Encounter (Signed)
Last prescribed 08/08/17  S/p RCR in Feb 2018

## 2017-09-09 NOTE — Telephone Encounter (Signed)
Please try alternative pain management   Ibuprofen Tylenol

## 2017-09-10 ENCOUNTER — Telehealth: Payer: Self-pay | Admitting: Radiology

## 2017-09-10 NOTE — Telephone Encounter (Signed)
I left message for patient to call me back time to d/c hydrocodone try tylenol/ ibuprofen

## 2017-09-10 NOTE — Telephone Encounter (Signed)
I have called patient to advise, left message for him to call me back.

## 2017-09-11 NOTE — Telephone Encounter (Signed)
Spoke with patient and relayed Amy's message to him. I told him to call us if he has any other concerns.

## 2017-10-14 ENCOUNTER — Emergency Department (HOSPITAL_COMMUNITY)
Admission: EM | Admit: 2017-10-14 | Discharge: 2017-10-14 | Disposition: A | Discharge status: Home / Self Care | Payer: BC Managed Care – PPO | Attending: Emergency Medicine | Admitting: Emergency Medicine

## 2017-10-14 ENCOUNTER — Encounter (HOSPITAL_COMMUNITY): Payer: Self-pay | Admitting: *Deleted

## 2017-10-14 DIAGNOSIS — R739 Hyperglycemia, unspecified: Secondary | ICD-10-CM

## 2017-10-14 DIAGNOSIS — R1030 Lower abdominal pain, unspecified: Secondary | ICD-10-CM | POA: Insufficient documentation

## 2017-10-14 DIAGNOSIS — R339 Retention of urine, unspecified: Secondary | ICD-10-CM | POA: Diagnosis not present

## 2017-10-14 DIAGNOSIS — Z79899 Other long term (current) drug therapy: Secondary | ICD-10-CM | POA: Insufficient documentation

## 2017-10-14 LAB — CBC WITH DIFFERENTIAL/PLATELET
BASOS PCT: 1 %
Basophils Absolute: 0 10*3/uL (ref 0.0–0.1)
EOS ABS: 0.2 10*3/uL (ref 0.0–0.7)
EOS PCT: 3 %
HCT: 39.2 % (ref 39.0–52.0)
HEMOGLOBIN: 13 g/dL (ref 13.0–17.0)
LYMPHS ABS: 1.8 10*3/uL (ref 0.7–4.0)
Lymphocytes Relative: 39 %
MCH: 29.3 pg (ref 26.0–34.0)
MCHC: 33.2 g/dL (ref 30.0–36.0)
MCV: 88.3 fL (ref 78.0–100.0)
Monocytes Absolute: 0.3 10*3/uL (ref 0.1–1.0)
Monocytes Relative: 7 %
NEUTROS PCT: 50 %
Neutro Abs: 2.4 10*3/uL (ref 1.7–7.7)
Platelets: 191 10*3/uL (ref 150–400)
RBC: 4.44 MIL/uL (ref 4.22–5.81)
RDW: 12.5 % (ref 11.5–15.5)
WBC: 4.8 10*3/uL (ref 4.0–10.5)

## 2017-10-14 LAB — BASIC METABOLIC PANEL
Anion gap: 10 (ref 5–15)
BUN: 15 mg/dL (ref 6–20)
CO2: 22 mmol/L (ref 22–32)
Calcium: 8.7 mg/dL — ABNORMAL LOW (ref 8.9–10.3)
Chloride: 100 mmol/L — ABNORMAL LOW (ref 101–111)
Creatinine, Ser: 1.13 mg/dL (ref 0.61–1.24)
Glucose, Bld: 274 mg/dL — ABNORMAL HIGH (ref 65–99)
POTASSIUM: 3.7 mmol/L (ref 3.5–5.1)
SODIUM: 132 mmol/L — AB (ref 135–145)

## 2017-10-14 LAB — URINALYSIS, ROUTINE W REFLEX MICROSCOPIC
BACTERIA UA: NONE SEEN
BILIRUBIN URINE: NEGATIVE
Glucose, UA: >=500 mg/dL — AB
KETONES UR: NEGATIVE mg/dL
LEUKOCYTES UA: NEGATIVE
NITRITE: NEGATIVE
PROTEIN: NEGATIVE mg/dL
Specific Gravity, Urine: 1.027 (ref 1.005–1.030)
Squamous Epithelial / LPF: NONE SEEN
pH: 5 (ref 5.0–8.0)

## 2017-10-14 MED ORDER — HYDROCODONE-ACETAMINOPHEN 5-325 MG PO TABS
1.0000 | ORAL_TABLET | Freq: Once | ORAL | Status: AC
  Administered 2017-10-14: 1 via ORAL
  Filled 2017-10-14: qty 1

## 2017-10-14 MED ORDER — HYDROCODONE-ACETAMINOPHEN 5-325 MG PO TABS: ORAL_TABLET | ORAL | 0 refills | Status: DC

## 2017-10-14 NOTE — ED Notes (Signed)
Leg bag applied and education done with pt and family

## 2017-10-14 NOTE — ED Notes (Signed)
Pt notice blood around foley catheter after moving leg.  Urine is blood tingled and vary small amount noted around foley catheter.  Leg bag was exchanged by R. Cockram, RN.  Pt and friend educated on s/s to look for with new foley catheter and verbally acknowledges what to look out for.   Instructed to return to ED if bleeding increases or notice clots in bag.  Reviewed AVS with pt.

## 2017-10-14 NOTE — ED Provider Notes (Signed)
[facility] EMERGENCY DEPARTMENT Provider Note   CSN: [id number] Arrival date & time: 10/14/17  1320     History   Chief Complaint Chief Complaint  Patient presents with  . Urinary Retention    HPI Francisco Fox is a 52 y.o. male.  HPI   Francisco Fox is a 52 y.o. male who presents to the Emergency Department complaining of sudden onset of urinary retention.  States he last voided normally this morning at 6 AM.  Went to work and try to urinate around noon unsuccessfully.  This afternoon, he reports increasing lower abdominal pain and inability to void.  He states this is a new problem.  He denies new medications, recent illness, flank pain, testicular pain or swelling or burning with urination.  No history of kidney stones or prostate problems.   Past Medical History:  Diagnosis Date  . Arthritis   . GERD (gastroesophageal reflux disease)     There are no active problems to display for this patient.   Past Surgical History:  Procedure Laterality Date  . CHOLECYSTECTOMY    . CYST EXCISION Right   . SHOULDER OPEN ROTATOR CUFF REPAIR Right 11/14/2016   Procedure: RIGHT OPEN ROTATOR CUFF REPAIR;  Surgeon: Vickki HearingStanley E Harrison, MD;  Location: [facility];  Service: Orthopedics;  Laterality: Right;     Home Medications    Prior to Admission medications   Medication Sig Start Date End Date Taking? Authorizing Provider  cyclobenzaprine (FLEXERIL) 10 MG tablet Take 1 tablet (10 mg total) by mouth 3 (three) times daily as needed. 08/02/17   Vickki HearingHarrison, Stanley E, MD  HYDROcodone-acetaminophen (NORCO) 5-325 MG tablet Take 1 tablet by mouth at bedtime. Last 1 mo 08/08/17   Vickki HearingHarrison, Stanley E, MD  naproxen (NAPROSYN) 500 MG tablet TAKE 1 TABLET(500 MG) BY MOUTH TWICE DAILY WITH A MEAL 03/10/17   Darreld McleanKeeling, Wayne, MD  omeprazole (PRILOSEC) 40 MG capsule Take 40 mg by mouth daily.    [provider]    Family History History reviewed. No pertinent family history.  Social  History Social History   Tobacco Use  . Smoking status: Never Smoker  . Smokeless tobacco: Never Used  Substance Use Topics  . Alcohol use: Yes    Comment: occasional  . Drug use: No     Allergies   Patient has no known allergies.   Review of Systems Review of Systems  Constitutional: Negative for activity change, appetite change, chills and fever.  Respiratory: Negative for chest tightness and shortness of breath.   Gastrointestinal: Negative for abdominal pain, nausea and vomiting.  Genitourinary: Positive for difficulty urinating. Negative for decreased urine volume, discharge, dysuria, flank pain, frequency, hematuria, penile swelling, scrotal swelling, testicular pain and urgency.  Musculoskeletal: Negative for back pain.  Skin: Negative for rash.  Neurological: Negative for dizziness, weakness and numbness.  Hematological: Negative for adenopathy.  Psychiatric/Behavioral: Negative for confusion.  All other systems reviewed and are negative.    Physical Exam Updated Vital Signs BP (!) 141/85 (BP Location: Right Arm)   Pulse 97   Temp 97.9 F (36.6 C) (Oral)   Resp 18   Ht 5\' 6"  (1.676 m)   Wt 102.1 kg (225 lb)   SpO2 97%   BMI 36.32 kg/m   Physical Exam  Constitutional: He is oriented to person, place, and time. He appears well-developed and well-nourished. No distress.  HENT:  Head: Normocephalic and atraumatic.  Cardiovascular: Normal rate, regular rhythm and intact distal pulses.  No murmur heard. Pulmonary/Chest: Effort normal and breath sounds normal. No respiratory distress. He has no wheezes. He has no rales.  Abdominal: Soft. Normal appearance. He exhibits no distension and no mass. There is no hepatosplenomegaly. There is tenderness in the suprapubic area. There is no rigidity, no rebound, no guarding, no CVA tenderness and no tenderness at McBurney's point.  Mild ttp of the suprapubic region.  Remaining abdomen is soft, non-tender without guarding  or rebound tenderness. No CVA tenderness  Musculoskeletal: Normal range of motion. He exhibits no edema.  Neurological: He is alert and oriented to person, place, and time. No sensory deficit. Coordination normal.  Skin: Skin is warm and dry. No rash noted.  Nursing note and vitals reviewed.    ED Treatments / Results  Labs (all labs ordered are listed, but only abnormal results are displayed) Labs Reviewed  URINALYSIS, ROUTINE W REFLEX MICROSCOPIC - Abnormal; Notable for the following components:      Result Value   Glucose, UA >=500 (*)    Hgb urine dipstick MODERATE (*)    All other components within normal limits  BASIC METABOLIC PANEL - Abnormal; Notable for the following components:   Sodium 132 (*)    Chloride 100 (*)    Glucose, Bld 274 (*)    Calcium 8.7 (*)    All other components within normal limits  CBC WITH DIFFERENTIAL/PLATELET    EKG  EKG Interpretation None       Radiology No results found.  Procedures Procedures (including critical care time)  Medications Ordered in ED Medications - No data to display   Initial Impression / Assessment and Plan / ED Course  I have reviewed the triage vital signs and the nursing notes.  Pertinent labs & imaging results that were available during my care of the patient were reviewed by me and considered in my medical decision making (see chart for details).      Bladder scanned at triage, volume 543 mL  Pt tried multiple times to void.  Able to only void very small amt, reports urgency and lower abdominal pressure, so will place foley catheter.    Labs show hyperglycemia.  No hx of DM.  No DKA.    #16 Foley catheter placed by nursing staff.  Patient is feeling much better.  Approximately 800 cc of dark yellow urine returned. Leg bag applied.  Patient agrees to follow-up with his PCP tomorrow regarding the hyperglycemia.  I will give referral information for urology as well.  Return precautions discussed.   Patient appears stable for discharge.  Final Clinical Impressions(s) / ED Diagnoses   Final diagnoses:  Urinary retention  Hyperglycemia    ED Discharge Orders    None       Pauline Aus, PA-C 10/14/17 1706    Raeford Razor, MD 10/15/17 [phone]

## 2017-10-14 NOTE — Discharge Instructions (Signed)
Keep the Foley catheter in place until you see your primary provider.  Your tests today show your blood sugar is elevated.  You will need to contact your primary provider to arrange a follow-up appt regarding this.   Also, call Dr. Shannan HarperBell's office (urology) to arrange a follow-up appointment.  Return to the ER for any worsening symptoms.

## 2017-10-14 NOTE — ED Triage Notes (Signed)
Pt with urinary retention starting today, feels like he has to go to urinate.

## 2018-01-08 ENCOUNTER — Other Ambulatory Visit: Payer: Self-pay | Admitting: Orthopaedic Surgery

## 2018-01-12 ENCOUNTER — Other Ambulatory Visit: Payer: Self-pay | Admitting: Orthopaedic Surgery

## 2018-01-12 MED ORDER — NAPROXEN 500 MG PO TABS: ORAL_TABLET | ORAL | 5 refills | Status: DC

## 2018-01-12 NOTE — Telephone Encounter (Signed)
Naproxen 500 mg Qty 60 Tablets  Take 1 tablet(500mg ) by mouth twice daily with a meal.  FAXED BY [facility]

## 2018-09-13 ENCOUNTER — Other Ambulatory Visit: Payer: Self-pay | Admitting: Orthopedic Surgery

## 2018-10-12 ENCOUNTER — Other Ambulatory Visit: Payer: Self-pay | Admitting: Orthopedic Surgery

## 2018-11-20 ENCOUNTER — Emergency Department (HOSPITAL_COMMUNITY)
Admission: EM | Admit: 2018-11-20 | Discharge: 2018-11-20 | Disposition: A | Discharge status: Home / Self Care | Payer: BC Managed Care – PPO | Attending: Emergency Medicine | Admitting: Emergency Medicine

## 2018-11-20 ENCOUNTER — Encounter (HOSPITAL_COMMUNITY): Payer: Self-pay | Admitting: Emergency Medicine

## 2018-11-20 ENCOUNTER — Other Ambulatory Visit: Payer: Self-pay

## 2018-11-20 DIAGNOSIS — R3915 Urgency of urination: Secondary | ICD-10-CM | POA: Insufficient documentation

## 2018-11-20 DIAGNOSIS — Z79899 Other long term (current) drug therapy: Secondary | ICD-10-CM | POA: Insufficient documentation

## 2018-11-20 DIAGNOSIS — E119 Type 2 diabetes mellitus without complications: Secondary | ICD-10-CM | POA: Insufficient documentation

## 2018-11-20 DIAGNOSIS — R339 Retention of urine, unspecified: Secondary | ICD-10-CM | POA: Diagnosis not present

## 2018-11-20 HISTORY — DX: Type 2 diabetes mellitus without complications: E11.9

## 2018-11-20 LAB — URINALYSIS, ROUTINE W REFLEX MICROSCOPIC
BILIRUBIN URINE: NEGATIVE
Glucose, UA: NEGATIVE mg/dL
Ketones, ur: NEGATIVE mg/dL
Leukocytes,Ua: NEGATIVE
Nitrite: NEGATIVE
PH: 5 (ref 5.0–8.0)
Protein, ur: NEGATIVE mg/dL
SPECIFIC GRAVITY, URINE: 1.025 (ref 1.005–1.030)

## 2018-11-20 NOTE — ED Provider Notes (Signed)
[facility] EMERGENCY DEPARTMENT Provider Note   CSN: [id number] Arrival date & time: 11/20/18  1443     History   Chief Complaint Chief Complaint  Patient presents with  . Urinary Retention    HPI Francisco Fox is a 53 y.o. male.  HPI Patient with history of diabetes and previous urinary retention states he has had difficulty urinating since 8 AM this morning.  States he has the urgency to urinate but is only able to produce very small amount.  Denies any nausea or vomiting.  No abdominal pain.  States he is urinating well yesterday.  No hematuria or dysuria.  Patient is on Flomax.  No recent medication changes.  No fever or chills. Past Medical History:  Diagnosis Date  . Arthritis   . Diabetes mellitus without complication (HCC)   . GERD (gastroesophageal reflux disease)     There are no active problems to display for this patient.   Past Surgical History:  Procedure Laterality Date  . CHOLECYSTECTOMY    . CYST EXCISION Right   . SHOULDER OPEN ROTATOR CUFF REPAIR Right 11/14/2016   Procedure: RIGHT OPEN ROTATOR CUFF REPAIR;  Surgeon: Vickki Hearing, MD;  Location: [facility];  Service: Orthopedics;  Laterality: Right;        Home Medications    Prior to Admission medications   Medication Sig Start Date End Date Taking? Authorizing Provider  cyclobenzaprine (FLEXERIL) 10 MG tablet Take 1 tablet (10 mg total) by mouth 3 (three) times daily as needed. 08/02/17  Yes Vickki Hearing, MD  omeprazole (PRILOSEC) 40 MG capsule Take 40 mg by mouth daily.   Yes [provider]  HYDROcodone-acetaminophen (NORCO/VICODIN) 5-325 MG tablet Take one tab po q 4 hrs prn pain 10/14/17   Triplett, Tammy, PA-C  naproxen (NAPROSYN) 500 MG tablet TAKE 1 TABLET(500 MG) BY MOUTH TWICE DAILY WITH A MEAL 10/12/18   Vickki Hearing, MD    Family History No family history on file.  Social History Social History   Tobacco Use  . Smoking status: Never Smoker  . Smokeless  tobacco: Never Used  Substance Use Topics  . Alcohol use: Yes    Comment: occasional  . Drug use: No     Allergies   Patient has no known allergies.   Review of Systems Review of Systems  Constitutional: Negative for chills and fever.  Respiratory: Negative for cough and shortness of breath.   Cardiovascular: Negative for chest pain.  Gastrointestinal: Negative for abdominal pain, diarrhea, nausea and vomiting.  Genitourinary: Positive for difficulty urinating. Negative for dysuria, flank pain, frequency, hematuria and penile pain.  Musculoskeletal: Negative for back pain, myalgias and neck pain.  Skin: Negative for rash and wound.  Neurological: Negative for dizziness, weakness, light-headedness, numbness and headaches.  All other systems reviewed and are negative.    Physical Exam Updated Vital Signs BP (!) 161/95 (BP Location: Right Arm)   Pulse (!) 117   Temp 98.1 F (36.7 C) (Temporal)   Resp 18   Ht 5\' 5"  (1.651 m)   Wt 99.8 kg   SpO2 96%   BMI 36.61 kg/m   Physical Exam Vitals signs and nursing note reviewed.  Constitutional:      Appearance: Normal appearance. He is well-developed.  HENT:     Head: Normocephalic and atraumatic.     Mouth/Throat:     Mouth: Mucous membranes are moist.  Eyes:     Pupils: Pupils are equal, round, and reactive  to light.  Neck:     Musculoskeletal: Normal range of motion and neck supple.  Cardiovascular:     Rate and Rhythm: Normal rate and regular rhythm.  Pulmonary:     Effort: Pulmonary effort is normal. No respiratory distress.     Breath sounds: Normal breath sounds. No stridor. No wheezing, rhonchi or rales.  Chest:     Chest wall: No tenderness.  Abdominal:     General: Bowel sounds are normal. There is no distension.     Palpations: Abdomen is soft. There is no mass.     Tenderness: There is no abdominal tenderness. There is no right CVA tenderness, left CVA tenderness, guarding or rebound.  Musculoskeletal:  Normal range of motion.        General: No swelling, tenderness, deformity or signs of injury.     Right lower leg: No edema.     Left lower leg: No edema.  Skin:    General: Skin is warm and dry.     Capillary Refill: Capillary refill takes less than 2 seconds.     Findings: No erythema or rash.  Neurological:     General: No focal deficit present.     Mental Status: He is alert and oriented to person, place, and time.  Psychiatric:        Mood and Affect: Mood normal.        Behavior: Behavior normal.      ED Treatments / Results  Labs (all labs ordered are listed, but only abnormal results are displayed) Labs Reviewed  URINALYSIS, ROUTINE W REFLEX MICROSCOPIC - Abnormal; Notable for the following components:      Result Value   Hgb urine dipstick SMALL (*)    Bacteria, UA RARE (*)    All other components within normal limits    EKG None  Radiology No results found.  Procedures Procedures (including critical care time)  Medications Ordered in ED Medications - No data to display   Initial Impression / Assessment and Plan / ED Course  I have reviewed the triage vital signs and the nursing notes.  Pertinent labs & imaging results that were available during my care of the patient were reviewed by me and considered in my medical decision making (see chart for details).     Foley cath placed with range of 1 L of urine.  No evidence of infection.  Patient is on Flomax currently.  Advised to follow-up closely with urology.  Return precautions given.  Final Clinical Impressions(s) / ED Diagnoses   Final diagnoses:  Urinary retention    ED Discharge Orders    None       Loren Racer, MD 11/20/18 2008

## 2018-11-20 NOTE — ED Triage Notes (Signed)
Unable to urinate since 0800 today

## 2018-11-20 NOTE — Discharge Instructions (Addendum)
Follow-up closely with urology.

## 2019-01-29 ENCOUNTER — Other Ambulatory Visit: Payer: Self-pay | Admitting: Orthopedic Surgery

## 2019-03-03 ENCOUNTER — Other Ambulatory Visit: Payer: Self-pay | Admitting: Orthopedic Surgery

## 2019-03-23 ENCOUNTER — Other Ambulatory Visit: Payer: Self-pay | Admitting: Orthopedic Surgery

## 2019-03-30 ENCOUNTER — Ambulatory Visit: Payer: BC Managed Care – PPO | Admitting: Orthopaedic Surgery

## 2019-03-30 ENCOUNTER — Encounter: Payer: Self-pay | Admitting: Orthopaedic Surgery

## 2019-03-30 ENCOUNTER — Ambulatory Visit (INDEPENDENT_AMBULATORY_CARE_PROVIDER_SITE_OTHER): Payer: BC Managed Care – PPO

## 2019-03-30 ENCOUNTER — Other Ambulatory Visit: Payer: Self-pay

## 2019-03-30 VITALS — BP 148/91 | HR 96 | Temp 97.9°F | Ht 65.0 in | Wt 227.0 lb

## 2019-03-30 DIAGNOSIS — M25561 Pain in right knee: Secondary | ICD-10-CM

## 2019-03-30 MED ORDER — NAPROXEN 500 MG PO TABS: 500.0000 mg | ORAL_TABLET | Freq: Two times a day (BID) | ORAL | 5 refills | Status: DC

## 2019-03-30 NOTE — Progress Notes (Signed)
[name], male DOB:[birth year], 53 y.o. MPN:[id number]  Chief Complaint  Patient presents with  . Knee Pain    right    HPI  Francisco Fox is a 53 y.o. male who has developed pain in the right knee.  He has had left knee pain in the past.  He has swelling and popping but no giving way. It has been present several weeks and not getting better.  He is out of his naproxen.     Body mass index is 37.77 kg/m.  ROS  Review of Systems  Constitutional: Positive for activity change.  Musculoskeletal: Positive for arthralgias, gait problem and joint swelling.  All other systems reviewed and are negative.   All other systems reviewed and are negative.  The following is a summary of the past history medically, past history surgically, known current medicines, social history and family history.  This information is gathered electronically by the computer from prior information and documentation.  I review this each visit and have found including this information at this point in the chart is beneficial and informative.    Past Medical History:  Diagnosis Date  . Arthritis   . Diabetes mellitus without complication ([place])   . GERD (gastroesophageal reflux disease)     Past Surgical History:  Procedure Laterality Date  . CHOLECYSTECTOMY    . CYST EXCISION Right   . SHOULDER OPEN ROTATOR CUFF REPAIR Right 11/14/2016   Procedure: RIGHT OPEN ROTATOR CUFF REPAIR;  Surgeon: Carole Civil, MD;  Location: [facility];  Service: Orthopedics;  Laterality: Right;    History reviewed. No pertinent family history.  Social History Social History   Tobacco Use  . Smoking status: Never Smoker  . Smokeless tobacco: Never Used  Substance Use Topics  . Alcohol use: Yes    Comment: occasional  . Drug use: No    No Known Allergies  Current Outpatient Medications  Medication Sig Dispense Refill  . fluticasone (FLONASE) 50 MCG/ACT nasal spray Place into both nostrils daily.     Marland Kitchen levocetirizine (XYZAL) 5 MG tablet Take 5 mg by mouth every evening.    . metFORMIN (GLUCOPHAGE) 500 MG tablet TK 1 T PO BID    . omeprazole (PRILOSEC) 40 MG capsule Take 40 mg by mouth daily.    . sitaGLIPtin (JANUVIA) 100 MG tablet Take 100 mg by mouth daily.    . tamsulosin (FLOMAX) 0.4 MG CAPS capsule Take 0.4 mg by mouth.    Marland Kitchen atorvastatin (LIPITOR) 20 MG tablet     . naproxen (NAPROSYN) 500 MG tablet TAKE 1 TABLET(500 MG) BY MOUTH TWICE DAILY WITH A MEAL (Patient not taking: Reported on 03/30/2019) 60 tablet 0  . naproxen (NAPROSYN) 500 MG tablet Take 1 tablet (500 mg total) by mouth 2 (two) times daily with a meal. 60 tablet 5   No current facility-administered medications for this visit.      Physical Exam  Blood pressure (!) 148/91, pulse 96, temperature 97.9 F (36.6 C), height 5\' 5"  (1.651 m), weight 227 lb (103 kg).  Constitutional: overall normal hygiene, normal nutrition, well developed, normal grooming, normal body habitus. Assistive device:none  Musculoskeletal: gait and station Limp right, muscle tone and strength are normal, no tremors or atrophy is present.  .  Neurological: coordination overall normal.  Deep tendon reflex/nerve stretch intact.  Sensation normal.  Cranial nerves II-XII intact.   Skin:   Normal overall no scars, lesions, ulcers or rashes. No psoriasis.  Psychiatric: Alert  and oriented x 3.  Recent memory intact, remote memory unclear.  Normal mood and affect. Well groomed.  Good eye contact.  Cardiovascular: overall no swelling, no varicosities, no edema bilaterally, normal temperatures of the legs and arms, no clubbing, cyanosis and good capillary refill.  Lymphatic: palpation is normal.  Right knee has effusion, slight crepitus, ROM 0 to 110, limp right, weakly positive medial [place], medial pain, NV intact.  All other systems reviewed and are negative   The patient has been educated about the nature of the problem(s) and counseled on  treatment options.  The patient appeared to understand what I have discussed and is in agreement with it.  X-rays were done of the right knee, reported separately.  Encounter Diagnosis  Name Primary?  . Acute pain of right knee Yes   PROCEDURE NOTE:  The patient requests injections of the right knee , verbal consent was obtained.  The right knee was prepped appropriately after time out was performed.   Sterile technique was observed and injection of 1 cc of Depo-Medrol 40 mg with several cc's of plain xylocaine. Anesthesia was provided by ethyl chloride and a 20-gauge needle was used to inject the knee area. The injection was tolerated well.  A band aid dressing was applied.  The patient was advised to apply ice later today and tomorrow to the injection sight as needed.   PLAN Call if any problems.  Precautions discussed.  Continue current medications.   Return to clinic 2 weeks   Electronically Signed Darreld McleanWayne Angellynn Kimberlin, MD 6/23/20209:24 AM

## 2019-04-13 ENCOUNTER — Ambulatory Visit: Payer: BC Managed Care – PPO | Admitting: Orthopaedic Surgery

## 2019-06-29 ENCOUNTER — Telehealth: Payer: Self-pay | Admitting: Orthopaedic Surgery

## 2019-06-29 NOTE — Telephone Encounter (Signed)
Spoke with patient; scheduled appointment as discussed. Patient stated he would like a refill on the medication: naproxen (NAPROSYN) 500 MG tablet 60 tablet    - relayed, per Dr Brooke Bonito review, that he has refills remaining on this prescription at [facility] [place] he will call pharmacy. Aware of appointment.

## 2019-07-20 ENCOUNTER — Encounter: Payer: Self-pay | Admitting: Orthopaedic Surgery

## 2019-07-20 ENCOUNTER — Other Ambulatory Visit: Payer: Self-pay

## 2019-07-20 ENCOUNTER — Ambulatory Visit: Payer: BC Managed Care – PPO | Admitting: Orthopaedic Surgery

## 2019-07-20 DIAGNOSIS — G8929 Other chronic pain: Secondary | ICD-10-CM | POA: Diagnosis not present

## 2019-07-20 DIAGNOSIS — M25561 Pain in right knee: Secondary | ICD-10-CM | POA: Diagnosis not present

## 2019-07-20 NOTE — Progress Notes (Signed)
PROCEDURE NOTE:  The patient request injection, verbal consent was obtained.  The right knee was prepped appropriately after time out was performed.   Sterile technique was observed and anesthesia was provided by ethyl chloride and a 20-gauge needle was used to inject the knee area.  A 16-gauge needle was then used to aspirate the knee.  Color of fluid aspirated was straw  Total cc's aspirated was 30.    Injection of 1 cc of Depo-Medrol 40 mg with several cc's of plain xylocaine was then performed.  A band aid dressing was applied.  The patient was advised to apply ice later today and tomorrow to the injection sight as needed.  See as needed.  Electronically Signed Sanjuana Kava, MD 10/13/20208:18 AM

## 2019-08-04 ENCOUNTER — Other Ambulatory Visit: Payer: Self-pay | Admitting: *Deleted

## 2019-08-04 DIAGNOSIS — Z20822 Contact with and (suspected) exposure to covid-19: Secondary | ICD-10-CM

## 2019-08-05 LAB — NOVEL CORONAVIRUS, NAA: SARS-CoV-2, NAA: NOT DETECTED

## 2019-08-25 ENCOUNTER — Other Ambulatory Visit: Payer: Self-pay | Admitting: Orthopaedic Surgery

## 2019-09-09 ENCOUNTER — Other Ambulatory Visit: Payer: Self-pay

## 2019-09-09 ENCOUNTER — Ambulatory Visit: Payer: BC Managed Care – PPO | Admitting: Orthopaedic Surgery

## 2019-09-09 ENCOUNTER — Encounter: Payer: Self-pay | Admitting: Orthopaedic Surgery

## 2019-09-09 ENCOUNTER — Ambulatory Visit: Payer: BC Managed Care – PPO

## 2019-09-09 VITALS — Temp 97.5°F | Ht 65.0 in | Wt 236.5 lb

## 2019-09-09 DIAGNOSIS — M25551 Pain in right hip: Secondary | ICD-10-CM

## 2019-09-09 MED ORDER — PREDNISONE 5 MG (21) PO TBPK: ORAL_TABLET | ORAL | 0 refills | Status: DC

## 2019-09-09 NOTE — Progress Notes (Signed)
Patient Francisco Fox, male DOB:December 03, 1965, 53 y.o. BOF:[id number]  Chief Complaint  Patient presents with  . Hip Pain    R/hurting since Sunday/sharp pain feels more like a catch    HPI  Francisco Fox is a 53 y.o. male who has developed pain in the right hip since 09-05-2019.  He has no trauma.  He said the pain is deep but more laterally and superior.  He has no redness, no swelling.  He is much better now.  He could hardly walk Sunday.  He took ibuprofen and used heat.  He has no problem with weight bearing now and with motion.  He has no other joint pains.   Body mass index is 39.36 kg/m.  ROS  Review of Systems  Constitutional: Positive for activity change.  Musculoskeletal: Positive for arthralgias, gait problem and joint swelling.  All other systems reviewed and are negative.   All other systems reviewed and are negative.  The following is a summary of the past history medically, past history surgically, known current medicines, social history and family history.  This information is gathered electronically by the computer from prior information and documentation.  I review this each visit and have found including this information at this point in the chart is beneficial and informative.    Past Medical History:  Diagnosis Date  . Arthritis   . Diabetes mellitus without complication ([place])   . GERD (gastroesophageal reflux disease)     Past Surgical History:  Procedure Laterality Date  . CHOLECYSTECTOMY    . CYST EXCISION Right   . SHOULDER OPEN ROTATOR CUFF REPAIR Right 11/14/2016   Procedure: RIGHT OPEN ROTATOR CUFF REPAIR;  Surgeon: Carole Civil, MD;  Location: [facility];  Service: Orthopedics;  Laterality: Right;    No family history on file.  Social History Social History   Tobacco Use  . Smoking status: Never Smoker  . Smokeless tobacco: Never Used  Substance Use Topics  . Alcohol use: Yes    Comment: occasional  . Drug use: No    No Known  Allergies  Current Outpatient Medications  Medication Sig Dispense Refill  . atorvastatin (LIPITOR) 20 MG tablet     . fluticasone (FLONASE) 50 MCG/ACT nasal spray Place into both nostrils daily.    Marland Kitchen ipratropium (ATROVENT) 0.03 % nasal spray U 2 SPRAYS IEN TID PRN    . levocetirizine (XYZAL) 5 MG tablet Take 5 mg by mouth every evening.    Marland Kitchen losartan (COZAAR) 50 MG tablet     . metFORMIN (GLUCOPHAGE) 500 MG tablet TK 1 T PO BID    . naproxen (NAPROSYN) 500 MG tablet TAKE 1 TABLET(500 MG) BY MOUTH TWICE DAILY WITH A MEAL 60 tablet 5  . omeprazole (PRILOSEC) 40 MG capsule Take 40 mg by mouth daily.    . sitaGLIPtin (JANUVIA) 100 MG tablet Take 100 mg by mouth daily.    . tamsulosin (FLOMAX) 0.4 MG CAPS capsule Take 0.4 mg by mouth.     No current facility-administered medications for this visit.      Physical Exam  Temperature (!) 97.5 F (36.4 C), height 5\' 5"  (1.651 m), weight 236 lb 8 oz (107.3 kg).  Constitutional: overall normal hygiene, normal nutrition, well developed, normal grooming, normal body habitus. Assistive device:none  Musculoskeletal: gait and station Limp none, muscle tone and strength are normal, no tremors or atrophy is present.  .  Neurological: coordination overall normal.  Deep tendon reflex/nerve stretch intact.  Sensation  normal.  Cranial nerves II-XII intact.   Skin:   Normal overall no scars, lesions, ulcers or rashes. No psoriasis.  Psychiatric: Alert and oriented x 3.  Recent memory intact, remote memory unclear.  Normal mood and affect. Well groomed.  Good eye contact.  Cardiovascular: overall no swelling, no varicosities, no edema bilaterally, normal temperatures of the legs and arms, no clubbing, cyanosis and good capillary refill.  Lymphatic: palpation is normal.  Right hip has full ROM and no effusion noted, no redness.  NV intact.  Gait is normal.  He has some slight tenderness just below the right lateral pelvis anterior superior iliac spine.     All other systems reviewed and are negative   The patient has been educated about the nature of the problem(s) and counseled on treatment options.  The patient appeared to understand what I have discussed and is in agreement with it.  Encounter Diagnosis  Name Primary?  . Pain in right hip Yes   X-rays were done of the right hip, reported separately.  PLAN Call if any problems.  Precautions discussed.  Continue current medications. I will have him stop his NSAIDs and begin prednisone dose pack, then resume the other medicine when the prednisone is completed.  Return to clinic 2 1/2 weeks.   Electronically Signed Darreld Mclean, MD 12/3/20208:30 AM

## 2019-09-09 NOTE — Patient Instructions (Signed)
Use Aspercreme, Biofreeze or Voltaren gel over the counter 2-3 times daily make sure you rub it in well each time you use it.   Use ice or heat to your hip  Stop the ibuprofen and use the prednisone taper, for 6 days, then start the ibuprofen back the prednisone may raise your blood sugar while you are on it

## 2019-09-27 ENCOUNTER — Encounter: Payer: Self-pay | Admitting: Orthopaedic Surgery

## 2019-09-27 ENCOUNTER — Ambulatory Visit: Payer: BC Managed Care – PPO | Admitting: Orthopaedic Surgery

## 2019-09-27 ENCOUNTER — Other Ambulatory Visit: Payer: Self-pay

## 2019-09-27 VITALS — BP 182/108 | HR 97 | Ht 65.0 in | Wt 235.4 lb

## 2019-09-27 DIAGNOSIS — M25561 Pain in right knee: Secondary | ICD-10-CM

## 2019-09-27 DIAGNOSIS — G8929 Other chronic pain: Secondary | ICD-10-CM

## 2019-09-27 MED ORDER — HYDROCODONE-ACETAMINOPHEN 5-325 MG PO TABS: ORAL_TABLET | ORAL | 0 refills | Status: DC

## 2019-09-27 NOTE — Progress Notes (Signed)
PROCEDURE NOTE:  The patient request injection, verbal consent was obtained.  The right knee was prepped appropriately after time out was performed.   Sterile technique was observed and anesthesia was provided by ethyl chloride and a 20-gauge needle was used to inject the knee area.  A 16-gauge needle was then used to aspirate the knee.  Color of fluid aspirated was straw  Total cc's aspirated was 30.    Injection of 1 cc of Depo-Medrol 40 mg with several cc's of plain xylocaine was then performed.  A band aid dressing was applied.  The patient was advised to apply ice later today and tomorrow to the injection sight as needed.  Return in one month.  I have reviewed the [place] web site prior to prescribing narcotic medicine for this patient.   Electronically Signed Sanjuana Kava, MD 12/21/20202:06 PM

## 2019-10-15 ENCOUNTER — Ambulatory Visit: Payer: BC Managed Care – PPO | Attending: Internal Medicine

## 2019-10-15 ENCOUNTER — Other Ambulatory Visit: Payer: Self-pay

## 2019-10-15 DIAGNOSIS — Z20822 Contact with and (suspected) exposure to covid-19: Secondary | ICD-10-CM

## 2019-10-16 LAB — NOVEL CORONAVIRUS, NAA: SARS-CoV-2, NAA: DETECTED — AB

## 2019-10-26 ENCOUNTER — Ambulatory Visit: Payer: BC Managed Care – PPO | Admitting: Orthopaedic Surgery

## 2019-11-02 ENCOUNTER — Encounter: Payer: Self-pay | Admitting: Orthopaedic Surgery

## 2019-11-02 ENCOUNTER — Ambulatory Visit (INDEPENDENT_AMBULATORY_CARE_PROVIDER_SITE_OTHER): Payer: BC Managed Care – PPO | Admitting: Orthopaedic Surgery

## 2019-11-02 ENCOUNTER — Telehealth: Payer: Self-pay | Admitting: Orthopaedic Surgery

## 2019-11-02 ENCOUNTER — Other Ambulatory Visit: Payer: Self-pay

## 2019-11-02 VITALS — Ht 65.0 in | Wt 236.0 lb

## 2019-11-02 DIAGNOSIS — M25561 Pain in right knee: Secondary | ICD-10-CM | POA: Diagnosis not present

## 2019-11-02 DIAGNOSIS — G8929 Other chronic pain: Secondary | ICD-10-CM

## 2019-11-02 NOTE — Telephone Encounter (Signed)
Patient states he had thought he requested a refill on his medication, at time of his visit to office today, 11/02/19:  HYDROcodone-acetaminophen (NORCO/VICODIN) 5-325 MG tablet 30 tablet  -[facility] on [address], [place]

## 2019-11-02 NOTE — Progress Notes (Signed)
My knee is much better  He has no pain in the right knee after the aspiration and injection last time.  He has normal gait.  ROM is nearly full.  Encounter Diagnosis  Name Primary?  . Chronic pain of right knee Yes   I will see as needed.  Call if any problem.  Precautions discussed.   Electronically Signed Darreld Mclean, MD 1/26/20212:34 PM

## 2019-11-03 MED ORDER — HYDROCODONE-ACETAMINOPHEN 5-325 MG PO TABS: ORAL_TABLET | ORAL | 0 refills | Status: DC

## 2019-11-24 ENCOUNTER — Encounter (HOSPITAL_COMMUNITY): Payer: Self-pay | Admitting: *Deleted

## 2019-11-24 ENCOUNTER — Other Ambulatory Visit: Payer: Self-pay

## 2019-11-24 ENCOUNTER — Emergency Department (HOSPITAL_COMMUNITY)
Admission: EM | Admit: 2019-11-24 | Discharge: 2019-11-24 | Disposition: A | Discharge status: Home / Self Care | Payer: BC Managed Care – PPO | Attending: Emergency Medicine | Admitting: Emergency Medicine

## 2019-11-24 DIAGNOSIS — E119 Type 2 diabetes mellitus without complications: Secondary | ICD-10-CM | POA: Diagnosis not present

## 2019-11-24 DIAGNOSIS — Z7984 Long term (current) use of oral hypoglycemic drugs: Secondary | ICD-10-CM | POA: Insufficient documentation

## 2019-11-24 DIAGNOSIS — Z79899 Other long term (current) drug therapy: Secondary | ICD-10-CM | POA: Insufficient documentation

## 2019-11-24 DIAGNOSIS — R339 Retention of urine, unspecified: Secondary | ICD-10-CM | POA: Insufficient documentation

## 2019-11-24 DIAGNOSIS — R338 Other retention of urine: Secondary | ICD-10-CM

## 2019-11-24 LAB — URINALYSIS, ROUTINE W REFLEX MICROSCOPIC
Bilirubin Urine: NEGATIVE
Glucose, UA: >=500 mg/dL — AB
Hgb urine dipstick: NEGATIVE
Ketones, ur: NEGATIVE mg/dL
Leukocytes,Ua: NEGATIVE
Nitrite: NEGATIVE
Protein, ur: 30 mg/dL — AB
Specific Gravity, Urine: 1.022 (ref 1.005–1.030)
pH: 5 (ref 5.0–8.0)

## 2019-11-24 LAB — CBG MONITORING, ED: Glucose-Capillary: 182 mg/dL — ABNORMAL HIGH (ref 70–99)

## 2019-11-24 NOTE — ED Provider Notes (Signed)
[facility] EMERGENCY DEPARTMENT Provider Note   CSN: [id number] Arrival date & time: 11/24/19  0301   Time seen 3:11 AM  History Chief Complaint  Patient presents with  . Urinary Retention    Francisco Fox is a 54 y.o. male.  HPI   Patient states has had to come to the ED 1 year ago and 2 years ago about this time a year for urinary retention.  He states the last time he was able to void was 10 PM tonight and it was totally normal.  He states now he feels the need to urinate but he is unable to.  He denies having abdominal pain.  He denies hematuria, nausea, or vomiting.  He denies taking any cold medications.  He has a urologist but cannot recall the urologist name.  When I ask him why he felt he was having the urinary retention he said the urologist thought it was from his blood sugar being high.  He states his blood sugars have been 1 25-1 30 today.  PCP Assunta Found, MD   Past Medical History:  Diagnosis Date  . Arthritis   . Diabetes mellitus without complication ([facility])   . GERD (gastroesophageal reflux disease)     There are no problems to display for this patient.   Past Surgical History:  Procedure Laterality Date  . CHOLECYSTECTOMY    . CYST EXCISION Right   . SHOULDER OPEN ROTATOR CUFF REPAIR Right 11/14/2016   Procedure: RIGHT OPEN ROTATOR CUFF REPAIR;  Surgeon: Vickki Hearing, MD;  Location: [facility];  Service: Orthopedics;  Laterality: Right;       History reviewed. No pertinent family history.  Social History   Tobacco Use  . Smoking status: Never Smoker  . Smokeless tobacco: Never Used  Substance Use Topics  . Alcohol use: Yes    Comment: occasional  . Drug use: No  lives with spouse  Home Medications Prior to Admission medications   Medication Sig Start Date End Date Taking? Authorizing Provider  atorvastatin (LIPITOR) 20 MG tablet  03/03/19   [provider]  fluticasone (FLONASE) 50 MCG/ACT nasal spray Place into both nostrils  daily.    [provider]  HYDROcodone-acetaminophen (NORCO/VICODIN) 5-325 MG tablet One tablet every six hours for pain.  Limit 7 days. 11/03/19   Darreld Mclean, MD  ipratropium (ATROVENT) 0.03 % nasal spray U 2 SPRAYS IEN TID PRN 06/28/19   [provider]  levocetirizine (XYZAL) 5 MG tablet Take 5 mg by mouth every evening.    [provider]  losartan (COZAAR) 50 MG tablet  07/07/19   [provider]  metFORMIN (GLUCOPHAGE) 500 MG tablet TK 1 T PO BID 02/05/19   [provider]  naproxen (NAPROSYN) 500 MG tablet TAKE 1 TABLET(500 MG) BY MOUTH TWICE DAILY WITH A MEAL 08/26/19   Darreld Mclean, MD  omeprazole (PRILOSEC) 40 MG capsule Take 40 mg by mouth daily.    [provider]  predniSONE (STERAPRED UNI-PAK 21 TAB) 5 MG (21) TBPK tablet Take 6 pills first day; 5 pills second day; 4 pills third day; 3 pills fourth day; 2 pills next day and 1 pill last day. 09/09/19   Darreld Mclean, MD  sitaGLIPtin (JANUVIA) 100 MG tablet Take 100 mg by mouth daily.    [provider]  tamsulosin (FLOMAX) 0.4 MG CAPS capsule Take 0.4 mg by mouth.    [provider]    Allergies    Patient has  no known allergies.  Review of Systems   Review of Systems  All other systems reviewed and are negative.   Physical Exam Updated Vital Signs BP (!) 156/96 (BP Location: Right Arm)   Pulse (!) 106   Temp 98.9 F (37.2 C) (Oral)   Resp 18   Ht 5\' 5"  (1.651 m)   Wt 104.3 kg   SpO2 100%   BMI 38.27 kg/m   Physical Exam Vitals and nursing note reviewed.  Constitutional:      Appearance: Normal appearance. He is obese.  HENT:     Head: Normocephalic and atraumatic.     Right Ear: External ear normal.     Left Ear: External ear normal.  Eyes:     Extraocular Movements: Extraocular movements intact.     Conjunctiva/sclera: Conjunctivae normal.     Pupils: Pupils are equal, round, and reactive to light.  Cardiovascular:     Rate and  Rhythm: Normal rate.  Pulmonary:     Effort: Pulmonary effort is normal. No respiratory distress.  Abdominal:     General: There is distension.     Comments: Mildly tender, feels firm  Musculoskeletal:        General: Normal range of motion.     Cervical back: Normal range of motion.  Skin:    General: Skin is warm and dry.  Neurological:     General: No focal deficit present.     Mental Status: He is alert and oriented to person, place, and time.     Cranial Nerves: No cranial nerve deficit.  Psychiatric:        Mood and Affect: Mood normal.        Behavior: Behavior normal.        Thought Content: Thought content normal.     ED Results / Procedures / Treatments   Labs (all labs ordered are listed, but only abnormal results are displayed)  Results for orders placed or performed during the hospital encounter of 11/24/19  Urinalysis, Routine w reflex microscopic  Result Value Ref Range   Color, Urine YELLOW YELLOW   APPearance HAZY (A) CLEAR   Specific Gravity, Urine 1.022 1.005 - 1.030   pH 5.0 5.0 - 8.0   Glucose, UA >=500 (A) NEGATIVE mg/dL   Hgb urine dipstick NEGATIVE NEGATIVE   Bilirubin Urine NEGATIVE NEGATIVE   Ketones, ur NEGATIVE NEGATIVE mg/dL   Protein, ur 30 (A) NEGATIVE mg/dL   Nitrite NEGATIVE NEGATIVE   Leukocytes,Ua NEGATIVE NEGATIVE   RBC / HPF 21-50 0 - 5 RBC/hpf   WBC, UA 0-5 0 - 5 WBC/hpf   Bacteria, UA RARE (A) NONE SEEN   Squamous Epithelial / LPF 0-5 0 - 5   Mucus PRESENT    Sperm, UA PRESENT   CBG monitoring, ED  Result Value Ref Range   Glucose-Capillary 182 (H) 70 - 99 mg/dL    Laboratory interpretation all normal except glucosuria, hyperglycemia   Results for orders placed or performed in visit on 10/15/19  Novel Coronavirus, NAA ([facility])   Specimen: Nasopharyngeal(NP) swabs in vial transport medium   NASOPHARYNGE  TESTING  Result Value Ref Range   SARS-CoV-2, NAA Detected (A) Not Detected      EKG None  Radiology No  results found.  Procedures Procedures (including critical care time)  Medications Ordered in ED Medications - No data to display  ED Course  I have reviewed the triage vital signs and the nursing notes.  Pertinent labs & imaging results  that were available during my care of the patient were reviewed by me and considered in my medical decision making (see chart for details).    MDM Rules/Calculators/A&P                      Bladder scan was 290 cc.  Nurses report they were unable to pass a regular Foley and had difficulty passing a coud catheter.  Recheck at 4:00 AM patient's Foley catheter has 400 cc of urine in the bag.  We are waiting for his UA to result in his CBG to be done.  Final Clinical Impression(s) / ED Diagnoses Final diagnoses:  Acute urinary retention    Rx / DC Orders ED Discharge Orders    None     Plan discharge  Rolland Porter, MD, Barbette Or, MD 11/24/19 [phone]

## 2019-11-24 NOTE — ED Notes (Signed)
Leg bag applied prior to d/c of pt.

## 2019-11-24 NOTE — Discharge Instructions (Addendum)
Please call [facility] in the morning and get a follow-up appointment.  Return to the emergency department if the catheter stops draining urine.

## 2019-11-24 NOTE — ED Triage Notes (Signed)
Pt states he has not been able to void since 10pm last night; pt denies any pain; pt states he feels like the need to void but unable

## 2020-01-04 ENCOUNTER — Other Ambulatory Visit: Payer: Self-pay | Admitting: Orthopaedic Surgery

## 2020-01-04 MED ORDER — HYDROCODONE-ACETAMINOPHEN 5-325 MG PO TABS: ORAL_TABLET | ORAL | 0 refills | Status: DC

## 2020-02-02 ENCOUNTER — Other Ambulatory Visit: Payer: Self-pay | Admitting: Orthopaedic Surgery

## 2020-02-03 NOTE — Telephone Encounter (Signed)
Denied.  Has not been seen since January.

## 2020-02-14 ENCOUNTER — Other Ambulatory Visit: Payer: Self-pay | Admitting: Orthopaedic Surgery

## 2020-05-20 ENCOUNTER — Other Ambulatory Visit: Payer: Self-pay | Admitting: Orthopaedic Surgery

## 2020-12-06 ENCOUNTER — Telehealth: Payer: Self-pay | Admitting: Orthopaedic Surgery

## 2021-06-19 ENCOUNTER — Other Ambulatory Visit: Payer: Self-pay | Admitting: Urology

## 2021-06-27 ENCOUNTER — Other Ambulatory Visit: Payer: Self-pay

## 2021-06-27 ENCOUNTER — Encounter (HOSPITAL_BASED_OUTPATIENT_CLINIC_OR_DEPARTMENT_OTHER): Payer: Self-pay | Admitting: Urology

## 2021-06-27 NOTE — Progress Notes (Signed)
Spoke w/ via phone for pre-op interview---patient Lab needs dos---- Istat, EKG              Lab results------none COVID test -----patient states asymptomatic no test needed Arrive at -------0530 NPO after MN NO Solid Food.  Clear liquids from MN until---0430 Med rec completed Medications to take morning of surgery -----Flonase, Atrovent, Prilosec, Flomax, Lipitor Diabetic medication -----Januvia Do not take morning of surgery Patient instructed no nail polish to be worn day of surgery Patient instructed to bring photo id and insurance card day of surgery Patient aware to have Driver (ride ) / caregiver    for 24 hours after surgery  - wife Francisco Fox Patient Special Instructions -----n/a Pre-Op special Istructions -----n/a Patient verbalized understanding of instructions that were given at this phone interview. Patient denies shortness of breath, chest pain, fever, cough at this phone interview.

## 2021-06-27 NOTE — Progress Notes (Signed)
Patient stated that his doctor told him to have a sleep study done several years ago. He said that he never did.

## 2021-07-02 NOTE — H&P (Signed)
Office Visit Report     06/07/2021   --------------------------------------------------------------------------------   Francisco Fox  MRN: [id number]  DOB: 19-May-1966, 55 year old Male  SSN: [id number]   PRIMARY CARE:  Assunta Found, MD  REFERRING:  Assunta Found, MD  PROVIDER:  Jerilee Field, M.D.  LOCATION:  [facility] [phone]     --------------------------------------------------------------------------------   CC/HPI: F/u -   1) BPH, He developed urinary retention January 2019. He had a normal DRE. PSA was 0.7. He passed a voiding trial. On tamsulosin. He developed urinary retention again in February 2020. Cysto Feb 2020 with lateral lobe hypertrophy. Another episode of retention February 2021. Passed VT. Neurogenic risk includes diabetes mellitus. Voiding well on tamsulosin. Prostate ~45 grams on CT March 2020. PSA Dec 2020 1.7 and 04/22 PSA 1.4. AUASS = 0. Cotinues tamsulosin. PVR was 12 ml.   Sx dilated 10/21. He has decreased FOS 06/22. Tams increased to BID - stream improved but weak at times. Tight sx recurrent today, 09/22 on cysto.   2) Urethral sx - He c/o of a split stream. 8-10 Fr bulb sx on cysto 10/21 dilated with a 14 Fr straight cath and the scope. Recurrent sx 09/22 on cysto.   Today, patient seen for the above.     ALLERGIES: None   MEDICATIONS: Omeprazole 40 mg capsule,delayed release  Tamsulosin Hcl 0.4 mg capsule 1 capsule PO Q 12 H Please call for your follow up  Flonase Allergy Relief  Ipratropium Bromide 21 mcg (0.03 %) aerosol, spray  Janumet 50 mg-1,000 mg tablet  Levocetirizine Dihydrochloride 5 mg tablet  Naproxen  Olmesartan Medoxomil 40 mg tablet     GU PSH: Circumcision Cystoscopy - 07/13/2020, 2020 Locm 300-399Mg /Ml Iodine,1Ml - 2020       PSH Notes: lap choli   NON-GU PSH: Rotator cuff surgery     GU PMH: BPH w/LUTS - 03/30/2021, AUASS 0 today doing well but 3 episodes of retention. Discussed / recommended  UDS but he doesn't want to do it. ALso disc nature r/b of 5ari or procedures. He'd like to avoid medicine and we'll check cysto one more time and consider a procedure such as Ulift or RFWVT. Stricture not likely as he has catheter passed wasily and cysto last yr showed no sx, but he c/o split stream (decreased flow?). 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 Disc formal dilation in OR and he will consider. Disc sx recurrence. , - 07/13/2020 Straining on Urination - 03/30/2021 Gross hematuria - 2020, I do have some concerns with his self reported intermittent passage of tissue vs blood clot material. I have recommended he undergo a more formal evaluation of this with CT imaging and f/u cystoscopy., - 2020 Urinary Retention, 2nd recurrence of urinary retention despite tamsulosin use. After TOV today, he will f/u for MD evaluation with consideration for cystoscopy. - 2020, (Improving), - 2019, - 2019    NON-GU PMH: Bacteriuria, urine for cx. No dys or hematuria today. - 05/29/2020 Pyuria/other UA findings - 12/13/2019 Arthritis Diabetes Type 2 GERD    FAMILY HISTORY: Diabetes - Father, Sister, Runs in Family, Mother, Brother Hypertension - Runs in Family Kidney Failure - Brother, Mother, Father Kidney Stones - Brother   SOCIAL HISTORY: Marital Status: Married Preferred Language: English; Race: Black or African American Current Smoking Status: Patient has never smoked.   Tobacco Use Assessment Completed: Used Tobacco in last 30  days? Drinks 3 drinks per week.  Drinks 1 caffeinated drink per day. 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    REVIEW OF SYSTEMS:    GU Review Male:   Patient denies frequent urination, hard to postpone urination, burning/ pain with urination, get up at night to urinate, leakage of urine, stream starts and stops, trouble starting your stream, have to strain to urinate , erection problems, and  penile pain.  Gastrointestinal (Upper):   Patient denies nausea, vomiting, and indigestion/ heartburn.  Gastrointestinal (Lower):   Patient denies diarrhea and constipation.  Constitutional:   Patient denies fever, night sweats, weight loss, and fatigue.  Skin:   Patient denies skin rash/ lesion and itching.  Eyes:   Patient denies blurred vision and double vision.  Ears/ Nose/ Throat:   Patient denies sore throat and sinus problems.  Hematologic/Lymphatic:   Patient denies swollen glands and easy bruising.  Cardiovascular:   Patient denies leg swelling and chest pains.  Respiratory:   Patient denies cough and shortness of breath.  Endocrine:   Patient denies excessive thirst.  Musculoskeletal:   Patient denies back pain and joint pain.  Neurological:   Patient denies headaches and dizziness.  Psychologic:   Patient denies depression and anxiety.   VITAL SIGNS: None   MULTI-SYSTEM PHYSICAL EXAMINATION:    Constitutional: Well-nourished. No physical deformities. Normally developed. Good grooming.  Neck: Neck symmetrical, not swollen. Normal tracheal position.  Respiratory: No labored breathing, no use of accessory muscles.   Cardiovascular: Normal temperature, normal extremity pulses, no swelling, no varicosities.  Skin: No paleness, no jaundice, no cyanosis. No lesion, no ulcer, no rash.  Neurologic / Psychiatric: Oriented to time, oriented to place, oriented to person. No depression, no anxiety, no agitation.  Gastrointestinal: No mass, no tenderness, no rigidity, non obese abdomen.     PAST DATA REVIEW: None   PROCEDURES:         Flexible Cystoscopy - 52000  Risks, benefits, and some of the potential complications of the procedure were discussed with the patient. All questions were answered. Informed consent was obtained. Antibiotic prophylaxis was given -- Cephalexin. Sterile technique and intraurethral analgesia were used.  Meatus:  Normal size. Normal location. Normal condition.   Urethra:  Severe bulbous stricture. Could not pass scope proximal.       The lower urinary tract was carefully examined. The procedure was well-tolerated and without complications. Antibiotic instructions were given. Instructions were given to call the office immediately for bloody urine, difficulty urinating, painful urination, fever, chills, nausea, vomiting or other illness. The patient stated that he understood these instructions and would comply with them.         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 Discussed the nature r/b/a to optilume balloon dilation and he elects to proceed.    PLAN:           Schedule Return Visit/Planned Activity: Next Available Appointment - Schedule Surgery          Document Letter(s):  Created for Patient: Clinical Summary         Notes:   cc: Dr.  Phillips Odor     * Signed by Jerilee Field, M.D. on 06/07/21 at 5:59 PM (EDT)*      The information contained in this medical record document is considered private and confidential patient information. This information can only be used for the medical diagnosis and/or medical services that are being provided by the patient's selected caregivers. This information can only be distributed outside of the patient's care if the patient agrees and signs waivers of authorization for this information to be sent to an outside source or route.

## 2021-07-03 ENCOUNTER — Encounter (HOSPITAL_BASED_OUTPATIENT_CLINIC_OR_DEPARTMENT_OTHER)
Admission: RE | Disposition: A | Discharge status: Home / Self Care | Payer: Self-pay | Source: Home / Self Care | Attending: Urology

## 2021-07-03 ENCOUNTER — Other Ambulatory Visit: Payer: Self-pay

## 2021-07-03 ENCOUNTER — Ambulatory Visit (HOSPITAL_BASED_OUTPATIENT_CLINIC_OR_DEPARTMENT_OTHER): Payer: BC Managed Care – PPO | Admitting: Anesthesiology

## 2021-07-03 ENCOUNTER — Ambulatory Visit (HOSPITAL_BASED_OUTPATIENT_CLINIC_OR_DEPARTMENT_OTHER)
Admission: RE | Admit: 2021-07-03 | Discharge: 2021-07-03 | Disposition: A | Discharge status: Home / Self Care | Payer: BC Managed Care – PPO | Attending: Urology | Admitting: Urology

## 2021-07-03 ENCOUNTER — Encounter (HOSPITAL_BASED_OUTPATIENT_CLINIC_OR_DEPARTMENT_OTHER): Payer: Self-pay | Admitting: Urology

## 2021-07-03 DIAGNOSIS — Z79899 Other long term (current) drug therapy: Secondary | ICD-10-CM | POA: Diagnosis not present

## 2021-07-03 DIAGNOSIS — R338 Other retention of urine: Secondary | ICD-10-CM | POA: Diagnosis not present

## 2021-07-03 DIAGNOSIS — N401 Enlarged prostate with lower urinary tract symptoms: Secondary | ICD-10-CM | POA: Insufficient documentation

## 2021-07-03 DIAGNOSIS — N35912 Unspecified bulbous urethral stricture, male: Secondary | ICD-10-CM | POA: Diagnosis present

## 2021-07-03 DIAGNOSIS — Z791 Long term (current) use of non-steroidal anti-inflammatories (NSAID): Secondary | ICD-10-CM | POA: Diagnosis not present

## 2021-07-03 DIAGNOSIS — N35812 Other urethral bulbous stricture, male: Secondary | ICD-10-CM

## 2021-07-03 HISTORY — DX: Essential (primary) hypertension: I10

## 2021-07-03 HISTORY — PX: CYSTOSCOPY WITH URETHRAL DILATATION: SHX5125

## 2021-07-03 LAB — POCT I-STAT, CHEM 8
BUN: 19 mg/dL (ref 6–20)
Calcium, Ion: 1.29 mmol/L (ref 1.15–1.40)
Chloride: 103 mmol/L (ref 98–111)
Creatinine, Ser: 1 mg/dL (ref 0.61–1.24)
Glucose, Bld: 131 mg/dL — ABNORMAL HIGH (ref 70–99)
HCT: 39 % (ref 39.0–52.0)
Hemoglobin: 13.3 g/dL (ref 13.0–17.0)
Potassium: 3.8 mmol/L (ref 3.5–5.1)
Sodium: 140 mmol/L (ref 135–145)
TCO2: 26 mmol/L (ref 22–32)

## 2021-07-03 SURGERY — CYSTOSCOPY, WITH URETHRAL DILATION
Anesthesia: General

## 2021-07-03 MED ORDER — PHENYLEPHRINE 40 MCG/ML (10ML) SYRINGE FOR IV PUSH (FOR BLOOD PRESSURE SUPPORT)
PREFILLED_SYRINGE | INTRAVENOUS | Status: AC
  Filled 2021-07-03: qty 10

## 2021-07-03 MED ORDER — FENTANYL CITRATE (PF) 100 MCG/2ML IJ SOLN
INTRAMUSCULAR | Status: AC
  Filled 2021-07-03: qty 2

## 2021-07-03 MED ORDER — LACTATED RINGERS IV SOLN: INTRAVENOUS | Status: DC

## 2021-07-03 MED ORDER — PROMETHAZINE HCL 25 MG/ML IJ SOLN: 6.2500 mg | INTRAMUSCULAR | Status: DC | PRN

## 2021-07-03 MED ORDER — DEXAMETHASONE SODIUM PHOSPHATE 10 MG/ML IJ SOLN
INTRAMUSCULAR | Status: DC | PRN
  Administered 2021-07-03: 5 mg via INTRAVENOUS

## 2021-07-03 MED ORDER — MIDAZOLAM HCL 2 MG/2ML IJ SOLN
INTRAMUSCULAR | Status: AC
  Filled 2021-07-03: qty 2

## 2021-07-03 MED ORDER — DEXAMETHASONE SODIUM PHOSPHATE 10 MG/ML IJ SOLN
INTRAMUSCULAR | Status: AC
  Filled 2021-07-03: qty 1

## 2021-07-03 MED ORDER — PROPOFOL 10 MG/ML IV BOLUS
INTRAVENOUS | Status: AC
  Filled 2021-07-03: qty 20

## 2021-07-03 MED ORDER — MIDAZOLAM HCL 5 MG/5ML IJ SOLN
INTRAMUSCULAR | Status: DC | PRN
  Administered 2021-07-03: 2 mg via INTRAVENOUS

## 2021-07-03 MED ORDER — OXYCODONE HCL 5 MG/5ML PO SOLN: 5.0000 mg | Freq: Once | ORAL | Status: DC | PRN

## 2021-07-03 MED ORDER — LIDOCAINE 2% (20 MG/ML) 5 ML SYRINGE
INTRAMUSCULAR | Status: DC | PRN
  Administered 2021-07-03: 60 mg via INTRAVENOUS

## 2021-07-03 MED ORDER — FENTANYL CITRATE (PF) 100 MCG/2ML IJ SOLN: 25.0000 ug | INTRAMUSCULAR | Status: DC | PRN

## 2021-07-03 MED ORDER — CEFAZOLIN SODIUM-DEXTROSE 2-4 GM/100ML-% IV SOLN
2.0000 g | Freq: Once | INTRAVENOUS | Status: AC
  Administered 2021-07-03: 2 g via INTRAVENOUS

## 2021-07-03 MED ORDER — ONDANSETRON HCL 4 MG/2ML IJ SOLN
INTRAMUSCULAR | Status: DC | PRN
  Administered 2021-07-03: 4 mg via INTRAVENOUS

## 2021-07-03 MED ORDER — ONDANSETRON HCL 4 MG/2ML IJ SOLN
INTRAMUSCULAR | Status: AC
  Filled 2021-07-03: qty 2

## 2021-07-03 MED ORDER — IOHEXOL 300 MG/ML  SOLN
INTRAMUSCULAR | Status: DC | PRN
  Administered 2021-07-03 (×2): 10 mL via URETHRAL

## 2021-07-03 MED ORDER — CEPHALEXIN 500 MG PO CAPS: 500.0000 mg | ORAL_CAPSULE | Freq: Every evening | ORAL | 0 refills | Status: DC

## 2021-07-03 MED ORDER — STERILE WATER FOR IRRIGATION IR SOLN
Status: DC | PRN
  Administered 2021-07-03: 3000 mL

## 2021-07-03 MED ORDER — FENTANYL CITRATE (PF) 100 MCG/2ML IJ SOLN
INTRAMUSCULAR | Status: DC | PRN
  Administered 2021-07-03 (×2): 50 ug via INTRAVENOUS

## 2021-07-03 MED ORDER — CEFAZOLIN SODIUM-DEXTROSE 2-4 GM/100ML-% IV SOLN
INTRAVENOUS | Status: AC
  Filled 2021-07-03: qty 100

## 2021-07-03 MED ORDER — PROPOFOL 10 MG/ML IV BOLUS
INTRAVENOUS | Status: DC | PRN
  Administered 2021-07-03: 200 mg via INTRAVENOUS

## 2021-07-03 MED ORDER — LIDOCAINE HCL (PF) 2 % IJ SOLN
INTRAMUSCULAR | Status: AC
  Filled 2021-07-03: qty 5

## 2021-07-03 MED ORDER — OXYCODONE HCL 5 MG PO TABS: 5.0000 mg | ORAL_TABLET | Freq: Once | ORAL | Status: DC | PRN

## 2021-07-03 MED ORDER — PHENYLEPHRINE HCL (PRESSORS) 10 MG/ML IV SOLN
INTRAVENOUS | Status: DC | PRN
  Administered 2021-07-03 (×5): 80 ug via INTRAVENOUS

## 2021-07-03 SURGICAL SUPPLY — 27 items
BAG DRAIN URO-CYSTO SKYTR STRL (DRAIN) ×2 IMPLANT
BAG DRN RND TRDRP ANRFLXCHMBR (UROLOGICAL SUPPLIES) ×1
BAG DRN UROCATH (DRAIN) ×1
BAG URINE DRAIN 2000ML AR STRL (UROLOGICAL SUPPLIES) ×1 IMPLANT
BALLN NEPHROSTOMY (BALLOONS)
BALLN OPTILUME DCB 30X5X75 (BALLOONS) ×2
BALLOON NEPHROSTOMY (BALLOONS) IMPLANT
BALLOON OPTILUME DCB 30X5X75 (BALLOONS) IMPLANT
CATH FOLEY 2WAY SLVR  5CC 18FR (CATHETERS) ×2
CATH FOLEY 2WAY SLVR 5CC 18FR (CATHETERS) IMPLANT
CATH SET URETHRAL DILATOR (CATHETERS) ×1 IMPLANT
CLOTH BEACON ORANGE TIMEOUT ST (SAFETY) ×2 IMPLANT
ELECT REM PT RETURN 9FT ADLT (ELECTROSURGICAL)
ELECTRODE REM PT RTRN 9FT ADLT (ELECTROSURGICAL) IMPLANT
GLOVE SURG ENC MOIS LTX SZ7.5 (GLOVE) ×2 IMPLANT
GLOVE SURG ENC MOIS LTX SZ8 (GLOVE) IMPLANT
GOWN STRL REUS W/TWL LRG LVL3 (GOWN DISPOSABLE) ×2 IMPLANT
GUIDEWIRE ANG ZIPWIRE 038X150 (WIRE) IMPLANT
GUIDEWIRE STR DUAL SENSOR (WIRE) ×1 IMPLANT
GUIDEWIRE SUPER STIFF (WIRE) ×1 IMPLANT
KIT BALLIN UROMAX 15FX10 (LABEL) IMPLANT
KIT TURNOVER CYSTO (KITS) ×2 IMPLANT
MANIFOLD NEPTUNE II (INSTRUMENTS) ×2 IMPLANT
PACK CYSTO (CUSTOM PROCEDURE TRAY) ×2 IMPLANT
SET HIGH PRES BAL DIL (LABEL) ×2
SYR 10ML LL (SYRINGE) ×1 IMPLANT
TUBE CONNECTING 12X1/4 (SUCTIONS) ×1 IMPLANT

## 2021-07-03 NOTE — Anesthesia Procedure Notes (Signed)
Procedure Name: LMA Insertion Date/Time: 07/03/2021 7:45 AM Performed by: Bishop Limbo, CRNA Pre-anesthesia Checklist: Patient identified, Emergency Drugs available, Suction available and Patient being monitored Patient Re-evaluated:Patient Re-evaluated prior to induction Oxygen Delivery Method: Circle System Utilized Preoxygenation: Pre-oxygenation with 100% oxygen Induction Type: IV induction Ventilation: Mask ventilation without difficulty LMA: LMA inserted LMA Size: 5.0 Number of attempts: 1 Placement Confirmation: positive ETCO2 Tube secured with: Tape Dental Injury: Teeth and Oropharynx as per pre-operative assessment

## 2021-07-03 NOTE — Anesthesia Postprocedure Evaluation (Signed)
Anesthesia Post Note  Patient: Francisco Fox  Procedure(s) Performed: CYSTOSCOPY WITH URETHRAL DILATATION USING OPTILUME     Patient location during evaluation: PACU Anesthesia Type: General Level of consciousness: awake and alert Pain management: pain level controlled Vital Signs Assessment: post-procedure vital signs reviewed and stable Respiratory status: spontaneous breathing, nonlabored ventilation and respiratory function stable Cardiovascular status: blood pressure returned to baseline and stable Postop Assessment: no apparent nausea or vomiting Anesthetic complications: no   No notable events documented.  Last Vitals:  Vitals:   07/03/21 0915 07/03/21 0930  BP: 140/84 (!) 152/95  Pulse: 72 72  Resp: 19 12  Temp:  (!) 36.4 C  SpO2: 95% 100%    Last Pain:  Vitals:   07/03/21 0930  TempSrc:   PainSc: 0-No pain                 Beryle Lathe

## 2021-07-03 NOTE — Op Note (Signed)
Preoperative diagnosis: Bulbar urethral stricture Postoperative diagnosis: Same  Procedure: Retrograde urethrogram, OptiLube balloon dilation of urethra  Surgeon: Junious Silk  Anesthesia: General  Indication for procedure: Francisco Fox is a 55 year old male with a history of urinary retention.  On cystoscopy he had a urethral stricture dilated in office.  He is developed again a week stream with hesitancy and dribbling of urination.  Repeat office cystoscopy revealed recurrence of the stricture.  Findings: On exam under anesthesia the penis was circumcised without mass or lesion.  Testicles descended bilaterally and palpably normal and on DRE the prostate was about 30 g and smooth without hard area or nodule.  On cystoscopy there was a very tight about 6 French stricture in the bulbar urethra about 2 cm long.   Retrograde urethrogram-this outlined a 2 cm narrow bulbar stricture.  After predilation with the balloon and urethral dilators the flexible cystoscope was passed and the stricture was noted to be about 2 cm in the bulb with about a centimeter of normal urethra proximal and prior to the membranous urethra.  The prostate was short and overall nonobstructive. The bladder had no obvious mucosal lesion, stone or foreign body but urine was cloudy from prior wire passage and urethral dilation.   Description of procedure: After consent was obtained patient brought to the operating room.  After adequate anesthesia he was placed lithotomy position and prepped and draped in the usual sterile fashion.  Timeout was performed to confirm the patient and procedure.  The rigid cystoscope was passed per urethra where the stricture was encountered.  I backed the scope out and placed an 44 French Foley in the distal urethra and put 2 cc in the balloon.  Retrograde injection of contrast was performed with fluoroscopic imaging.  I then repassed the scope and passed a sensor wire through the stricture and coiled that in  the bladder under fluoroscopic guidance.  I then took the Bard urethral dilators and passed the 10 [place] which was quite tight.  I could not pass the 12 [place] and therefore I repassed the 10 [place] into the bladder and swap the sensor wire out for an Amplatz superstiff.  I then was able to pass the 12 [place] but not anything larger. Therefore, I passed UroMax 15 French balloon and ballooned this up to dilate the stricture and left that for 3 minutes, deflated and removed.  I was now able to get the 12 - 89 French straight dilators through and then was able to pass a flexible cystoscope to inspect the stricture and then proximally through the prostate and into the bladder.  I then backed the flexible cystoscope out and chose the 30 French x 5 cm OptiLume balloon.  The OptiLume balloon was passed per urethra and I let it soak for about 1 minute.  I then positioned it fluoroscopically and also passed the rigid scope adjacent to the wire and inflated the balloon under direct visual.  I slightly deflated the balloon and pulled it slightly more distal.  Balloon was inflated to 10 atm and this provided excellent stricture coverage.  The balloon was left inflated for 8 minutes. The OptiLube balloon was deflated and removed and an 58 French Foley catheter was advanced, balloon inflated and seated at the bladder neck.  Urine was clear.  The Super Stiff wire was removed.  Exam under anesthesia was completed.  Patient was awakened taken the cover him in stable condition.  Complications: None  Blood loss: Minimal  Specimens: None  Drains:  76 French Foley catheter  Disposition: Patient stable to PACU

## 2021-07-03 NOTE — Interval H&P Note (Signed)
History and Physical Interval Note:  07/03/2021 7:29 AM  Francisco Fox  has presented today for surgery, with the diagnosis of URETHRAL STRICTURE.  The various methods of treatment have been discussed with the patient and family. After consideration of risks, benefits and other options for treatment, the patient has consented to  Procedure(s): CYSTOSCOPY WITH URETHRAL DILATATION USING OPTILUME (N/A) as a surgical intervention.  The patient's history has been reviewed, patient examined, no change in status, stable for surgery.  I have reviewed the patient's chart and labs. We discussed risk of sx recurrence, and incontinence among others as well as condom/effective contraception and foley post-op. Also discussed his voiding issues may persists if BPH/BOO is an issue and we may need to deal with that as well  - meds, procedures, etc.  Questions were answered to the patient's satisfaction.  He elects to proceed.    Jerilee Field

## 2021-07-03 NOTE — Anesthesia Preprocedure Evaluation (Addendum)
Anesthesia Evaluation  Patient identified by MRN, date of birth, ID band Patient awake    Reviewed: Allergy & Precautions, NPO status , Patient's Chart, lab work & pertinent test results  History of Anesthesia Complications Negative for: history of anesthetic complications  Airway Mallampati: III  TM Distance: >3 FB Neck ROM: Full    Dental  (+) Dental Advisory Given, Teeth Intact   Pulmonary neg pulmonary ROS,    Pulmonary exam normal        Cardiovascular hypertension, Pt. on medications Normal cardiovascular exam     Neuro/Psych negative neurological ROS  negative psych ROS   GI/Hepatic Neg liver ROS, GERD  Medicated and Controlled,  Endo/Other  diabetes, Type 2, Oral Hypoglycemic Agents Obesity   Renal/GU negative Renal ROS     Musculoskeletal  (+) Arthritis ,   Abdominal   Peds  Hematology negative hematology ROS (+)   Anesthesia Other Findings   Reproductive/Obstetrics                            Anesthesia Physical Anesthesia Plan  ASA: 2  Anesthesia Plan: General   Post-op Pain Management:    Induction: Intravenous  PONV Risk Score and Plan: 3 and Treatment may vary due to age or medical condition, Ondansetron, Dexamethasone and Midazolam  Airway Management Planned: LMA  Additional Equipment: None  Intra-op Plan:   Post-operative Plan: Extubation in OR  Informed Consent: I have reviewed the patients History and Physical, chart, labs and discussed the procedure including the risks, benefits and alternatives for the proposed anesthesia with the patient or authorized representative who has indicated his/her understanding and acceptance.     Dental advisory given  Plan Discussed with: CRNA and Anesthesiologist  Anesthesia Plan Comments:        Anesthesia Quick Evaluation

## 2021-07-03 NOTE — Discharge Instructions (Addendum)
Urethral Dilation Urethral dilation is a procedure to stretch open (dilate) the urethra. The urethra is the tube that drains urine from the bladder out of the body. In women, the urethra opens above the vaginal opening. In men, the urethra opens at the tip of the penis. Urethral dilation is usually done to treat narrowing of the urethra (urethral stricture), which can make it difficult to pass urine. Urethral dilation widens the urethra so that you can pass urine normally. Urethral dilation is done through the urethral opening. There are no incisions made during the procedure. What can I expect after the procedure? After the procedure, it is common to have: Burning pain when urinating. Blood in your urine. A need to urinate frequently. You will have a foley catheter - see instructions After the foley catheter removed, use a condom for 30 days during sex and an effective contraception for 6 months if your partner is in childbearing years  Follow these instructions at home: Medicines Take over-the-counter and prescription medicines only as told by your health care provider. If you were prescribed an antibiotic medicine, take it as told by your health care provider. Do not stop taking the antibiotic even if you start to feel better. Ask your health care provider if the medicine prescribed to you: Requires you to avoid driving or using heavy machinery. Can cause constipation. You may need to take these actions to prevent or treat constipation: Take over-the-counter or prescription medicines. Eat foods that are high in fiber, such as beans, whole grains, and fresh fruits and vegetables. Limit foods that are high in fat and processed sugars, such as fried or sweet foods. General instructions Do not drive for 24 hours if you were given a sedative during your procedure. If you were sent home with a small, lubricated tube (catheter) to help keep your urethra open, follow your health care provider's  instructions about how and when to use it. Drink enough fluid to keep your urine pale yellow. Return to your normal activities as told by your health care provider. Ask your health care provider what activities are safe for you. Keep all follow-up visits as told by your health care provider. This is important. Contact a health care provider if: Your urine is cloudy and smells bad. You develop new bleeding when you urinate. You pass blood clots when you urinate. You have pain that does not get better with medicine. You have a fever. You have swelling, bruising, or discoloration of your genital area. This includes the penis, scrotum, and inner thighs for men, and the outer genital organs (vulva) and inner thighs for women. Get help right away if: You develop new bleeding that does not stop. You cannot pass urine. Summary Urethral dilation is a procedure to stretch open (dilate) the urethra. Urethral dilation is usually done to treat narrowing of the urethra (urethral stricture), which can make it difficult to pass urine. Ask your health care provider about changing or stopping your regular medicines before the procedure. After the procedure, it is common to have burning pain when urinating, blood in your urine, and a need to urinate frequently. This information is not intended to replace advice given to you by your health care provider. Make sure you discuss any questions you have with your health care provider. Post Anesthesia Home Care Instructions  Activity: Get plenty of rest for the remainder of the day. A responsible adult should stay with you for 24 hours following the procedure.  For the next 24  hours, DO NOT: -Drive a car -Advertising copywriter -Drink alcoholic beverages -Take any medication unless instructed by your physician -Make any legal decisions or sign important papers.  Meals: Start with liquid foods such as gelatin or soup. Progress to regular foods as tolerated. Avoid  greasy, spicy, heavy foods. If nausea and/or vomiting occur, drink only clear liquids until the nausea and/or vomiting subsides. Call your physician if vomiting continues.  Special Instructions/Symptoms: Your throat may feel dry or sore from the anesthesia or the breathing tube placed in your throat during surgery. If this causes discomfort, gargle with warm salt water. The discomfort should disappear within 24 hours.  If you had a scopolamine patch placed behind your ear for the management of post- operative nausea and/or vomiting:  1. The medication in the patch is effective for 72 hours, after which it should be removed.  Wrap patch in a tissue and discard in the trash. Wash hands thoroughly with soap and water. 2. You may remove the patch earlier than 72 hours if you experience unpleasant side effects which may include dry mouth, dizziness or visual disturbances. 3. Avoid touching the patch. Wash your hands with soap and water after contact with the patch.    Post Anesthesia Home Care Instructions  Activity: Get plenty of rest for the remainder of the day. A responsible adult should stay with you for 24 hours following the procedure.  For the next 24 hours, DO NOT: -Drive a car -Advertising copywriter -Drink alcoholic beverages -Take any medication unless instructed by your physician -Make any legal decisions or sign important papers.  Meals: Start with liquid foods such as gelatin or soup. Progress to regular foods as tolerated. Avoid greasy, spicy, heavy foods. If nausea and/or vomiting occur, drink only clear liquids until the nausea and/or vomiting subsides. Call your physician if vomiting continues.  Special Instructions/Symptoms: Your throat may feel dry or sore from the anesthesia or the breathing tube placed in your throat during surgery. If this causes discomfort, gargle with warm salt water. The discomfort should disappear within 24 hours.  If you had a scopolamine patch placed  behind your ear for the management of post- operative nausea and/or vomiting:  1. The medication in the patch is effective for 72 hours, after which it should be removed.  Wrap patch in a tissue and discard in the trash. Wash hands thoroughly with soap and water. 2. You may remove the patch earlier than 72 hours if you experience unpleasant side effects which may include dry mouth, dizziness or visual disturbances. 3. Avoid touching the patch. Wash your hands with soap and water after contact with the patch.

## 2021-07-03 NOTE — Transfer of Care (Signed)
Immediate Anesthesia Transfer of Care Note  Patient: Francisco Fox  Procedure(s) Performed: CYSTOSCOPY WITH URETHRAL DILATATION USING OPTILUME  Patient Location: PACU  Anesthesia Type:General  Level of Consciousness: awake, alert , oriented and patient cooperative  Airway & Oxygen Therapy: Patient Spontanous Breathing  Post-op Assessment: Report given to RN and Post -op Vital signs reviewed and stable  Post vital signs: Reviewed and stable  Last Vitals:  Vitals Value Taken Time  BP 147/87 07/03/21 0856  Temp    Pulse 83 07/03/21 0858  Resp 19 07/03/21 0858  SpO2 93 % 07/03/21 0858  Vitals shown include unvalidated device data.  Last Pain:  Vitals:   07/03/21 0605  TempSrc: Oral  PainSc: 0-No pain      Patients Stated Pain Goal: 5 (07/03/21 0940)  Complications: No notable events documented.

## 2021-07-04 ENCOUNTER — Encounter (HOSPITAL_BASED_OUTPATIENT_CLINIC_OR_DEPARTMENT_OTHER): Payer: Self-pay | Admitting: Urology

## 2021-08-01 ENCOUNTER — Other Ambulatory Visit: Payer: Self-pay | Admitting: Orthopaedic Surgery

## 2022-03-12 ENCOUNTER — Telehealth: Payer: Self-pay | Admitting: Orthopedic Surgery

## 2022-09-08 ENCOUNTER — Telehealth: Payer: Self-pay | Admitting: Orthopaedic Surgery

## 2022-12-05 ENCOUNTER — Encounter: Payer: Self-pay | Admitting: Radiology

## 2023-03-08 ENCOUNTER — Telehealth: Payer: Self-pay | Admitting: Orthopaedic Surgery

## 2023-09-04 ENCOUNTER — Telehealth: Payer: Self-pay | Admitting: Orthopaedic Surgery

## 2024-03-04 ENCOUNTER — Telehealth: Payer: Self-pay | Admitting: Orthopaedic Surgery

## 2024-05-06 ENCOUNTER — Telehealth (HOSPITAL_COMMUNITY): Payer: Self-pay | Admitting: *Deleted

## 2024-05-06 NOTE — Telephone Encounter (Signed)
 Attempted to schedule OP MBS (swallow test). Left VM at [phone] [facility])

## 2024-05-07 ENCOUNTER — Other Ambulatory Visit (HOSPITAL_COMMUNITY): Payer: Self-pay

## 2024-05-07 DIAGNOSIS — R1310 Dysphagia, unspecified: Secondary | ICD-10-CM

## 2024-05-25 ENCOUNTER — Ambulatory Visit (HOSPITAL_COMMUNITY)
Admission: RE | Admit: 2024-05-25 | Discharge: 2024-05-25 | Disposition: A | Discharge status: Home / Self Care | Payer: Self-pay | Source: Ambulatory Visit | Attending: *Deleted | Admitting: *Deleted

## 2024-05-25 DIAGNOSIS — R1310 Dysphagia, unspecified: Secondary | ICD-10-CM | POA: Diagnosis not present

## 2024-05-25 DIAGNOSIS — R1312 Dysphagia, oropharyngeal phase: Secondary | ICD-10-CM | POA: Diagnosis present

## 2024-05-28 ENCOUNTER — Encounter: Payer: Self-pay | Admitting: Radiology

## 2024-08-09 ENCOUNTER — Encounter: Payer: Self-pay | Admitting: Radiology

## 2024-08-23 ENCOUNTER — Encounter (HOSPITAL_COMMUNITY): Payer: Self-pay | Admitting: Emergency Medicine

## 2024-08-23 ENCOUNTER — Emergency Department (HOSPITAL_COMMUNITY)
Admission: EM | Admit: 2024-08-23 | Discharge: 2024-08-23 | Disposition: A | Discharge status: Home / Self Care | Attending: Emergency Medicine | Admitting: Emergency Medicine

## 2024-08-23 ENCOUNTER — Emergency Department (HOSPITAL_COMMUNITY)

## 2024-08-23 ENCOUNTER — Other Ambulatory Visit: Payer: Self-pay

## 2024-08-23 DIAGNOSIS — M25422 Effusion, left elbow: Secondary | ICD-10-CM

## 2024-08-23 DIAGNOSIS — Z79899 Other long term (current) drug therapy: Secondary | ICD-10-CM | POA: Insufficient documentation

## 2024-08-23 DIAGNOSIS — M25522 Pain in left elbow: Secondary | ICD-10-CM | POA: Insufficient documentation

## 2024-08-23 MED ORDER — HYDROCODONE-ACETAMINOPHEN 5-325 MG PO TABS: 1.0000 | ORAL_TABLET | Freq: Four times a day (QID) | ORAL | 0 refills | Status: DC | PRN

## 2024-08-23 MED ORDER — PREDNISONE 10 MG (21) PO TBPK: ORAL_TABLET | Freq: Every day | ORAL | 0 refills | Status: DC

## 2024-08-23 MED ORDER — PREDNISONE 50 MG PO TABS
60.0000 mg | ORAL_TABLET | Freq: Once | ORAL | Status: AC
  Administered 2024-08-23: 60 mg via ORAL
  Filled 2024-08-23: qty 1

## 2024-08-23 NOTE — Discharge Instructions (Signed)
 Begin taking prednisone  as prescribed.  Begin taking hydrocodone  as prescribed as needed for pain.  Rest.  Follow-up with orthopedics if symptoms are not improving in the next few days.  The contact information for Dr. Margrette has been provided in this discharge summary for you to call and make these arrangements.

## 2024-08-23 NOTE — ED Triage Notes (Signed)
 Pt from home c/o left arm pain and swelling  since 3 PM today. Reduced range of motion due to pain and weak grip due to pain. Noticeable swelling to elbow. Pain unrelieved by IBU or tylenol . Kellogg RN

## 2024-08-23 NOTE — ED Provider Notes (Signed)
 [place] EMERGENCY DEPARTMENT AT [facility] Provider Note   CSN: [id number] Arrival date & time: 08/23/24  9891     Patient presents with: Arm Pain   Francisco Fox is a 58 y.o. male.   Patient is a 58 year old male presenting with complaints of left elbow pain.  Symptoms began earlier today in the absence of any specific injury or trauma.  He does do repetitive motions with his job at [employer].  He describes pain to his left elbow when he attempts to move it.  No weakness or numbness.       Prior to Admission medications   Medication Sig Start Date End Date Taking? Authorizing Provider  atorvastatin (LIPITOR) 20 MG tablet 20 mg in the morning and at bedtime. 03/03/19   [provider]  cephALEXin  (KEFLEX ) 500 MG capsule Take 1 capsule (500 mg total) by mouth at bedtime. 07/03/21   Nieves Cough, MD  fluticasone (FLONASE) 50 MCG/ACT nasal spray Place 1 spray into both nostrils daily.    [provider]  HYDROcodone -acetaminophen  (NORCO/VICODIN) 5-325 MG tablet One tablet every six hours for pain.  Limit 7 days. Patient not taking: Reported on 06/27/2021 01/04/20   Brenna Lin, MD  ipratropium (ATROVENT) 0.03 % nasal spray Place 1 spray into both nostrils daily. 06/28/19   [provider]  levocetirizine (XYZAL) 5 MG tablet Take 5 mg by mouth every evening.    [provider]  losartan (COZAAR) 50 MG tablet 50 mg daily. 07/07/19   [provider]  naproxen  (NAPROSYN ) 500 MG tablet TAKE 1 TABLET(500 MG) BY MOUTH TWICE DAILY WITH A MEAL 03/04/24   Brenna Lin, MD  omeprazole (PRILOSEC) 40 MG capsule Take 40 mg by mouth in the morning and at bedtime.    [provider]  sitaGLIPtin (JANUVIA) 100 MG tablet Take 100 mg by mouth 2 (two) times daily.    [provider]  tamsulosin (FLOMAX) 0.4 MG CAPS capsule Take 0.4 mg by mouth in the morning and at bedtime.    [provider]     Allergies: Patient has no known allergies.    Review of Systems  All other systems reviewed and are negative.   Updated Vital Signs BP 138/88   Pulse (!) 103   Temp 98.5 F (36.9 C) (Oral)   Resp 18   Ht 5' 5 (1.651 m)   SpO2 96%   BMI 37.96 kg/m   Physical Exam Vitals and nursing note reviewed.  Constitutional:      Appearance: Normal appearance.  Pulmonary:     Effort: Pulmonary effort is normal.  Musculoskeletal:     Comments: There is no warmth or erythema of the skin overlying the elbow.  He does have some pain with range of motion.  Ulnar and radial pulses are palpable and motor and sensation are intact throughout the entire hand.  Skin:    General: Skin is warm and dry.  Neurological:     Mental Status: He is alert and oriented to person, place, and time.     (all labs ordered are listed, but only abnormal results are displayed) Labs Reviewed - No data to display  EKG: None  Radiology: DG Elbow Complete Left Result Date: 08/23/2024 EXAM: 3 VIEW(S) XRAY OF THE LEFT ELBOW COMPARISON: None available. CLINICAL HISTORY: elbow swelling FINDINGS: BONES AND JOINTS: Normal alignment. No definite fracture. Joint spaces are preserved. Elbow effusion is present. The presence of an elbow effusion suggests  a radial occult fracture, and dedicated CT imaging or short-term follow-up radiographs would be helpful to further evaluate this finding. SOFT TISSUES: The soft tissues are unremarkable. IMPRESSION: 1. No definite fracture identified. 2. Left elbow effusion, raising concern for an occult fracture. Dedicated CT imaging or short-term follow-up radiographs would be helpful to further evaluate this finding. Electronically signed by: Dorethia Molt MD 08/23/2024 02:15 AM EST RP Workstation: [id number]     Procedures   Medications Ordered in the ED  predniSONE  (DELTASONE ) tablet 60 mg (has no administration in time range)                                    Medical  Decision Making  Patient presenting here with left elbow pain that began in the absence of any injury or trauma.  His x-ray shows an elbow effusion, but no obvious fracture.  CT was recommended to rule out fracture, however patient does not describe any trauma and I believe this to be low yield.  I will place patient in an arm sling and treat with prednisone  and pain medication.  He is to follow-up with orthopedics if not improving in the next few days.  I have considered gout as well as a septic joint, but neither fits the clinical picture.  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     Final diagnoses:  None    ED Discharge Orders     None          Geroldine Berg, MD 08/23/24 [phone]

## 2024-08-24 ENCOUNTER — Telehealth: Payer: Self-pay | Admitting: Orthopedic Surgery

## 2024-08-24 NOTE — Telephone Encounter (Signed)
 Dr. Areatha pt - spoke w/the pt, he was seen at [facility] and x-rays 08/22/24 for his left elbow.  He stated fluid on his elbow.  Is it ok for me to schedule in the same day at 10:30am Friday, 08/27/24, it's the only opening you currently have other than 4:45pm Thursday.  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 [phone]

## 2024-08-27 ENCOUNTER — Encounter: Payer: Self-pay | Admitting: Orthopedic Surgery

## 2024-08-27 ENCOUNTER — Ambulatory Visit: Admitting: Orthopedic Surgery

## 2024-08-27 VITALS — BP 133/84 | Ht 65.0 in | Wt 228.0 lb

## 2024-08-27 DIAGNOSIS — Z91030 Bee allergy status: Secondary | ICD-10-CM | POA: Diagnosis not present

## 2024-08-27 DIAGNOSIS — M25422 Effusion, left elbow: Secondary | ICD-10-CM | POA: Diagnosis not present

## 2024-08-27 MED ORDER — PREDNISONE 10 MG (48) PO TBPK: ORAL_TABLET | Freq: Every day | ORAL | 0 refills | Status: DC

## 2024-08-27 MED ORDER — HYDROCODONE-ACETAMINOPHEN 5-325 MG PO TABS: 1.0000 | ORAL_TABLET | Freq: Four times a day (QID) | ORAL | 0 refills | Status: DC | PRN

## 2024-08-27 NOTE — Patient Instructions (Addendum)
 OOW note thru nxt office visit   Apply warm compresses to forearm and elbow 20 min 3 x a day

## 2024-08-27 NOTE — Progress Notes (Signed)
  Intake history:  Chief Complaint  Patient presents with   Elbow Pain    Left 08/23/24      BP 133/84   Ht 5' 5 (1.651 m)   Wt 228 lb (103.4 kg)   BMI 37.94 kg/m  Body mass index is 37.94 kg/m.  Pharmacy? __[facility]____________________________________  WHAT ARE WE SEEING YOU FOR TODAY?   Left elbow   How long has this bothered you? (DOI?DOS?WS?)  ER 08/23/24   Was there an injury? Bee Sting   Anticoag.  No   Any ALLERGIES _______________No Known Allergies  _______________________________   Treatment:  Have you taken:  Tylenol    Advil    Had PT   Had injection   Other  ________prednisone pain meds _________________

## 2024-08-27 NOTE — Progress Notes (Signed)
 Office Visit Note last seen February 03, 2020  New problem   Patient: Francisco Fox           Date of Birth: [birth year]           MRN: [id number] Visit Date: 08/27/2024 Requested by: [name], MD [address] [place],  [place] [address] PCP: [name], MD   Assessment & Plan:   Encounter Diagnoses  Name Primary?   Bee sting allergy Yes   Elbow effusion, left     Meds ordered this encounter  Medications   predniSONE  (STERAPRED UNI-PAK 48 TAB) 10 MG (48) TBPK tablet    Sig: Take by mouth daily.    Dispense:  48 tablet    Refill:  0   HYDROcodone -acetaminophen  (NORCO/VICODIN) 5-325 MG tablet    Sig: Take 1 tablet by mouth every 6 (six) hours as needed.    Dispense:  20 tablet    Refill:  0   Recommend  Warm compression packs 3 times a day for 20 minutes  Pain medication  Out of work note  Follow-up in 2 weeks  Prednisone  12-day taper   Subjective: Chief Complaint  Patient presents with   Elbow Pain    Left 08/23/24     HPI: The patient reports that he was stung by a bee about 2 weeks ago and had some swelling in his hand it progressively worsened and it migrated into his forearm and left elbow requiring an ER visit  On November 17 at which time he was given a 6-day steroid taper pain medication and was taken out of work  He is still having left elbow pain swelling forearm pain as well decreased range of motion he cannot extend his elbow completely  His x-ray showed joint effusion but he has no trauma and he has no arthritis on the x-ray                ROS: He has had a bee sting in the past and he did have some swelling he said after this bee sting he developed flu symptoms   Images personally read and my interpretation : Elbow x-ray posterior fat pad elevation no evidence of arthritis  Visit Diagnoses:  1. Bee sting allergy   2. Elbow effusion, left      Follow-Up Instructions: Return in about 2 weeks (around 09/10/2024) for FOLLOW UP,  LEFT, ELBOW.    Objective: Vital Signs: BP 133/84   Ht 5' 5 (1.651 m)   Wt 228 lb (103.4 kg)   BMI 37.94 kg/m   Physical Exam  Normal development grooming and hygiene awake alert and oriented x 3 mood and affect normal Ortho Exam Bee sting wound is on his hand.  He has normal range of motion wrist and hand pain in his forearm primarily in the compartment of the ECRB ECRL and brachial radialis  Lack of elbow extension 30 degrees flexion only to 90 degrees pain and tenderness in the forearm and over the elbow joint  Specialty Comments:  No specialty comments available.  Imaging: No results found.   PMFS History: There are no active problems to display for this patient.  Past Medical History:  Diagnosis Date   Arthritis    Diabetes mellitus without complication (HCC)    GERD (gastroesophageal reflux disease)    HTN (hypertension)     History reviewed. No pertinent family history.  Past Surgical History:  Procedure Laterality Date   CHOLECYSTECTOMY  Patient states > 10 yrs ago on 06/27/21   CYST EXCISION Right    jaw   CYSTOSCOPY WITH URETHRAL DILATATION N/A 07/03/2021   Procedure: CYSTOSCOPY WITH URETHRAL DILATATION USING OPTILUME;  Surgeon: Nieves Cough, MD;  Location: [facility];  Service: Urology;  Laterality: N/A;   SHOULDER OPEN ROTATOR CUFF REPAIR Right 11/14/2016   Procedure: RIGHT OPEN ROTATOR CUFF REPAIR;  Surgeon: Taft FORBES Minerva, MD;  Location: [facility];  Service: Orthopedics;  Laterality: Right;   Social History   Occupational History   Not on file  Tobacco Use   Smoking status: Never   Smokeless tobacco: Never  Vaping Use   Vaping status: Never Used  Substance and Sexual Activity   Alcohol use: Yes    Comment: occasional   Drug use: No   Sexual activity: Yes    Birth control/protection: None       Meds ordered this encounter  Medications   predniSONE  (STERAPRED UNI-PAK 48 TAB) 10 MG (48) TBPK tablet    Sig: Take  by mouth daily.    Dispense:  48 tablet    Refill:  0   HYDROcodone -acetaminophen  (NORCO/VICODIN) 5-325 MG tablet    Sig: Take 1 tablet by mouth every 6 (six) hours as needed.    Dispense:  20 tablet    Refill:  0

## 2024-08-30 ENCOUNTER — Ambulatory Visit: Payer: Self-pay

## 2024-08-30 ENCOUNTER — Telehealth: Payer: Self-pay | Admitting: Orthopedic Surgery

## 2024-08-30 NOTE — Telephone Encounter (Signed)
 Pt does not have a [facility] PCP. Rn attempted to call ortho for patient. LVM for them to call pt back.   Copied from CRM [id number]. Topic: Clinical - Red Word Triage >> Aug 30, 2024  1:51 PM Mia F wrote: Red Word that prompted transfer to Nurse Triage: Pt says he was seen last week for elbow pain. He says he was given medication for it but it is not helping at all and it is also getting worse. He says he also has a runny nose and he feels he is loosing his voice a little. He says the dr told him this is all coming from a bee sting. Reason for Disposition  [1] Painful joint AND [2] no fever  Answer Assessment - Initial Assessment Questions Pt states that he was seen in the Er and then saw Dr. Margrette for this. They think this is from a bee sting. He states that Dr. Margrette gave him pain meds and prednisone  and the pain is worse. Pain is 8.5/10 and gets worse at night. He also states he began to have a runny nose.  He states yesterday he noticed his skin was starting to peel. But it is ok today. He states there is swelling. He states he has also been doing the heat like recommended and it has not helped. He states he feels hot at times but has not checked his temperature. Denies all other higher acuity questions.  As Rn was looking through chart and going to schedule, he said he wanted to schedule with Dr. Margrette. RN looked at note again and realized Dr. Margrette is an orthopedic doctor. RN asked who PCP is, He stated Dr. Marvine, who is not longer with [facility] and he sees him still currently. RN advised he needed to contact Dr. Areatha office. Rn offered to transfer call. Rn called orthopedic office and got their VM. LVM for them to call patient to discuss. Informed pt RN left VM for them to call him back. Pt stated understanding.    1. LOCATION: Where is the swelling? (e.g., left, right, both elbows)     Left elbow 2. SIZE and DESCRIPTION: What does the swelling look like? (e.g.,  entire elbow, localized)     Swelling about the same 3. ONSET: When did the swelling start? Does it come and go, or is it there all the time?     Weeks ago 4. WORK OR EXERCISE: Has there been any recent work, exercise or other activity that involved that part of the body?      no 5. AGGRAVATING FACTORS: What makes the elbow swelling worse? (e.g., work, sports activities)     Worse at night when trying to sleep  6. ASSOCIATED SYMPTOMS: Is there any pain or redness?     pain 7. OTHER SYMPTOMS: Do you have any other symptoms? (e.g., fever)     denies  Protocols used: Elbow Swelling-A-AH

## 2024-08-30 NOTE — Telephone Encounter (Signed)
 Dr. Areatha pt - spoke w/the pt, he stated that he saw Dr. VEAR on Friday and that he told him the swelling was coming from a bee sting.  He stated that the Hydrocodone  isn't helping and he thinks the swelling is worse.  Please advise, do I need to schedule him to come in tomorrow or Wednesday?  He uses [facility] on [address].  [phone]

## 2024-08-31 ENCOUNTER — Other Ambulatory Visit: Payer: Self-pay

## 2024-08-31 ENCOUNTER — Encounter (HOSPITAL_COMMUNITY): Payer: Self-pay | Admitting: *Deleted

## 2024-08-31 ENCOUNTER — Emergency Department (HOSPITAL_COMMUNITY)

## 2024-08-31 ENCOUNTER — Inpatient Hospital Stay (HOSPITAL_COMMUNITY)
Admission: EM | Admit: 2024-08-31 | Discharge: 2024-09-04 | DRG: 872 | Disposition: A | Discharge status: Home / Self Care | Attending: Internal Medicine | Admitting: Internal Medicine

## 2024-08-31 DIAGNOSIS — N179 Acute kidney failure, unspecified: Secondary | ICD-10-CM | POA: Diagnosis not present

## 2024-08-31 DIAGNOSIS — K219 Gastro-esophageal reflux disease without esophagitis: Secondary | ICD-10-CM | POA: Diagnosis present

## 2024-08-31 DIAGNOSIS — I10 Essential (primary) hypertension: Secondary | ICD-10-CM | POA: Diagnosis present

## 2024-08-31 DIAGNOSIS — N400 Enlarged prostate without lower urinary tract symptoms: Secondary | ICD-10-CM | POA: Diagnosis present

## 2024-08-31 DIAGNOSIS — E869 Volume depletion, unspecified: Secondary | ICD-10-CM | POA: Diagnosis present

## 2024-08-31 DIAGNOSIS — E1169 Type 2 diabetes mellitus with other specified complication: Secondary | ICD-10-CM | POA: Diagnosis present

## 2024-08-31 DIAGNOSIS — E872 Acidosis, unspecified: Secondary | ICD-10-CM | POA: Diagnosis present

## 2024-08-31 DIAGNOSIS — Z79899 Other long term (current) drug therapy: Secondary | ICD-10-CM

## 2024-08-31 DIAGNOSIS — L03114 Cellulitis of left upper limb: Secondary | ICD-10-CM | POA: Diagnosis not present

## 2024-08-31 DIAGNOSIS — R6520 Severe sepsis without septic shock: Secondary | ICD-10-CM | POA: Diagnosis present

## 2024-08-31 DIAGNOSIS — Z7952 Long term (current) use of systemic steroids: Secondary | ICD-10-CM

## 2024-08-31 DIAGNOSIS — Z6834 Body mass index (BMI) 34.0-34.9, adult: Secondary | ICD-10-CM

## 2024-08-31 DIAGNOSIS — B951 Streptococcus, group B, as the cause of diseases classified elsewhere: Secondary | ICD-10-CM | POA: Diagnosis not present

## 2024-08-31 DIAGNOSIS — E7849 Other hyperlipidemia: Secondary | ICD-10-CM | POA: Diagnosis present

## 2024-08-31 DIAGNOSIS — L02414 Cutaneous abscess of left upper limb: Secondary | ICD-10-CM | POA: Diagnosis present

## 2024-08-31 DIAGNOSIS — T63441S Toxic effect of venom of bees, accidental (unintentional), sequela: Secondary | ICD-10-CM

## 2024-08-31 DIAGNOSIS — E785 Hyperlipidemia, unspecified: Secondary | ICD-10-CM

## 2024-08-31 DIAGNOSIS — A419 Sepsis, unspecified organism: Principal | ICD-10-CM | POA: Diagnosis present

## 2024-08-31 DIAGNOSIS — E1165 Type 2 diabetes mellitus with hyperglycemia: Secondary | ICD-10-CM | POA: Diagnosis present

## 2024-08-31 DIAGNOSIS — Z7984 Long term (current) use of oral hypoglycemic drugs: Secondary | ICD-10-CM

## 2024-08-31 DIAGNOSIS — E66811 Obesity, class 1: Secondary | ICD-10-CM | POA: Diagnosis present

## 2024-08-31 LAB — CBC WITH DIFFERENTIAL/PLATELET
Abs Immature Granulocytes: 0.04 K/uL (ref 0.00–0.07)
Basophils Absolute: 0 K/uL (ref 0.0–0.1)
Basophils Relative: 0 %
Eosinophils Absolute: 0 K/uL (ref 0.0–0.5)
Eosinophils Relative: 0 %
HCT: 38.5 % — ABNORMAL LOW (ref 39.0–52.0)
Hemoglobin: 12.7 g/dL — ABNORMAL LOW (ref 13.0–17.0)
Immature Granulocytes: 0 %
Lymphocytes Relative: 9 %
Lymphs Abs: 1 K/uL (ref 0.7–4.0)
MCH: 28.3 pg (ref 26.0–34.0)
MCHC: 33 g/dL (ref 30.0–36.0)
MCV: 85.9 fL (ref 80.0–100.0)
Monocytes Absolute: 1.1 K/uL — ABNORMAL HIGH (ref 0.1–1.0)
Monocytes Relative: 9 %
Neutro Abs: 9.4 K/uL — ABNORMAL HIGH (ref 1.7–7.7)
Neutrophils Relative %: 82 %
Platelets: 445 K/uL — ABNORMAL HIGH (ref 150–400)
RBC: 4.48 MIL/uL (ref 4.22–5.81)
RDW: 12.5 % (ref 11.5–15.5)
WBC: 11.6 K/uL — ABNORMAL HIGH (ref 4.0–10.5)
nRBC: 0 % (ref 0.0–0.2)

## 2024-08-31 LAB — LACTIC ACID, PLASMA
Lactic Acid, Venous: 3.1 mmol/L (ref 0.5–1.9)
Lactic Acid, Venous: 3.2 mmol/L (ref 0.5–1.9)

## 2024-08-31 LAB — COMPREHENSIVE METABOLIC PANEL WITH GFR
ALT: 9 U/L (ref 0–44)
AST: <10 U/L — ABNORMAL LOW (ref 15–41)
Albumin: 4.1 g/dL (ref 3.5–5.0)
Alkaline Phosphatase: 78 U/L (ref 38–126)
Anion gap: 14 (ref 5–15)
BUN: 33 mg/dL — ABNORMAL HIGH (ref 6–20)
CO2: 22 mmol/L (ref 22–32)
Calcium: 9.8 mg/dL (ref 8.9–10.3)
Chloride: 91 mmol/L — ABNORMAL LOW (ref 98–111)
Creatinine, Ser: 1.12 mg/dL (ref 0.61–1.24)
GFR, Estimated: >60 mL/min (ref >60)
Glucose, Bld: 371 mg/dL — ABNORMAL HIGH (ref 70–99)
Potassium: 4.6 mmol/L (ref 3.5–5.1)
Sodium: 127 mmol/L — ABNORMAL LOW (ref 135–145)
Total Bilirubin: 0.3 mg/dL (ref 0.0–1.2)
Total Protein: 8.2 g/dL — ABNORMAL HIGH (ref 6.5–8.1)

## 2024-08-31 LAB — SEDIMENTATION RATE: Sed Rate: 69 mm/h — ABNORMAL HIGH (ref 0–20)

## 2024-08-31 LAB — GLUCOSE, CAPILLARY: Glucose-Capillary: 409 mg/dL — ABNORMAL HIGH (ref 70–99)

## 2024-08-31 LAB — C-REACTIVE PROTEIN: CRP: 15.5 mg/dL — ABNORMAL HIGH (ref <1.0)

## 2024-08-31 LAB — CBG MONITORING, ED
Glucose-Capillary: 366 mg/dL — ABNORMAL HIGH (ref 70–99)
Glucose-Capillary: 282 mg/dL — ABNORMAL HIGH (ref 70–99)

## 2024-08-31 MED ORDER — INSULIN ASPART 100 UNIT/ML IJ SOLN: 0.0000 [IU] | Freq: Three times a day (TID) | INTRAMUSCULAR | Status: DC

## 2024-08-31 MED ORDER — INSULIN GLARGINE-YFGN 100 UNIT/ML ~~LOC~~ SOLN: 5.0000 [IU] | Freq: Two times a day (BID) | SUBCUTANEOUS | Status: DC

## 2024-08-31 MED ORDER — ENOXAPARIN SODIUM 40 MG/0.4ML IJ SOSY
40.0000 mg | PREFILLED_SYRINGE | INTRAMUSCULAR | Status: DC
  Administered 2024-08-31 – 2024-09-03 (×4): 40 mg via SUBCUTANEOUS
  Filled 2024-08-31 (×4): qty 0.4

## 2024-08-31 MED ORDER — ONDANSETRON HCL 4 MG/2ML IJ SOLN
4.0000 mg | Freq: Four times a day (QID) | INTRAMUSCULAR | Status: DC | PRN
  Administered 2024-09-01: 4 mg via INTRAVENOUS

## 2024-08-31 MED ORDER — LEVOCETIRIZINE DIHYDROCHLORIDE 5 MG PO TABS: 5.0000 mg | ORAL_TABLET | Freq: Every evening | ORAL | Status: DC

## 2024-08-31 MED ORDER — LORATADINE 10 MG PO TABS
10.0000 mg | ORAL_TABLET | Freq: Every day | ORAL | Status: DC
  Administered 2024-09-01 – 2024-09-04 (×4): 10 mg via ORAL
  Filled 2024-08-31 (×4): qty 1

## 2024-08-31 MED ORDER — IPRATROPIUM BROMIDE 0.03 % NA SOLN: 1.0000 | Freq: Every day | NASAL | Status: DC

## 2024-08-31 MED ORDER — TAMSULOSIN HCL 0.4 MG PO CAPS
0.4000 mg | ORAL_CAPSULE | Freq: Every day | ORAL | Status: DC
  Administered 2024-09-01 – 2024-09-03 (×3): 0.4 mg via ORAL
  Filled 2024-08-31 (×3): qty 1

## 2024-08-31 MED ORDER — ROSUVASTATIN CALCIUM 10 MG PO TABS
10.0000 mg | ORAL_TABLET | Freq: Every day | ORAL | Status: DC
  Administered 2024-09-01 – 2024-09-03 (×3): 10 mg via ORAL
  Filled 2024-08-31 (×3): qty 1

## 2024-08-31 MED ORDER — ACETAMINOPHEN 650 MG RE SUPP: 650.0000 mg | Freq: Four times a day (QID) | RECTAL | Status: DC | PRN

## 2024-08-31 MED ORDER — INSULIN GLARGINE-YFGN 100 UNIT/ML ~~LOC~~ SOLN
10.0000 [IU] | Freq: Every day | SUBCUTANEOUS | Status: DC
  Administered 2024-08-31 – 2024-09-01 (×2): 10 [IU] via SUBCUTANEOUS
  Filled 2024-08-31 (×3): qty 0.1

## 2024-08-31 MED ORDER — SODIUM CHLORIDE 0.9 % IV SOLN
2.0000 g | Freq: Once | INTRAVENOUS | Status: AC
  Administered 2024-08-31: 2 g via INTRAVENOUS
  Filled 2024-08-31: qty 20

## 2024-08-31 MED ORDER — SODIUM CHLORIDE 0.9 % IV SOLN
1.0000 g | INTRAVENOUS | Status: DC
  Administered 2024-09-01 – 2024-09-03 (×3): 1 g via INTRAVENOUS
  Filled 2024-08-31 (×3): qty 10

## 2024-08-31 MED ORDER — INSULIN ASPART 100 UNIT/ML IJ SOLN
0.0000 [IU] | Freq: Three times a day (TID) | INTRAMUSCULAR | Status: DC
  Administered 2024-09-01: 11 [IU] via SUBCUTANEOUS
  Administered 2024-09-01 – 2024-09-02 (×2): 7 [IU] via SUBCUTANEOUS
  Administered 2024-09-02: 4 [IU] via SUBCUTANEOUS
  Administered 2024-09-02: 7 [IU] via SUBCUTANEOUS
  Administered 2024-09-03 (×2): 4 [IU] via SUBCUTANEOUS
  Administered 2024-09-03: 3 [IU] via SUBCUTANEOUS
  Administered 2024-09-04: 12 [IU] via SUBCUTANEOUS
  Filled 2024-08-31 (×9): qty 1

## 2024-08-31 MED ORDER — AMLODIPINE BESYLATE 5 MG PO TABS: 5.0000 mg | ORAL_TABLET | Freq: Every day | ORAL | Status: DC

## 2024-08-31 MED ORDER — SODIUM CHLORIDE 0.9 % IV BOLUS: 1000.0000 mL | Freq: Once | INTRAVENOUS | Status: DC

## 2024-08-31 MED ORDER — AMLODIPINE BESYLATE 5 MG PO TABS
5.0000 mg | ORAL_TABLET | Freq: Every day | ORAL | Status: DC
  Administered 2024-08-31 – 2024-09-04 (×5): 5 mg via ORAL
  Filled 2024-08-31 (×5): qty 1

## 2024-08-31 MED ORDER — INSULIN ASPART 100 UNIT/ML IJ SOLN
5.0000 [IU] | Freq: Once | INTRAMUSCULAR | Status: AC
  Administered 2024-08-31: 5 [IU] via INTRAVENOUS
  Filled 2024-08-31: qty 1

## 2024-08-31 MED ORDER — ONDANSETRON HCL 4 MG PO TABS: 4.0000 mg | ORAL_TABLET | Freq: Four times a day (QID) | ORAL | Status: DC | PRN

## 2024-08-31 MED ORDER — VANCOMYCIN HCL IN DEXTROSE 1-5 GM/200ML-% IV SOLN: 1000.0000 mg | Freq: Once | INTRAVENOUS | Status: DC

## 2024-08-31 MED ORDER — OXYCODONE HCL 5 MG PO TABS
5.0000 mg | ORAL_TABLET | ORAL | Status: DC | PRN
Start: 2024-08-31 — End: 2024-09-04
  Administered 2024-08-31 – 2024-09-04 (×12): 5 mg via ORAL
  Filled 2024-08-31 (×12): qty 1

## 2024-08-31 MED ORDER — ACETAMINOPHEN 325 MG PO TABS
650.0000 mg | ORAL_TABLET | Freq: Four times a day (QID) | ORAL | Status: DC | PRN
  Administered 2024-09-01: 650 mg via ORAL
  Filled 2024-08-31 (×2): qty 2

## 2024-08-31 MED ORDER — VANCOMYCIN HCL 1500 MG/300ML IV SOLN
1500.0000 mg | INTRAVENOUS | Status: DC
  Administered 2024-09-01 – 2024-09-02 (×2): 1500 mg via INTRAVENOUS
  Filled 2024-08-31 (×2): qty 300

## 2024-08-31 MED ORDER — INSULIN ASPART 100 UNIT/ML IJ SOLN
0.0000 [IU] | Freq: Every day | INTRAMUSCULAR | Status: DC
  Administered 2024-08-31: 5 [IU] via SUBCUTANEOUS
  Administered 2024-09-01 – 2024-09-02 (×2): 2 [IU] via SUBCUTANEOUS
  Filled 2024-08-31 (×3): qty 1

## 2024-08-31 MED ORDER — SODIUM CHLORIDE 0.9 % IV BOLUS
1000.0000 mL | Freq: Once | INTRAVENOUS | Status: AC
  Administered 2024-08-31: 1000 mL via INTRAVENOUS

## 2024-08-31 MED ORDER — FLUTICASONE PROPIONATE 50 MCG/ACT NA SUSP
1.0000 | Freq: Every day | NASAL | Status: DC
  Administered 2024-09-01 – 2024-09-04 (×4): 1 via NASAL
  Filled 2024-08-31: qty 16

## 2024-08-31 MED ORDER — VANCOMYCIN HCL 2000 MG/400ML IV SOLN
2000.0000 mg | Freq: Once | INTRAVENOUS | Status: AC
  Administered 2024-08-31: 2000 mg via INTRAVENOUS
  Filled 2024-08-31: qty 400

## 2024-08-31 MED ORDER — INSULIN ASPART 100 UNIT/ML IJ SOLN: 5.0000 [IU] | Freq: Three times a day (TID) | INTRAMUSCULAR | Status: DC

## 2024-08-31 MED ORDER — SODIUM CHLORIDE 0.9 % IV SOLN: 1.0000 g | INTRAVENOUS | Status: DC

## 2024-08-31 MED ORDER — INSULIN GLARGINE-YFGN 100 UNIT/ML ~~LOC~~ SOLN
10.0000 [IU] | Freq: Two times a day (BID) | SUBCUTANEOUS | Status: DC
  Filled 2024-08-31 (×2): qty 0.1

## 2024-08-31 MED ORDER — INSULIN ASPART 100 UNIT/ML IJ SOLN
5.0000 [IU] | Freq: Three times a day (TID) | INTRAMUSCULAR | Status: DC
  Administered 2024-08-31 – 2024-09-02 (×5): 5 [IU] via SUBCUTANEOUS
  Filled 2024-08-31 (×6): qty 1

## 2024-08-31 MED ORDER — ATORVASTATIN CALCIUM 20 MG PO TABS: 20.0000 mg | ORAL_TABLET | Freq: Every day | ORAL | Status: DC

## 2024-08-31 MED ORDER — ROSUVASTATIN CALCIUM 10 MG PO TABS: 10.0000 mg | ORAL_TABLET | Freq: Every day | ORAL | Status: DC

## 2024-08-31 MED ORDER — LOSARTAN POTASSIUM 50 MG PO TABS
50.0000 mg | ORAL_TABLET | Freq: Every day | ORAL | Status: DC
  Administered 2024-09-01 – 2024-09-02 (×2): 50 mg via ORAL
  Filled 2024-08-31 (×2): qty 1

## 2024-08-31 NOTE — Consult Note (Signed)
 Pharmacy Antibiotic Note  Francisco Fox is a 58 y.o. male admitted on 08/31/2024 with sepsis 2/2 cellulitis of the left wrist area .  Pharmacy has been consulted for vancomycin  dosing.  Height: 5' 5 (165.1 cm) Weight: 95.1 kg (209 lb 9.6 oz) IBW/kg (Calculated) : 61.5  Plan:  Vancomycin  2000 mg LD given at 1641 on 11/25 Vancomycin  1500 mg IV Q 24 hrs ordered to start at 11/26 on 1700 . Goal AUC 400-550. Obtain levels at steady state or as clinically indicated.   Expected AUC: 542 SCr used: 1.12  Weight used for dosing: IBW Cmin: 11.3 Will monitor serum creatinine daily while on vanc. Follow renal function and cultures for adjustments     Height: 5' 5 (165.1 cm) Weight: 95.1 kg (209 lb 9.6 oz) IBW/kg (Calculated) : 61.5  Temp (24hrs), Avg:98.2 F (36.8 C), Min:98 F (36.7 C), Max:98.4 F (36.9 C)  Recent Labs  Lab 08/31/24 0956 08/31/24 1215  WBC 11.6*  --   CREATININE 1.12  --   LATICACIDVEN 3.1* 3.2*    Estimated Creatinine Clearance: 78.9 mL/min (by C-G formula based on SCr of 1.12 mg/dL).    No Known Allergies  Antimicrobials this admission: 11/25 vancomycin  >>  11/25 rocephin  >>   Microbiology results: 11/25 BCx: pending   Thank you for allowing pharmacy to be a part of this patient's care.  Annabella LOISE Banks, PharmD Clinical Pharmacist 08/31/2024 7:05 PM

## 2024-08-31 NOTE — ED Provider Notes (Signed)
 [place] EMERGENCY DEPARTMENT AT [facility] Provider Note   CSN: [id number] Arrival date & time: 08/31/24  9096     Patient presents with: Joint Swelling   THUAN TIPPETT is a 58 y.o. male.   Patient is a 58 year old male who presents to the emergency department with a chief complaint of ongoing pain, swelling to his left elbow and forearm.  Patient was evaluated in the emergency department approximately 2 weeks ago secondary to similar complaint and was placed on a taper of steroids at that point.  Patient was evaluated by orthopedics approximately 4 days ago and was placed on additional taper steroids as well as hydrocodone .  He notes he continues to have ongoing worsening swelling and pain.  He notes that the pain is worse with any attempted movement.  He notes that he has had no noted fevers at home but notes that he has felt hot at night.  Patient notes that he has been monitoring his blood sugar and this has been well-controlled.        Prior to Admission medications   Medication Sig Start Date End Date Taking? Authorizing Provider  amLODipine  (NORVASC ) 5 MG tablet Take 5 mg by mouth daily. 06/21/24   [provider]  atorvastatin  (LIPITOR) 20 MG tablet 20 mg in the morning and at bedtime. 03/03/19   [provider]  celecoxib  (CELEBREX ) 200 MG capsule Take 200 mg by mouth 2 (two) times daily as needed. 08/10/24   [provider]  cephALEXin  (KEFLEX ) 500 MG capsule Take 1 capsule (500 mg total) by mouth at bedtime. 07/03/21   Nieves Cough, MD  fluticasone  (FLONASE ) 50 MCG/ACT nasal spray Place 1 spray into both nostrils daily.    [provider]  HYDROcodone -acetaminophen  (NORCO/VICODIN) 5-325 MG tablet Take 1 tablet by mouth every 6 (six) hours as needed. 08/27/24   Harrison, Stanley E, MD  ipratropium (ATROVENT ) 0.03 % nasal spray Place 1 spray into both nostrils daily. 06/28/19   [provider]  JANUMET 50-1000 MG  tablet Take 1 tablet by mouth 2 (two) times daily. 07/12/24   [provider]  levocetirizine (XYZAL ) 5 MG tablet Take 5 mg by mouth every evening.    [provider]  losartan  (COZAAR ) 50 MG tablet 50 mg daily. 07/07/19   [provider]  MOUNJARO 5 MG/0.5ML Pen SMARTSIG:5 Milligram(s) Once a Week 08/15/24   [provider]  naproxen  (NAPROSYN ) 500 MG tablet TAKE 1 TABLET(500 MG) BY MOUTH TWICE DAILY WITH A MEAL 03/04/24   Brenna Lin, MD  omeprazole (PRILOSEC) 40 MG capsule Take 40 mg by mouth in the morning and at bedtime.    [provider]  predniSONE  (STERAPRED UNI-PAK 48 TAB) 10 MG (48) TBPK tablet Take by mouth daily. 08/27/24   Margrette Taft BRAVO, MD  rosuvastatin  (CRESTOR ) 10 MG tablet Take 10 mg by mouth at bedtime. 06/20/24   [provider]  sitaGLIPtin (JANUVIA) 100 MG tablet Take 100 mg by mouth 2 (two) times daily.    [provider]  tamsulosin  (FLOMAX ) 0.4 MG CAPS capsule Take 0.4 mg by mouth in the morning and at bedtime.    [provider]    Allergies: Patient has no known allergies.    Review of Systems  Musculoskeletal:        Pain, swelling to left elbow  All other systems reviewed and are negative.   Updated Vital Signs BP (!) 163/98 (BP Location: Right Arm)   Pulse ROLLEN)  110   Temp 98 F (36.7 C) (Oral)   Resp 20   Ht 5' 5 (1.651 m)   Wt 103.4 kg   SpO2 98%   BMI 37.93 kg/m   Physical Exam Vitals and nursing note reviewed.  Constitutional:      General: He is not in acute distress.    Appearance: Normal appearance. He is not ill-appearing.  HENT:     Head: Normocephalic and atraumatic.     Nose: Nose normal.     Mouth/Throat:     Mouth: Mucous membranes are moist.  Eyes:     Extraocular Movements: Extraocular movements intact.     Conjunctiva/sclera: Conjunctivae normal.     Pupils: Pupils are equal, round, and reactive to light.  Cardiovascular:     Rate and Rhythm: Normal  rate and regular rhythm.     Pulses: Normal pulses.     Heart sounds: Normal heart sounds. No murmur heard.    No gallop.  Pulmonary:     Effort: Pulmonary effort is normal. No respiratory distress.     Breath sounds: Normal breath sounds. No stridor. No wheezing, rhonchi or rales.  Musculoskeletal:        General: Normal range of motion.     Cervical back: Normal range of motion and neck supple.     Comments: Tender to palpation noted over the left elbow diffusely, moderate edema noted over the left elbow and proximal aspect of the left forearm, DP and PT pulses 2+ distally, sensation intact distally, radial, ulnar, median nerve function intact distally, mild overlying erythema, nontender palpation of the left wrist, hand or shoulder, no obvious deformity or bruising, no skin breakdown or ulceration, no lacerations or abrasions  Skin:    General: Skin is warm and dry.  Neurological:     General: No focal deficit present.     Mental Status: He is alert and oriented to person, place, and time. Mental status is at baseline.  Psychiatric:        Mood and Affect: Mood normal.        Behavior: Behavior normal.        Thought Content: Thought content normal.        Judgment: Judgment normal.     (all labs ordered are listed, but only abnormal results are displayed) Labs Reviewed  CULTURE, BLOOD (ROUTINE X 2)  CULTURE, BLOOD (ROUTINE X 2)  COMPREHENSIVE METABOLIC PANEL WITH GFR  CBC WITH DIFFERENTIAL/PLATELET  LACTIC ACID, PLASMA  LACTIC ACID, PLASMA  SEDIMENTATION RATE  C-REACTIVE PROTEIN    EKG: None  Radiology: No results found.   Procedures   Medications Ordered in the ED - No data to display                                  Medical Decision Making Amount and/or Complexity of Data Reviewed Labs: ordered. Radiology: ordered.  Risk Prescription drug management. Decision regarding hospitalization.   This patient presents to the ED for concern of swelling, pain to  left elbow and forearm, this involves an extensive number of treatment options, and is a complaint that carries with it a high risk of complications and morbidity.  The differential diagnosis includes cellulitis, abscess, necrotizing fasciitis, septic joint, gout, inflammatory reaction, arthritis   Co morbidities that complicate the patient evaluation  Diabetes   Additional history obtained:  Additional history obtained from medical records External records from outside  source obtained and reviewed including medical records   Lab Tests:  I Ordered, and personally interpreted labs.  The pertinent results include: Mild leukocytosis, anemia at baseline, hyperglycemia, pseudohyponatremia, elevated lactic acid, elevated sed rate   Imaging Studies ordered:  I ordered imaging studies including x-ray of left elbow I independently visualized and interpreted imaging which showed edema with no underlying fracture or effusion I agree with the radiologist interpretation    Consultations Obtained:  I requested consultation with the hospitalist,  and discussed lab and imaging findings as well as pertinent plan - they recommend: Admission   Problem List / ED Course / Critical interventions / Medication management  Patient is doing well at this time and does remain stable.  Discussed with patient we will plan for admission to the hospital service given his apparent cellulitis and borderline sepsis at this point.  He has not been febrile in the emergency department but has been have subjective fevers at home.  Patient was evaluated by attending physician who did bedside ultrasound which demonstrated cobblestoning within the left forearm with no indication for obvious drainable abscess or large fluid collection within the elbow.  Suspect cellulitis at this point.  Will avoid arthrocentesis to avoid introducing infection into the joint space.  Have discussed patient case with Dr. Arrien with the  hospitalist service who has excepted for admission. I ordered medication including IV fluids, insulin , vancomycin , Rocephin  for cellulitis, hyperglycemia Reevaluation of the patient after these medicines showed that the patient improved I have reviewed the patients home medicines and have made adjustments as needed   Social Determinants of Health:  None   Test / Admission - Considered:  Admission     Final diagnoses:  None    ED Discharge Orders     None          Daralene Lonni BIRCH, PA-C 08/31/24 1257    Elnor Jayson LABOR, DO 09/02/24 [phone]

## 2024-08-31 NOTE — ED Triage Notes (Signed)
 Pt states he was stung by a bee to the left hand x 3 weeks ago and is still having pain and swelling that has now moved to his elbow  Pt has been following up with Dr. Margrette and taking pain medication and steroids with no relief  Pt has significant swelling to left elbow with limited mobility

## 2024-08-31 NOTE — Assessment & Plan Note (Signed)
 Calculated BMI 34.8

## 2024-08-31 NOTE — H&P (Addendum)
 History and Physical    Patient: Francisco Fox FMW:[id number] DOB: [birth year] DOA: 08/31/2024 DOS: the patient was seen and examined on 08/31/2024 PCP: [name], MD  Patient coming from: Home  Chief Complaint:  Chief Complaint  Patient presents with   Joint Swelling   HPI: Francisco Fox is a 59 y.o. male with medical history significant of T2DM, hypertension and GERD who presented with left arm pain and edema.   11/17 he was diagnosed with been sting (left wrist area) related inflammatory cellulitis. He was seen in the ED and prescribed prednisone .  11/21 follow up with orthopedics and recommended to continue prednisone .  Unfortunately his symptoms continue to worsen with expanding edema to his entire forearm, local worsening edema, induration and pain.   He is able to move his wrist and elbow joint on his left upper extremity.   No fevers or chills, no nausea or vomiting.   At the time of my examination his pain is moderate to severe in intensity, worse to touch, with no radiation, no improving factors, no associated chills.,   Review of Systems: As mentioned in the history of present illness. All other systems reviewed and are negative. Past Medical History:  Diagnosis Date   Arthritis    Diabetes mellitus without complication (HCC)    GERD (gastroesophageal reflux disease)    HTN (hypertension)    Past Surgical History:  Procedure Laterality Date   CHOLECYSTECTOMY     Patient states > 10 yrs ago on 06/27/21   CYST EXCISION Right    jaw   CYSTOSCOPY WITH URETHRAL DILATATION N/A 07/03/2021   Procedure: CYSTOSCOPY WITH URETHRAL DILATATION USING OPTILUME;  Surgeon: Nieves Cough, MD;  Location: [facility];  Service: Urology;  Laterality: N/A;   SHOULDER OPEN ROTATOR CUFF REPAIR Right 11/14/2016   Procedure: RIGHT OPEN ROTATOR CUFF REPAIR;  Surgeon: Taft FORBES Minerva, MD;  Location: [facility];  Service: Orthopedics;  Laterality: Right;   Social  History:  reports that he has never smoked. He has never used smokeless tobacco. He reports current alcohol use. He reports that he does not use drugs.  No Known Allergies  History reviewed. No pertinent family history.  Prior to Admission medications   Medication Sig Start Date End Date Taking? Authorizing Provider  amLODipine  (NORVASC ) 5 MG tablet Take 5 mg by mouth daily. 06/21/24   [provider]  atorvastatin  (LIPITOR) 20 MG tablet 20 mg in the morning and at bedtime. 03/03/19   [provider]  celecoxib  (CELEBREX ) 200 MG capsule Take 200 mg by mouth 2 (two) times daily as needed. 08/10/24   [provider]  cephALEXin  (KEFLEX ) 500 MG capsule Take 1 capsule (500 mg total) by mouth at bedtime. 07/03/21   Nieves Cough, MD  fluticasone  (FLONASE ) 50 MCG/ACT nasal spray Place 1 spray into both nostrils daily.    [provider]  HYDROcodone -acetaminophen  (NORCO/VICODIN) 5-325 MG tablet Take 1 tablet by mouth every 6 (six) hours as needed. 08/27/24   Harrison, Stanley E, MD  ipratropium (ATROVENT ) 0.03 % nasal spray Place 1 spray into both nostrils daily. 06/28/19   [provider]  JANUMET 50-1000 MG tablet Take 1 tablet by mouth 2 (two) times daily. 07/12/24   [provider]  levocetirizine (XYZAL ) 5 MG tablet Take 5 mg by mouth every evening.    [provider]  losartan  (COZAAR ) 50 MG tablet 50 mg daily. 07/07/19   [provider]  MOUNJARO 5 MG/0.5ML Pen SMARTSIG:5  Milligram(s) Once a Week 08/15/24   [provider]  naproxen  (NAPROSYN ) 500 MG tablet TAKE 1 TABLET(500 MG) BY MOUTH TWICE DAILY WITH A MEAL 03/04/24   Brenna Lin, MD  omeprazole (PRILOSEC) 40 MG capsule Take 40 mg by mouth in the morning and at bedtime.    [provider]  predniSONE  (STERAPRED UNI-PAK 48 TAB) 10 MG (48) TBPK tablet Take by mouth daily. 08/27/24   Margrette Taft BRAVO, MD  rosuvastatin  (CRESTOR ) 10 MG tablet Take 10 mg  by mouth at bedtime. 06/20/24   [provider]  sitaGLIPtin (JANUVIA) 100 MG tablet Take 100 mg by mouth 2 (two) times daily.    [provider]  tamsulosin  (FLOMAX ) 0.4 MG CAPS capsule Take 0.4 mg by mouth in the morning and at bedtime.    [provider]    Physical Exam: Vitals:   08/31/24 0923 08/31/24 0925  BP: (!) 163/98   Pulse: (!) 110   Resp: 20   Temp: 98 F (36.7 C)   TempSrc: Oral   SpO2: 98%   Weight:  103.4 kg  Height:  5' 5 (1.651 m)   BP (!) 147/87   Pulse (!) 105   Temp 98.3 F (36.8 C) (Oral)   Resp 20   Ht 5' 5 (1.651 m)   Wt 95.1 kg   SpO2 97%   BMI 34.88 kg/m   Neurology awake and alert ENT with no pallor or icterus Cardiovascular with S1 and S2 present and regular with no gallops, rubs or murmurs Respiratory with no rales or wheezing, no rhonchi  Abdomen with no distention  No lower extremity edema Left forearm with edema, tender to palpation, indurated, with increased local temperature, able to move left wrist and left elbow with no significant pain.  Data Reviewed:   Na 127, K 4.6 Cl 91, bicarbonate 22, glucose 371, bun 33 cr 1,1  AST < 10 ALT 9  CRP 15.5  Lactic acid 3,1 and 3,2  Wbc 11.6 hgb 12.7 plt 445   Left elbow radiograph with interval development of soft tissue swelling along the dorsum of the elbow and forearm.    Assessment and Plan: * Cellulitis of forearm, left Severe sepsis present on admission, positive lactic acid elevation.   Patient had IV fluids in the ED.   Plan to continue antibiotic therapy with IV vancomycin  and IV ceftriaxone  Follow up on cell count, cultures and temperature curve.  Pain control with acetaminophen  and oxycodone .  If no clinical improving in 24 hrs consider doing CT left upper extremity,.  Follow up lactic acid and Ck in am.   Essential hypertension Resume blood pressure control with losartan  and amlodipine .   Type 2 diabetes mellitus with hyperlipidemia  (HCC) Uncontrolled hyperglycemia due to infection and recent steroids.   Plan to add insulin  sliding scale for glucose cover and monitoring  Calculate insulin  requirement before adding basal insulin    Continue with statin   BPH (benign prostatic hyperplasia) No signs of urinary retention  Continue with tamsulosin    Obesity, class 1 Calculated BMI 34.8   Advance Care Planning:   Code Status: Full Code   Consults: none   Family Communication: no family at the bedside.   Severity of Illness: The appropriate patient status for this patient is INPATIENT. Inpatient status is judged to be reasonable and necessary in order to provide the required intensity of service to ensure the patient's safety. The patient's presenting symptoms, physical exam findings, and initial radiographic and  laboratory data in the context of their chronic comorbidities is felt to place them at high risk for further clinical deterioration. Furthermore, it is not anticipated that the patient will be medically stable for discharge from the hospital within 2 midnights of admission.   * I certify that at the point of admission it is my clinical judgment that the patient will require inpatient hospital care spanning beyond 2 midnights from the point of admission due to high intensity of service, high risk for further deterioration and high frequency of surveillance required.*  Author: Elidia Toribio Furnace, MD 08/31/2024 12:19 PM  For on call review [email or web address].

## 2024-08-31 NOTE — Assessment & Plan Note (Signed)
 Severe sepsis present on admission, positive lactic acid elevation.   Patient had IV fluids in the ED.   Plan to continue antibiotic therapy with IV vancomycin  and IV ceftriaxone  Follow up on cell count, cultures and temperature curve.  Pain control with acetaminophen  and oxycodone .  If no clinical improving in 24 hrs consider doing CT left upper extremity,.  Follow up lactic acid and Ck in am.

## 2024-08-31 NOTE — Assessment & Plan Note (Signed)
 Resume blood pressure control with losartan  and amlodipine .

## 2024-08-31 NOTE — Assessment & Plan Note (Signed)
No signs of urinary retention.  Continue with tamsulosin.

## 2024-08-31 NOTE — Assessment & Plan Note (Signed)
 Uncontrolled hyperglycemia due to infection and recent steroids.   Plan to add insulin  sliding scale for glucose cover and monitoring  Calculate insulin  requirement before adding basal insulin    Continue with statin

## 2024-08-31 NOTE — Telephone Encounter (Signed)
 Called the pt and lvm advising

## 2024-09-01 ENCOUNTER — Encounter (HOSPITAL_COMMUNITY): Payer: Self-pay | Admitting: Internal Medicine

## 2024-09-01 ENCOUNTER — Encounter (HOSPITAL_COMMUNITY)
Admission: EM | Disposition: A | Discharge status: Home / Self Care | Payer: Self-pay | Source: Home / Self Care | Attending: Internal Medicine

## 2024-09-01 ENCOUNTER — Inpatient Hospital Stay (HOSPITAL_COMMUNITY)

## 2024-09-01 ENCOUNTER — Inpatient Hospital Stay (HOSPITAL_COMMUNITY): Admitting: Anesthesiology

## 2024-09-01 DIAGNOSIS — B951 Streptococcus, group B, as the cause of diseases classified elsewhere: Secondary | ICD-10-CM

## 2024-09-01 DIAGNOSIS — E119 Type 2 diabetes mellitus without complications: Secondary | ICD-10-CM

## 2024-09-01 DIAGNOSIS — E785 Hyperlipidemia, unspecified: Secondary | ICD-10-CM

## 2024-09-01 DIAGNOSIS — E1165 Type 2 diabetes mellitus with hyperglycemia: Secondary | ICD-10-CM | POA: Insufficient documentation

## 2024-09-01 DIAGNOSIS — L02414 Cutaneous abscess of left upper limb: Secondary | ICD-10-CM | POA: Diagnosis not present

## 2024-09-01 DIAGNOSIS — L03114 Cellulitis of left upper limb: Secondary | ICD-10-CM | POA: Diagnosis not present

## 2024-09-01 DIAGNOSIS — I10 Essential (primary) hypertension: Secondary | ICD-10-CM | POA: Diagnosis not present

## 2024-09-01 DIAGNOSIS — E1169 Type 2 diabetes mellitus with other specified complication: Secondary | ICD-10-CM | POA: Diagnosis not present

## 2024-09-01 HISTORY — PX: IRRIGATION AND DEBRIDEMENT ELBOW: SHX6886

## 2024-09-01 LAB — GLUCOSE, CAPILLARY
Glucose-Capillary: 266 mg/dL — ABNORMAL HIGH (ref 70–99)
Glucose-Capillary: 208 mg/dL — ABNORMAL HIGH (ref 70–99)
Glucose-Capillary: 197 mg/dL — ABNORMAL HIGH (ref 70–99)
Glucose-Capillary: 235 mg/dL — ABNORMAL HIGH (ref 70–99)
Glucose-Capillary: 230 mg/dL — ABNORMAL HIGH (ref 70–99)

## 2024-09-01 LAB — CBC
HCT: 35.9 % — ABNORMAL LOW (ref 39.0–52.0)
Hemoglobin: 11.8 g/dL — ABNORMAL LOW (ref 13.0–17.0)
MCH: 28.3 pg (ref 26.0–34.0)
MCHC: 32.9 g/dL (ref 30.0–36.0)
MCV: 86.1 fL (ref 80.0–100.0)
Platelets: 409 K/uL — ABNORMAL HIGH (ref 150–400)
RBC: 4.17 MIL/uL — ABNORMAL LOW (ref 4.22–5.81)
RDW: 12.4 % (ref 11.5–15.5)
WBC: 12.6 K/uL — ABNORMAL HIGH (ref 4.0–10.5)
nRBC: 0 % (ref 0.0–0.2)

## 2024-09-01 LAB — BASIC METABOLIC PANEL WITH GFR
Anion gap: 11 (ref 5–15)
BUN: 27 mg/dL — ABNORMAL HIGH (ref 6–20)
CO2: 25 mmol/L (ref 22–32)
Calcium: 9.3 mg/dL (ref 8.9–10.3)
Chloride: 94 mmol/L — ABNORMAL LOW (ref 98–111)
Creatinine, Ser: 1.05 mg/dL (ref 0.61–1.24)
GFR, Estimated: >60 mL/min (ref >60)
Glucose, Bld: 220 mg/dL — ABNORMAL HIGH (ref 70–99)
Potassium: 4.5 mmol/L (ref 3.5–5.1)
Sodium: 129 mmol/L — ABNORMAL LOW (ref 135–145)

## 2024-09-01 LAB — CK: Total CK: 44 U/L — ABNORMAL LOW (ref 49–397)

## 2024-09-01 LAB — HIV ANTIBODY (ROUTINE TESTING W REFLEX): HIV Screen 4th Generation wRfx: NONREACTIVE

## 2024-09-01 LAB — LACTIC ACID, PLASMA: Lactic Acid, Venous: 1.3 mmol/L (ref 0.5–1.9)

## 2024-09-01 SURGERY — IRRIGATION AND DEBRIDEMENT ELBOW
Anesthesia: General | Site: Arm Lower | Laterality: Left

## 2024-09-01 MED ORDER — LIDOCAINE HCL (CARDIAC) PF 50 MG/5ML IV SOSY
PREFILLED_SYRINGE | INTRAVENOUS | Status: DC | PRN
  Administered 2024-09-01: 100 mg via INTRAVENOUS

## 2024-09-01 MED ORDER — FENTANYL CITRATE (PF) 50 MCG/ML IJ SOSY
PREFILLED_SYRINGE | INTRAMUSCULAR | Status: AC
  Filled 2024-09-01: qty 1

## 2024-09-01 MED ORDER — BUPIVACAINE-EPINEPHRINE (PF) 0.5% -1:200000 IJ SOLN
INTRAMUSCULAR | Status: AC
Start: 2024-09-01 — End: 2024-09-01
  Filled 2024-09-01: qty 30

## 2024-09-01 MED ORDER — KETOROLAC TROMETHAMINE 15 MG/ML IJ SOLN
15.0000 mg | Freq: Once | INTRAMUSCULAR | Status: AC
  Administered 2024-09-01: 15 mg via INTRAVENOUS
  Filled 2024-09-01: qty 1

## 2024-09-01 MED ORDER — FENTANYL CITRATE (PF) 50 MCG/ML IJ SOSY
25.0000 ug | PREFILLED_SYRINGE | INTRAMUSCULAR | Status: DC | PRN
  Administered 2024-09-01: 50 ug via INTRAVENOUS

## 2024-09-01 MED ORDER — PROPOFOL 10 MG/ML IV BOLUS
INTRAVENOUS | Status: DC | PRN
Start: 2024-09-01 — End: 2024-09-01
  Administered 2024-09-01: 160 mg via INTRAVENOUS

## 2024-09-01 MED ORDER — HYDROMORPHONE HCL 1 MG/ML IJ SOLN
0.5000 mg | INTRAMUSCULAR | Status: AC
  Administered 2024-09-01: 0.5 mg via INTRAVENOUS
  Filled 2024-09-01: qty 0.5

## 2024-09-01 MED ORDER — ONDANSETRON HCL 4 MG/2ML IJ SOLN: 4.0000 mg | Freq: Once | INTRAMUSCULAR | Status: DC | PRN

## 2024-09-01 MED ORDER — FENTANYL CITRATE (PF) 250 MCG/5ML IJ SOLN
INTRAMUSCULAR | Status: AC
  Filled 2024-09-01: qty 5

## 2024-09-01 MED ORDER — LACTATED RINGERS IV SOLN: INTRAVENOUS | Status: DC

## 2024-09-01 MED ORDER — SUCCINYLCHOLINE 20MG/ML (10ML) SYRINGE FOR MEDFUSION PUMP - OPTIME
INTRAMUSCULAR | Status: DC | PRN
  Administered 2024-09-01: 100 mg via INTRAVENOUS

## 2024-09-01 MED ORDER — SODIUM CHLORIDE 0.9 % IV SOLN: INTRAVENOUS | Status: DC

## 2024-09-01 MED ORDER — FENTANYL CITRATE (PF) 100 MCG/2ML IJ SOLN
INTRAMUSCULAR | Status: DC | PRN
  Administered 2024-09-01: 100 ug via INTRAVENOUS
  Administered 2024-09-01 (×2): 50 ug via INTRAVENOUS

## 2024-09-01 MED ORDER — PROPOFOL 10 MG/ML IV BOLUS
INTRAVENOUS | Status: AC
  Filled 2024-09-01: qty 20

## 2024-09-01 MED ORDER — ORAL CARE MOUTH RINSE: 15.0000 mL | Freq: Once | OROMUCOSAL | Status: AC

## 2024-09-01 MED ORDER — BUPIVACAINE-EPINEPHRINE (PF) 0.5% -1:200000 IJ SOLN
INTRAMUSCULAR | Status: DC | PRN
  Administered 2024-09-01: 30 mL via PERINEURAL

## 2024-09-01 MED ORDER — IOHEXOL 300 MG/ML  SOLN
75.0000 mL | Freq: Once | INTRAMUSCULAR | Status: AC | PRN
  Administered 2024-09-01: 75 mL via INTRAVENOUS

## 2024-09-01 MED ORDER — OXYCODONE HCL 5 MG/5ML PO SOLN: 5.0000 mg | Freq: Once | ORAL | Status: DC | PRN

## 2024-09-01 MED ORDER — SODIUM CHLORIDE 0.9 % IV SOLN
1.0000 g | INTRAVENOUS | Status: AC
  Administered 2024-09-01: 1 g via INTRAVENOUS

## 2024-09-01 MED ORDER — CHLORHEXIDINE GLUCONATE 0.12 % MT SOLN
15.0000 mL | Freq: Once | OROMUCOSAL | Status: AC
  Administered 2024-09-01: 15 mL via OROMUCOSAL

## 2024-09-01 MED ORDER — OXYCODONE HCL 5 MG PO TABS: 5.0000 mg | ORAL_TABLET | Freq: Once | ORAL | Status: DC | PRN

## 2024-09-01 MED ORDER — DEXMEDETOMIDINE HCL IN NACL 80 MCG/20ML IV SOLN
INTRAVENOUS | Status: DC | PRN
  Administered 2024-09-01: 12 ug via INTRAVENOUS

## 2024-09-01 MED ORDER — SUGAMMADEX SODIUM 200 MG/2ML IV SOLN
INTRAVENOUS | Status: DC | PRN
  Administered 2024-09-01: 100 mg via INTRAVENOUS

## 2024-09-01 MED ORDER — SODIUM CHLORIDE 0.9 % IR SOLN
Status: DC | PRN
  Administered 2024-09-01: 3000 mL

## 2024-09-01 MED ORDER — ROCURONIUM 10MG/ML (10ML) SYRINGE FOR MEDFUSION PUMP - OPTIME
INTRAVENOUS | Status: DC | PRN
  Administered 2024-09-01: 10 mg via INTRAVENOUS
  Administered 2024-09-01: 30 mg via INTRAVENOUS

## 2024-09-01 SURGICAL SUPPLY — 33 items
BANDAGE ESMARK 4X12 BL STRL LF (DISPOSABLE) IMPLANT
BNDG COHESIVE 4X5 TAN STRL LF (GAUZE/BANDAGES/DRESSINGS) IMPLANT
BNDG ELASTIC 4X5.8 VLCR NS LF (GAUZE/BANDAGES/DRESSINGS) IMPLANT
CLOTH BEACON ORANGE TIMEOUT ST (SAFETY) IMPLANT
COVER LIGHT HANDLE STERIS (MISCELLANEOUS) IMPLANT
CUFF TOURN SGL QUICK 18X4 (TOURNIQUET CUFF) IMPLANT
ELECTRODE REM PT RTRN 9FT ADLT (ELECTROSURGICAL) IMPLANT
GAUZE SPONGE 4X4 12PLY STRL (GAUZE/BANDAGES/DRESSINGS) IMPLANT
GAUZE XEROFORM 1X8 LF (GAUZE/BANDAGES/DRESSINGS) IMPLANT
GLOVE BIO SURGEON STRL SZ8.5 (GLOVE) IMPLANT
GLOVE BIOGEL PI IND STRL 7.0 (GLOVE) IMPLANT
GLOVE BIOGEL PI IND STRL 8.5 (GLOVE) IMPLANT
GOWN STRL REUS W/TWL LRG LVL3 (GOWN DISPOSABLE) IMPLANT
GOWN STRL REUS W/TWL XL LVL3 (GOWN DISPOSABLE) IMPLANT
KIT TURNOVER KIT A (KITS) IMPLANT
MANIFOLD NEPTUNE II (INSTRUMENTS) IMPLANT
NDL HYPO 21X1.5 SAFETY (NEEDLE) IMPLANT
PACK BASIC LIMB (CUSTOM PROCEDURE TRAY) IMPLANT
PAD ABD 5X9 TENDERSORB (GAUZE/BANDAGES/DRESSINGS) IMPLANT
PAD ARMBOARD POSITIONER FOAM (MISCELLANEOUS) IMPLANT
PAD CAST 4YDX4 CTTN HI CHSV (CAST SUPPLIES) IMPLANT
POSITIONER HEAD 8X9X4 ADT (SOFTGOODS) IMPLANT
SET BASIN LINEN APH (SET/KITS/TRAYS/PACK) IMPLANT
SET CYSTO IRRIGATION (SET/KITS/TRAYS/PACK) IMPLANT
SOL .9 NS 3000ML IRR UROMATIC (IV SOLUTION) IMPLANT
SOLN 0.9% NACL POUR BTL 1000ML (IV SOLUTION) IMPLANT
SOLUTION SCRB POV-IOD 4OZ 7.5% (MISCELLANEOUS) IMPLANT
SUT 3-0 BLK 1X30 PSL (SUTURE) IMPLANT
SUT ETHILON 3 0 PS 1 18 (SUTURE) IMPLANT
SWAB CULTURE ESWAB REG 1ML (MISCELLANEOUS) IMPLANT
SWAB CULTURE LIQ STUART DBL (MISCELLANEOUS) IMPLANT
SYR 30ML LL (SYRINGE) IMPLANT
SYR BULB IRRIG 60ML STRL (SYRINGE) IMPLANT

## 2024-09-01 NOTE — Progress Notes (Signed)
  Transition of Care [facility]) Screening Note   Patient Details  Name: Francisco Fox Date of Birth: 1966-03-24   Transition of Care [facility]) CM/SW Contact:    Hoy DELENA Bigness, LCSW Phone Number: 09/01/2024, 9:03 AM    Transition of Care Department [facility]) has reviewed patient and no TOC needs have been identified at this time. We will continue to monitor patient advancement through interdisciplinary progression rounds. If new patient transition needs arise, please place a TOC consult.    09/01/24 0903  TOC Brief Assessment  Insurance and Status Reviewed  Patient has primary care physician Yes  Home environment has been reviewed From home with spouse  Prior level of function: Independent  Prior/Current Home Services No current home services  Social Drivers of Health Review SDOH reviewed no interventions necessary  Readmission risk has been reviewed Yes  Transition of care needs no transition of care needs at this time

## 2024-09-01 NOTE — Op Note (Signed)
 Orthopaedic Surgery Operative Note (CSN: [id number])  Francisco Fox  Dec 03, 1965 Date of Surgery: 09/01/2024   Diagnoses:  Left foream abscess  Procedure: Incision and drainage of left forearm abscess, complicated   Operative Finding Successful completion of the planned procedure.  Decompressed forearm and developing abscess within musculature.  Copious amounts of frank pus.  Swab and specimen cup sent for culture.  Pocket of infection extending from the level of the medial epicondyle distally approximately 20 cm.  10 cm incision with abscess pocket extending 10 cm proximal, 8 cm medial and 6 cm lateral.  Penrose drain left in place.    Post-Op Diagnosis: Same Surgeons:Primary: Onesimo Oneil LABOR, MD Assistants: None Location: [facility] OR ROOM 4 Anesthesia: General with local anesthesia Antibiotics: Patient receiving regular doing of rocephin , additional dose provided before incision Tourniquet time:  Total Tourniquet Time Documented: Upper Arm (Left) - 34 minutes Total: Upper Arm (Left) - 34 minutes  Estimated Blood Loss: 10 cc  Complications: None Specimens: Swab and purulent drainage sent for culture  Implants: None  Indications for Surgery:   Francisco Fox is a 58 y.o. male who developed progressively worsening swelling and redness which started after a bee sting to the dorsal left hand.  He was evaluated in the ED and provided steroids for the swelling.  He was seen in clinic by Dr. Margrette who noted that the swelling was getting better, and recommended more steroids.  Since the clinic visit he reports that he was having more swelling, pain and redness.  He reports episodes of chills.  He returned the ED and was admitted.  CT scan of the forearm with contrast was obtained and was positive for a superficial abscess, as well as fluid within the forearm muscle. He has an elevated WBC and has not improved with IV antibiotics.  As such, I recommended operative intervention.  Benefits and  risks of operative and nonoperative management were discussed prior to surgery with the patient and informed consent form was completed.  Specific risks including infection, need for additional surgery, bleeding, persistent pain, stiffness, blood clots and more severe complications associated with anesthesia were discussed.  He elected to proceed.    Procedure:   The patient was identified properly. Informed consent was obtained and the surgical site was marked. The patient was taken to the OR where general anesthesia was induced.  The patient was positioned supine with his arm on a hand table.  The right arm was prepped and draped in the usual sterile fashion.  Timeout was performed before the beginning of the case.  Tourniquet was used for the above duration.  He received his regularly scheduled dose of Rocephin  prior to making incision.    The arm was evaluated.  The greatest area of induration was identified.  Based on preoperative review of the CT scan, we planned to make an incision on the volar, and medial forearm.  We made a 10 cm longitudinal incision overlying the proximal ulna, approximately 10 cm distal to the medial epicondyle. We incised sharply through skin.  We carefully dissected through subcutaneous tissue.  Some veins were cauterized.  Once we completed dissection through skin there was an immediate release of frank purulence.  It was under a lot of pressure.  We obtained a swab for culture and then collected 10 cc of sample in a specimen cup.  This was sent for culture.    We continued to decompress the abscess pocket and dissect bluntly in proximal direction to  break up adhesions.  The purulent fluid was cleaned using suction.  We then proceed to irrigate with 1 L of NS.  The abscess pocket was evaluated and noted to extend 20 cm from the medical epicondyle, 8 cm medial and 6 cm lateral.   The fascia overlying the volar forearm musculature was cleared and incised.  The fasciotomy was  extended approximately 10 cm proximal and distal.  We bluntly dissected through the muscle bellies to decompress a fluid collect which was also an abscess.  We then proceeded to irrigate the forearm with an additional 2 L NS.  We used a fresh sponge to further debride the abscess pocket.  No additional infection was encountered.  The NS was clear.  Hemostasis was maintained.   We then placed a penrose drain within the abscess pocket and extended this through the incision, leaving a 3-4 cm tail.   Incision was closed with 3-0 nylon, ensuring the penrose drain was not sutures in place. Sterile dressing was placed.  Patient was awoken taken to PACU in stable condition.   Post-operative plan:  The patient will be NWB on the operative extremity Discharge home from the PACU once they have recovered DVT prophylaxis not indicated in this ambulatory upper extremity patient without significant risk factors.    Dressing and drain to be removed POD#2 Will monitor cultures and adjust antibiosis as needed.  Pain control with PRN pain medication preferring oral medicines.   Follow up plan will be scheduled in approximately 7-10 days for incision check.

## 2024-09-01 NOTE — Anesthesia Preprocedure Evaluation (Signed)
 Anesthesia Evaluation  Patient identified by MRN, date of birth, ID band Patient awake    Reviewed: Allergy & Precautions, H&P , NPO status , Patient's Chart, lab work & pertinent test results, reviewed documented beta blocker date and time   Airway Mallampati: II  TM Distance: >3 FB Neck ROM: full    Dental no notable dental hx.    Pulmonary neg pulmonary ROS   Pulmonary exam normal breath sounds clear to auscultation       Cardiovascular Exercise Tolerance: Good hypertension, negative cardio ROS  Rhythm:regular Rate:Normal     Neuro/Psych negative neurological ROS  negative psych ROS   GI/Hepatic negative GI ROS, Neg liver ROS,GERD  ,,  Endo/Other  negative endocrine ROSdiabetes    Renal/GU negative Renal ROS  negative genitourinary   Musculoskeletal   Abdominal   Peds  Hematology negative hematology ROS (+)   Anesthesia Other Findings Patient had one bite of a muffin at 7am  Reproductive/Obstetrics negative OB ROS                              Anesthesia Physical Anesthesia Plan  ASA: 3 and emergent  Anesthesia Plan: General and General ETT   Post-op Pain Management:    Induction: Cricoid pressure planned, Rapid sequence and Intravenous  PONV Risk Score and Plan: Ondansetron   Airway Management Planned: Oral ETT  Additional Equipment:   Intra-op Plan:   Post-operative Plan:   Informed Consent: I have reviewed the patients History and Physical, chart, labs and discussed the procedure including the risks, benefits and alternatives for the proposed anesthesia with the patient or authorized representative who has indicated his/her understanding and acceptance.     Dental Advisory Given  Plan Discussed with: CRNA  Anesthesia Plan Comments: (Plan RSI, ETT.  Very small bite of breakfast 5hrs ago.  Emergency case.  )        Anesthesia Quick Evaluation

## 2024-09-01 NOTE — Transfer of Care (Signed)
 Immediate Anesthesia Transfer of Care Note  Patient: Francisco Fox  Procedure(s) Performed: IRRIGATION AND DEBRIDEMENT LEFT FOREARM (Left: Arm Lower)  Patient Location: PACU  Anesthesia Type:General  Level of Consciousness: awake  Airway & Oxygen Therapy: Patient Spontanous Breathing  Post-op Assessment: Report given to RN  Post vital signs: Reviewed and stable  Last Vitals:  Vitals Value Taken Time  BP    Temp    Pulse 109 09/01/24 13:38  Resp 24 09/01/24 13:38  SpO2 100 % 09/01/24 13:38  Vitals shown include unfiled device data.  Last Pain:  Vitals:   09/01/24 1200  TempSrc: Oral  PainSc: 5       Patients Stated Pain Goal: 4 (09/01/24 1200)  Complications: No notable events documented.

## 2024-09-01 NOTE — Progress Notes (Signed)
   ORTHOPAEDIC PROGRESS NOTE  Plan for I&D of left elbow and forearm abscess.  Surgery to be completed on an urgent basis.  DOS: 09/01/2024  SUBJECTIVE: Patient started to have pain and swelling of the left forearm and elbow area, approximately a week ago.  He is a diabetic.  He was seen in emergency department.  At that point, he was being treated for a bee sting.  He was given prednisone .  He saw Dr. Margrette in clinic.  Similar type presentation.  He continued with prednisone .  However, he has developed worsening redness and swelling.  He presented the emergency department, and was started on antibiotics.  CT scan demonstrates a superficial abscess, as well as abscess within the musculature.  He did have some chills  OBJECTIVE: PE:  Alert and oriented, no acute distress  Left forearm with diffuse swelling tenderness over the medial forearm Sensation intact distally Limited motion of the elbow due to pain No swelling or redness around the elbow.   Vitals:   08/31/24 1955 09/01/24 0417  BP: (!) 143/87 130/79  Pulse:  (!) 110  Resp:  20  Temp:  98.8 F (37.1 C)  SpO2:  94%      Latest Ref Rng & Units 09/01/2024    4:45 AM 08/31/2024    9:56 AM 07/03/2021    6:49 AM  CBC  WBC 4.0 - 10.5 K/uL 12.6  11.6    Hemoglobin 13.0 - 17.0 g/dL 88.1  87.2  86.6   Hematocrit 39.0 - 52.0 % 35.9  38.5  39.0   Platelets 150 - 400 K/uL 409  445       ASSESSMENT: Francisco Fox is a 58 y.o. male with left forearm swelling and pain, with CT scan demonstrating an abscess.  PLAN: Patient reportedly was stung by a bee recently.  He saw my partner Dr. Margrette in clinic, he was given prednisone  to help with the swelling and the pain.  Unfortunately, the pain persisted.  Patient noted that he was having worsening swelling.  He subsequently presented to the emergency department.  He was admitted and antibiotics were initiated.  However, CT scan has demonstrated a superficial abscess, as well as  abscess within the musculature of the left forearm.  Based on the appearance of the CT scan, as well as the redness and swelling he is experiencing, he will require surgery on an urgent basis.   Contact information:     Tanette Chauca A. Onesimo, MD MS [facility] [address] [place],  [place]  [address] Phone: [phone] Fax: [phone]

## 2024-09-01 NOTE — Progress Notes (Addendum)
 PROGRESS NOTE  Francisco Fox FMW:[id number] DOB: 09/04/1966 DOA: 08/31/2024 PCP: [name], MD  Brief History:  58 year old male with a history of diabetes mellitus type 2, hypertension, GERD presenting with left forearm and elbow swelling and pain.  The patient visited the emergency department on 08/23/2024 with left forearm swelling and pain.  X-rays at that time was negative for fracture.  There was a left elbow effusion.  He was discharged in stable condition with Norco and prednisone .  He followed up with orthopedics, Dr. Taft Minerva in the office on 08/27/2024.  He was given an additional course of prednisone .  Notably, the patient states that he got stung by a bee on 08/18/2024 in the left wrist first carpometacarpal area.  He stated that that area had improved without any erythema, edema, or pain at the time he visited the emergency department on 08/23/2024.  He continues to state that that area where he had the bee sting had no pain or edema.  Since his follow-up with orthopedics, the patient states that his left forearm and elbow have continued to increase in edema and pain.  As result he presented for further evaluation and treatment.  He has subjective fevers.  He denies any nausea, vomiting or diarrhea, abdominal pain.  He denies any trauma to the left elbow or forearm.  In the ED, the patient was afebrile hemodynamically stable with oxygen saturation 98% room air.  WBC 12.6, hemoglobin 1.8, platelets 409.  Sodium 129, potassium 4.5, bicarbonate 25, serum creatinine 1.05.  CK 44.  Lactic acid 3.1>> 3.1>>1.3.  The patient was started on ceftriaxone  vancomycin .   Assessment/Plan: Left forearm cellulitis/left elbow effusion - CT left forearm and elbow - Orthopedic consult - Continue vancomycin  and ceftriaxone  - Venous duplex  Diabetes mellitus type 2 - He is on Mounjaro and sitagliptin in the outpatient setting - NovoLog  sliding scale - Continue reduced dose  Semglee   Essential hypertension - Continue amlodipine  and losartan   Class I obesity - BMI 34.88 - Lifestyle modification  Mixed hyperlipidemia - Continue statin  BPH - Continue Flomax         Family Communication:   wife at bedside 11/26  Consultants:  ortho  Code Status:  FULL   DVT Prophylaxis:  Yellow Medicine Lovenox    Procedures: As Listed in Progress Note Above  Antibiotics: Vanc 11/25>> Ceftriaxone  11/25>>       Subjective: Patient states that left forearm edema and pain have not improved much.  He denies any nausea, vomiting or diarrhea.  He denies any chest pain or shortness of breath.  Objective: Vitals:   08/31/24 1852 08/31/24 1954 08/31/24 1955 09/01/24 0417  BP: (!) 153/97 (!) 174/98 (!) 143/87 130/79  Pulse: (!) 101 89  (!) 110  Resp: 20 20  20   Temp: 98.4 F (36.9 C) 98.8 F (37.1 C)  98.8 F (37.1 C)  TempSrc: Oral Oral  Oral  SpO2: 98% 98%  94%  Weight:      Height:       No intake or output data in the 24 hours ending 09/01/24 0708 Weight change:  Exam:  General:  Pt is alert, follows commands appropriately, not in acute distress HEENT: No icterus, No thrush, No neck mass, [place]/AT Cardiovascular: RRR, S1/S2, no rubs, no gallops Respiratory: CTA bilaterally, no wheezing, no crackles, no rhonchi Abdomen: Soft/+BS, non tender, non distended, no guarding Extremities: Left forearm edema extending to the elbow from the mid forearm.  There is no crepitance.  There is mild erythema--see pics        Data Reviewed: I have personally reviewed following labs and imaging studies Basic Metabolic Panel: Recent Labs  Lab 08/31/24 0956 09/01/24 0445  NA 127* 129*  K 4.6 4.5  CL 91* 94*  CO2 22 25  GLUCOSE 371* 220*  BUN 33* 27*  CREATININE 1.12 1.05  CALCIUM  9.8 9.3   Liver Function Tests: Recent Labs  Lab 08/31/24 0956  AST <10*  ALT 9  ALKPHOS 78  BILITOT 0.3  PROT 8.2*  ALBUMIN 4.1   No results for input(s): LIPASE,  AMYLASE in the last 168 hours. No results for input(s): AMMONIA in the last 168 hours. Coagulation Profile: No results for input(s): INR, PROTIME in the last 168 hours. CBC: Recent Labs  Lab 08/31/24 0956 09/01/24 0445  WBC 11.6* 12.6*  NEUTROABS 9.4*  --   HGB 12.7* 11.8*  HCT 38.5* 35.9*  MCV 85.9 86.1  PLT 445* 409*   Cardiac Enzymes: Recent Labs  Lab 09/01/24 0445  CKTOTAL 44*   BNP: Invalid input(s): POCBNP CBG: Recent Labs  Lab 08/31/24 1148 08/31/24 1305 08/31/24 2104  GLUCAP 366* 282* 409*   HbA1C: No results for input(s): HGBA1C in the last 72 hours. Urine analysis:    Component Value Date/Time   COLORURINE YELLOW 11/24/2019 0327   APPEARANCEUR HAZY (A) 11/24/2019 0327   LABSPEC 1.022 11/24/2019 0327   PHURINE 5.0 11/24/2019 0327   GLUCOSEU >=500 (A) 11/24/2019 0327   HGBUR NEGATIVE 11/24/2019 0327   BILIRUBINUR NEGATIVE 11/24/2019 0327   KETONESUR NEGATIVE 11/24/2019 0327   PROTEINUR 30 (A) 11/24/2019 0327   NITRITE NEGATIVE 11/24/2019 0327   LEUKOCYTESUR NEGATIVE 11/24/2019 0327   Sepsis Labs: @LABRCNTIP (procalcitonin:4,lacticidven:4) )No results found for this or any previous visit (from the past 240 hours).   Scheduled Meds:  amLODipine   5 mg Oral Daily   enoxaparin  (LOVENOX ) injection  40 mg Subcutaneous Q24H   fluticasone   1 spray Each Nare Daily   insulin  aspart  0-20 Units Subcutaneous TID WC   insulin  aspart  0-5 Units Subcutaneous QHS   insulin  aspart  5 Units Subcutaneous TID WC   insulin  glargine-yfgn  10 Units Subcutaneous QHS   loratadine   10 mg Oral Daily   losartan   50 mg Oral Daily   rosuvastatin   10 mg Oral QHS   tamsulosin   0.4 mg Oral QPC supper   Continuous Infusions:  cefTRIAXone  (ROCEPHIN )  IV     sodium chloride      vancomycin  HCl      Procedures/Studies: DG Elbow Complete Left Result Date: 08/31/2024 EXAM: 3 VIEW(S) XRAY OF THE LEFT ELBOW COMPARISON: Left elbow series dated 08/23/2024. CLINICAL  HISTORY: edema FINDINGS: BONES AND JOINTS: No acute fracture. No focal osseous lesion. No joint dislocation. No joint effusion. SOFT TISSUES: There has been interval development of soft tissue swelling along the dorsum of the elbow and forearm. IMPRESSION: 1. Interval development of soft tissue swelling along the dorsum of the left elbow and forearm. Electronically signed by: Evalene Coho MD 08/31/2024 10:13 AM EST RP Workstation: [id number]   DG Elbow Complete Left Result Date: 08/23/2024 EXAM: 3 VIEW(S) XRAY OF THE LEFT ELBOW COMPARISON: None available. CLINICAL HISTORY: elbow swelling FINDINGS: BONES AND JOINTS: Normal alignment. No definite fracture. Joint spaces are preserved. Elbow effusion is present. The presence of an elbow effusion suggests a radial occult fracture, and dedicated CT imaging or short-term follow-up radiographs would be helpful to further  evaluate this finding. SOFT TISSUES: The soft tissues are unremarkable. IMPRESSION: 1. No definite fracture identified. 2. Left elbow effusion, raising concern for an occult fracture. Dedicated CT imaging or short-term follow-up radiographs would be helpful to further evaluate this finding. Electronically signed by: Dorethia Molt MD 08/23/2024 02:15 AM EST RP Workstation: [id number]    Alm Schneider, DO  [facility]  If 7PM-7AM, please contact night-coverage [email or web address] Password [email or web address] 09/01/2024, 7:08 AM   LOS: 1 day

## 2024-09-01 NOTE — Brief Op Note (Signed)
 09/01/2024  1:32 PM  PATIENT:  Francisco Fox  58 y.o. male  PRE-OPERATIVE DIAGNOSIS:  Left foream abscess  POST-OPERATIVE DIAGNOSIS:  Left foream abscess  PROCEDURE:  Procedure(s): IRRIGATION AND DEBRIDEMENT LEFT FOREARM (Left)  SURGEON:  Surgeons and Role:    DEWAINE Onesimo Oneil DELENA, MD - Primary  PHYSICIAN ASSISTANT:   ASSISTANTS: none   ANESTHESIA:   local and general  EBL:  10 mL   BLOOD ADMINISTERED:none  DRAINS: Penrose drain in the left forearm   LOCAL MEDICATIONS USED:  MARCAINE      SPECIMEN:  Source of Specimen:  Left forearm abscess for culture  DISPOSITION OF SPECIMEN:  Lab for culture  COUNTS:  YES  TOURNIQUET:   Total Tourniquet Time Documented: Upper Arm (Left) - 34 minutes Total: Upper Arm (Left) - 34 minutes   DICTATION: .Note written in EPIC  PLAN OF CARE: Admit to inpatient   PATIENT DISPOSITION:  PACU - hemodynamically stable.   Delay start of Pharmacological VTE agent (>24hrs) due to surgical blood loss or risk of bleeding: yes

## 2024-09-01 NOTE — Op Note (Incomplete)
 Orthopaedic Surgery Operative Note (CSN: [id number])  Francisco Fox  September 27, 1966 Date of Surgery: 09/01/2024   Diagnoses:  Left foream abscess  Procedure: Incision and drainage of left forearm abscess, complicated   Operative Finding Successful completion of the planned procedure.  Decompressed forearm and developing abscess within musculature.  Copious amounts of frank pus.  Swab and specimen cup sent for culture.  Pocket of infection extending from the level of the medial epicondyle distally approximately 20 cm.  10 cm incision with abscess pocket extending 10 cm proximal, 8 cm medial and 6 cm lateral.  Penrose drain left in place.    Post-Op Diagnosis: Same Surgeons:Primary: Onesimo Oneil LABOR, MD Assistants: None Location: [facility] OR ROOM 4 Anesthesia: General with local anesthesia Antibiotics: Patient receiving regular doing of rocephin , additional dose provided before incision Tourniquet time:  Total Tourniquet Time Documented: Upper Arm (Left) - 34 minutes Total: Upper Arm (Left) - 34 minutes  Estimated Blood Loss: 10 cc  Complications: None Specimens: Swab and purulent drainage sent for culture  Implants: None  Indications for Surgery:   Francisco Fox is a 58 y.o. male who developed progressively worsening swelling and redness which started after a bee sting to the dorsal left hand.  He was evaluated in the ED and provided steroids for the swelling.  He was seen in clinic by Dr. Margrette who noted that the swelling was getting better, and recommended more steroids.  Since the clinic visit he reports that he was having more swelling, pain and redness.  He reports episodes of chills.  He returned the ED and was admitted.  CT scan of the forearm with contrast was obtained and was positive for a superficial abscess, as well as fluid within the forearm muscle. He has an elevated WBC and has not improved with IV antibiotics.  As such, I recommended operative intervention.  Benefits and  risks of operative and nonoperative management were discussed prior to surgery with the patient and informed consent form was completed.  Specific risks including infection, need for additional surgery, bleeding, persistent pain, stiffness, blood clots and more severe complications associated with anesthesia were discussed.  He elected to proceed.    Procedure:   The patient was identified properly. Informed consent was obtained and the surgical site was marked. The patient was taken to the OR where general anesthesia was induced.  The patient was positioned supine with his arm on a hand table.  The right *** was prepped and draped in the usual sterile fashion.  Timeout was performed before the beginning of the case.  Tourniquet was used for the above duration.***  ***  We irrigated the wound copiously.  We closed the incision in a multilayer fashion with absorbable suture.  Sterile dressing was placed.  ***Patient was awoken taken to PACU in stable condition.   Post-operative plan:  The patient will be {Weight bearing restriction:15914} on the operative extremity Discharge home from the PACU once they have recovered DVT prophylaxis {DTV DVT prophylaxis:23223}.    Pain control with PRN pain medication preferring oral medicines.   Follow up plan will be scheduled in approximately 7*** days for incision check and XR***.

## 2024-09-01 NOTE — Hospital Course (Addendum)
 58 year old male with a history of diabetes mellitus type 2, hypertension, GERD presenting with left forearm and elbow swelling and pain.  The patient visited the emergency department on 08/23/2024 with left forearm swelling and pain.  X-rays at that time was negative for fracture.  There was a left elbow effusion.  He was discharged in stable condition with Norco and prednisone .  He followed up with orthopedics, Dr. Taft Minerva in the office on 08/27/2024.  He was given an additional course of prednisone .  Notably, the patient states that he got stung by a bee on 08/18/2024 in the left wrist first carpometacarpal area.  He stated that that area had improved without any erythema, edema, or pain at the time he visited the emergency department on 08/23/2024.  He continues to state that that area where he had the bee sting had no pain or edema.  Since his follow-up with orthopedics, the patient states that his left forearm and elbow have continued to increase in edema and pain.  As result he presented for further evaluation and treatment.  He has subjective fevers.  He denies any nausea, vomiting or diarrhea, abdominal pain.  He denies any trauma to the left elbow or forearm.  In the ED, the patient was afebrile hemodynamically stable with oxygen saturation 98% room air.  WBC 12.6, hemoglobin 1.8, platelets 409.  Sodium 129, potassium 4.5, bicarbonate 25, serum creatinine 1.05.  CK 44.  Lactic acid 3.1>> 3.1>>1.3.  The patient was started on ceftriaxone  vancomycin .

## 2024-09-01 NOTE — Anesthesia Procedure Notes (Signed)
 Procedure Name: Intubation Date/Time: 09/01/2024 12:31 PM  Performed by: Toribio Darice BRAVO, CRNAPre-anesthesia Checklist: Patient identified, Patient being monitored, Timeout performed, Emergency Drugs available and Suction available Patient Re-evaluated:Patient Re-evaluated prior to induction Oxygen Delivery Method: Circle system utilized Preoxygenation: Pre-oxygenation with 100% oxygen Induction Type: IV induction Ventilation: Mask ventilation without difficulty Laryngoscope Size: Glidescope (S3) Grade View: Grade I Tube type: Oral Tube size: 7.5 mm Number of attempts: 1 Airway Equipment and Method: Stylet Placement Confirmation: ETT inserted through vocal cords under direct vision, positive ETCO2 and breath sounds checked- equal and bilateral Secured at: 22 cm Tube secured with: Tape Dental Injury: Teeth and Oropharynx as per pre-operative assessment

## 2024-09-02 DIAGNOSIS — E1165 Type 2 diabetes mellitus with hyperglycemia: Secondary | ICD-10-CM | POA: Diagnosis not present

## 2024-09-02 DIAGNOSIS — L03114 Cellulitis of left upper limb: Secondary | ICD-10-CM | POA: Diagnosis not present

## 2024-09-02 DIAGNOSIS — E66811 Obesity, class 1: Secondary | ICD-10-CM

## 2024-09-02 DIAGNOSIS — L02414 Cutaneous abscess of left upper limb: Secondary | ICD-10-CM | POA: Diagnosis not present

## 2024-09-02 DIAGNOSIS — N179 Acute kidney failure, unspecified: Secondary | ICD-10-CM

## 2024-09-02 LAB — CBC
HCT: 32.7 % — ABNORMAL LOW (ref 39.0–52.0)
Hemoglobin: 10.8 g/dL — ABNORMAL LOW (ref 13.0–17.0)
MCH: 28.4 pg (ref 26.0–34.0)
MCHC: 33 g/dL (ref 30.0–36.0)
MCV: 86.1 fL (ref 80.0–100.0)
Platelets: 311 K/uL (ref 150–400)
RBC: 3.8 MIL/uL — ABNORMAL LOW (ref 4.22–5.81)
RDW: 12.6 % (ref 11.5–15.5)
WBC: 15.4 K/uL — ABNORMAL HIGH (ref 4.0–10.5)
nRBC: 0 % (ref 0.0–0.2)

## 2024-09-02 LAB — GLUCOSE, CAPILLARY
Glucose-Capillary: 238 mg/dL — ABNORMAL HIGH (ref 70–99)
Glucose-Capillary: 199 mg/dL — ABNORMAL HIGH (ref 70–99)
Glucose-Capillary: 243 mg/dL — ABNORMAL HIGH (ref 70–99)
Glucose-Capillary: 234 mg/dL — ABNORMAL HIGH (ref 70–99)

## 2024-09-02 LAB — HEMOGLOBIN A1C
Hgb A1c MFr Bld: 7.5 % — ABNORMAL HIGH (ref 4.8–5.6)
Mean Plasma Glucose: 169 mg/dL

## 2024-09-02 LAB — BASIC METABOLIC PANEL WITH GFR
Anion gap: 11 (ref 5–15)
BUN: 28 mg/dL — ABNORMAL HIGH (ref 6–20)
CO2: 24 mmol/L (ref 22–32)
Calcium: 8.9 mg/dL (ref 8.9–10.3)
Chloride: 97 mmol/L — ABNORMAL LOW (ref 98–111)
Creatinine, Ser: 1.3 mg/dL — ABNORMAL HIGH (ref 0.61–1.24)
GFR, Estimated: >60 mL/min (ref >60)
Glucose, Bld: 227 mg/dL — ABNORMAL HIGH (ref 70–99)
Potassium: 4.1 mmol/L (ref 3.5–5.1)
Sodium: 131 mmol/L — ABNORMAL LOW (ref 135–145)

## 2024-09-02 LAB — MAGNESIUM: Magnesium: 2.1 mg/dL (ref 1.7–2.4)

## 2024-09-02 MED ORDER — INSULIN GLARGINE-YFGN 100 UNIT/ML ~~LOC~~ SOLN
15.0000 [IU] | Freq: Every day | SUBCUTANEOUS | Status: DC
  Administered 2024-09-02 – 2024-09-03 (×2): 15 [IU] via SUBCUTANEOUS
  Filled 2024-09-02 (×3): qty 0.15

## 2024-09-02 MED ORDER — INSULIN ASPART 100 UNIT/ML IJ SOLN
8.0000 [IU] | Freq: Three times a day (TID) | INTRAMUSCULAR | Status: DC
  Administered 2024-09-02 – 2024-09-04 (×5): 8 [IU] via SUBCUTANEOUS
  Filled 2024-09-02 (×4): qty 1

## 2024-09-02 MED ORDER — KETOROLAC TROMETHAMINE 15 MG/ML IJ SOLN
15.0000 mg | Freq: Once | INTRAMUSCULAR | Status: AC
  Administered 2024-09-02: 15 mg via INTRAVENOUS
  Filled 2024-09-02: qty 1

## 2024-09-02 MED ORDER — SODIUM CHLORIDE 0.9 % IV SOLN: INTRAVENOUS | Status: AC

## 2024-09-02 NOTE — Progress Notes (Addendum)
 PROGRESS NOTE  Francisco Fox FMW:[id number] DOB: Dec 15, 1965 DOA: 08/31/2024 PCP: [name], MD  Brief History:  58 year old male with a history of diabetes mellitus type 2, hypertension, GERD presenting with left forearm and elbow swelling and pain.  The patient visited the emergency department on 08/23/2024 with left forearm swelling and pain.  X-rays at that time was negative for fracture.  There was a left elbow effusion.  He was discharged in stable condition with Norco and prednisone .  He followed up with orthopedics, Dr. Taft Minerva in the office on 08/27/2024.  He was given an additional course of prednisone .  Notably, the patient states that he got stung by a bee on 08/18/2024 in the left wrist first carpometacarpal area.  He stated that that area had improved without any erythema, edema, or pain at the time he visited the emergency department on 08/23/2024.  He continues to state that that area where he had the bee sting had no pain or edema.  Since his follow-up with orthopedics, the patient states that his left forearm and elbow have continued to increase in edema and pain.  As result he presented for further evaluation and treatment.  He has subjective fevers.  He denies any nausea, vomiting or diarrhea, abdominal pain.  He denies any trauma to the left elbow or forearm.  In the ED, the patient was afebrile hemodynamically stable with oxygen saturation 98% room air.  WBC 12.6, hemoglobin 1.8, platelets 409.  Sodium 129, potassium 4.5, bicarbonate 25, serum creatinine 1.05.  CK 44.  Lactic acid 3.1>> 3.1>>1.3.  The patient was started on ceftriaxone  vancomycin .   Assessment/Plan:  Left forearm cellulitis/abscesses - CT left forearm and elbow>> abscess with loculated gas and fluid collection measuring approximately 8.8 x 1.1 x 7.9 cm.  Additional  abscess within the flexor pollicis longus muscle measuring approximately 2.0 x 1.0 x 3.8 cm - Orthopedic  consult>>discussed with Dr. Onesimo - Continue vancomycin  and ceftriaxone  pending final culture data - Venous duplex--neg for DVT - 09/01/24>>I&D left forearm--Dr. Ferris cultures - continue oxycodone  for pain  AKI -d/c losartan  -baseline creatinine 0.7-1.0 -serum creatinine peaking 1.30 -due to hemodynamic changes -restart IVF  Lactic acidosis -due to volume depletion and uncontrolled sugars -lactic 3.2>>1.3 with continued IVF -blood cultures remain neg   Diabetes mellitus type 2 - He is on Mounjaro and sitagliptin in the outpatient setting - NovoLog  sliding scale - Continue reduced dose Semglee >>increase to 15 units - increase premeal insulin  to 8 units TID - 08/31/24 A1C--7.5   Essential hypertension - Continue amlodipine  and losartan    Class I obesity - BMI 34.88 - Lifestyle modification   Mixed hyperlipidemia - Continue statin   BPH - Continue Flomax                Family Communication:  son 11/27   Consultants:  ortho   Code Status:  FULL    DVT Prophylaxis:  [place] Lovenox      Procedures: As Listed in Progress Note Above   Antibiotics: Vanc 11/25>> Ceftriaxone  11/25>>            Subjective: Pt states left arm is feeling better.  He is able to flex and extend elbow with minimal pain.  Denies f/c, cp, sob, n/v/d, abd pain  Objective: Vitals:   09/01/24 1432 09/01/24 2305 09/02/24 0440 09/02/24 1305  BP: 121/70 105/63 104/60 114/84  Pulse: (!) 115 (!) 118 (!) 109 84  Resp: 20 18  17 17  Temp: 98.6 F (37 C) 98.6 F (37 C) 98.5 F (36.9 C) 98.3 F (36.8 C)  TempSrc: Oral Oral    SpO2: 94% 96% 94% 100%  Weight:      Height:        Intake/Output Summary (Last 24 hours) at 09/02/2024 1647 Last data filed at 09/02/2024 0900 Gross per 24 hour  Intake 1180 ml  Output --  Net 1180 ml   Weight change: -8.326 kg Exam:  General:  Pt is alert, follows commands appropriately, not in acute distress HEENT: No icterus, No  thrush, No neck mass, [place]/AT Cardiovascular: RRR, S1/S2, no rubs, no gallops Respiratory: CTA bilaterally, no wheezing, no crackles, no rhonchi Abdomen: Soft/+BS, non tender, non distended, no guarding Extremities: left arm with bulky dressing.  Cap refill < 2 sec.  Sensation intact.  Radial pulse present   Data Reviewed: I have personally reviewed following labs and imaging studies Basic Metabolic Panel: Recent Labs  Lab 08/31/24 0956 09/01/24 0445 09/02/24 0434  NA 127* 129* 131*  K 4.6 4.5 4.1  CL 91* 94* 97*  CO2 22 25 24   GLUCOSE 371* 220* 227*  BUN 33* 27* 28*  CREATININE 1.12 1.05 1.30*  CALCIUM  9.8 9.3 8.9  MG  --   --  2.1   Liver Function Tests: Recent Labs  Lab 08/31/24 0956  AST <10*  ALT 9  ALKPHOS 78  BILITOT 0.3  PROT 8.2*  ALBUMIN 4.1   No results for input(s): LIPASE, AMYLASE in the last 168 hours. No results for input(s): AMMONIA in the last 168 hours. Coagulation Profile: No results for input(s): INR, PROTIME in the last 168 hours. CBC: Recent Labs  Lab 08/31/24 0956 09/01/24 0445 09/02/24 0434  WBC 11.6* 12.6* 15.4*  NEUTROABS 9.4*  --   --   HGB 12.7* 11.8* 10.8*  HCT 38.5* 35.9* 32.7*  MCV 85.9 86.1 86.1  PLT 445* 409* 311   Cardiac Enzymes: Recent Labs  Lab 09/01/24 0445  CKTOTAL 44*   BNP: Invalid input(s): POCBNP CBG: Recent Labs  Lab 09/01/24 1627 09/01/24 2054 09/02/24 0738 09/02/24 1131 09/02/24 1640  GLUCAP 235* 230* 238* 199* 243*   HbA1C: Recent Labs    08/31/24 0956  HGBA1C 7.5*   Urine analysis:    Component Value Date/Time   COLORURINE YELLOW 11/24/2019 0327   APPEARANCEUR HAZY (A) 11/24/2019 0327   LABSPEC 1.022 11/24/2019 0327   PHURINE 5.0 11/24/2019 0327   GLUCOSEU >=500 (A) 11/24/2019 0327   HGBUR NEGATIVE 11/24/2019 0327   BILIRUBINUR NEGATIVE 11/24/2019 0327   KETONESUR NEGATIVE 11/24/2019 0327   PROTEINUR 30 (A) 11/24/2019 0327   NITRITE NEGATIVE 11/24/2019 0327    LEUKOCYTESUR NEGATIVE 11/24/2019 0327   Sepsis Labs: @LABRCNTIP (procalcitonin:4,lacticidven:4) ) Recent Results (from the past 240 hours)  Culture, blood (routine x 2)     Status: None (Preliminary result)   Collection Time: 08/31/24  9:56 AM   Specimen: BLOOD  Result Value Ref Range Status   Specimen Description BLOOD RIGHT ANTECUBITAL  Final   Special Requests   Final    Blood Culture adequate volume BOTTLES DRAWN AEROBIC AND ANAEROBIC   Culture   Final    NO GROWTH 2 DAYS Performed at [facility], [address], [place], [place] [address]    Report Status PENDING  Incomplete  Culture, blood (routine x 2)     Status: None (Preliminary result)   Collection Time: 08/31/24  9:58 AM   Specimen: BLOOD  Result Value Ref Range Status   Specimen Description BLOOD BLOOD RIGHT ARM  Final   Special Requests   Final    BOTTLES DRAWN AEROBIC AND ANAEROBIC Blood Culture adequate volume   Culture   Final    NO GROWTH 2 DAYS Performed at [facility], [address], [place], [place] [address]    Report Status PENDING  Incomplete  Aerobic/Anaerobic Culture w Gram Stain (surgical/deep wound)     Status: None (Preliminary result)   Collection Time: 09/01/24 12:51 PM   Specimen: Path fluid; Body Fluid  Result Value Ref Range Status   Specimen Description   Final    FLUID Performed at [facility], [address], [place], [place] [address]    Special Requests LEFT FOREARM ABSC SPEC A  Final   Gram Stain   Final    MODERATE WBC PRESENT,BOTH PMN AND MONONUCLEAR FEW GRAM POSITIVE COCCI    Culture   Final    ABUNDANT GROUP B STREP(S.AGALACTIAE)ISOLATED TESTING AGAINST S. AGALACTIAE NOT ROUTINELY PERFORMED DUE TO PREDICTABILITY OF AMP/PEN/VAN SUSCEPTIBILITY. Performed at [facility], [address] [place], [place] [address]    Report Status PENDING  Incomplete     Scheduled Meds:  amLODipine   5 mg Oral Daily   enoxaparin  (LOVENOX ) injection  40 mg Subcutaneous Q24H    fluticasone   1 spray Each Nare Daily   insulin  aspart  0-20 Units Subcutaneous TID WC   insulin  aspart  0-5 Units Subcutaneous QHS   insulin  aspart  5 Units Subcutaneous TID WC   insulin  glargine-yfgn  10 Units Subcutaneous QHS   loratadine   10 mg Oral Daily   losartan   50 mg Oral Daily   rosuvastatin   10 mg Oral QHS   tamsulosin   0.4 mg Oral QPC supper   Continuous Infusions:  cefTRIAXone  (ROCEPHIN )  IV 1 g (09/02/24 1158)   sodium chloride      vancomycin  HCl 1,500 mg (09/01/24 1629)    Procedures/Studies: US  Venous Img Upper Uni Left (DVT) Result Date: 09/01/2024 CLINICAL DATA:  LEFT arm edema and pain status post bee-sting 3 weeks ago. EXAM: LEFT UPPER EXTREMITY VENOUS DOPPLER ULTRASOUND TECHNIQUE: Gray-scale sonography with graded compression, as well as color Doppler and duplex ultrasound were performed to evaluate the upper extremity deep venous system from the level of the subclavian vein and including the jugular, axillary, basilic, radial, ulnar and upper cephalic vein. Spectral Doppler was utilized to evaluate flow at rest and with distal augmentation maneuvers. COMPARISON:  None available FINDINGS: Contralateral Subclavian Vein: Respiratory phasicity is normal and symmetric with the symptomatic side. No evidence of thrombus. Normal compressibility. Internal Jugular Vein: No evidence of thrombus. Normal compressibility, respiratory phasicity and response to augmentation. Subclavian Vein: No evidence of thrombus. Normal compressibility, respiratory phasicity and response to augmentation. Axillary Vein: No evidence of thrombus. Normal compressibility, respiratory phasicity and response to augmentation. Cephalic Vein: No evidence of thrombus. Normal compressibility, respiratory phasicity and response to augmentation. Basilic Vein: No evidence of thrombus. Normal compressibility, respiratory phasicity and response to augmentation. Brachial Veins: No evidence of thrombus. Normal  compressibility, respiratory phasicity and response to augmentation. Radial Veins: No evidence of thrombus. Normal compressibility, respiratory phasicity and response to augmentation. Ulnar Veins: No evidence of thrombus. Normal compressibility, respiratory phasicity and response to augmentation. Other Findings:  None visualized. IMPRESSION: No evidence of DVT within the left upper extremity. Electronically Signed   By: Aliene Lloyd M.D.   On: 09/01/2024 13:33   CT FOREARM LEFT W  CONTRAST Result Date: 09/01/2024 EXAM: CT LEFT FOREARM, WITH IV CONTRAST TECHNIQUE: Axial images were acquired through the left forearm with IV contrast. Reformatted images were reviewed. Automated exposure control, iterative reconstruction, and/or weight based adjustment of the mA/kV was utilized to reduce the radiation dose to as low as reasonably achievable. COMPARISON: Elbow radiographs 08/31/2024. CLINICAL HISTORY: Left forearm edema, cellulitis--rule out abscess. FINDINGS: BONES AND JOINTS: Small degenerative subcortical cysts or geodes in the scaphoid and lunate. Erosions along the distal humerus trochlea as on images 127 through 141 of series 7. Degenerative arthropathy at the radiocapitellar articulation with loss of articular space and spurring. Small geode on the distal articular margin of the ulna. Small ossicle volar to the hamate, possibly an unfused ossification center along the hamate hook. Degenerative spurring at the first metatarsophalangeal joint potentially with some chronically fragmented spur noted. Moderate elbow joint effusion with enhancing margin suspicious for septic disease. Anterior erosions along the trochlear groove could be related to infection. SOFT TISSUES: Substantial subcutaneous edema dorsal to the distal humerus. Subcutaneous edema tracks along the ulnar and volar forearm down to the wrist. Loculations of gas in a subcutaneous collection anterior to the superficial head of the pronator teres and  medial to the flexor carpi radialis along the proximal forearm as on image 344 series 9, with the fluid collection measuring about 8.8 x 1.1 x 7.9 cm (volume = 40 cm\S\3). An abscess in the flexor pollicis longus muscle measures 2.0 x 1.0 x 3.8 cm (volume = 4 cm\S\3) on image 239 series 9. Small amount of nonspecific fluid signal intensity along the distal biceps tendon on images 323 through 348 of series 9, cannot exclude a small abscess. Substantial heterogeneity in the supinator muscle around the proximal radius for example on image 328 series 9, suspicious for incipient abscess formation. IMPRESSION: 1. Left forearm subcutaneous abscess with loculated gas and fluid collection measuring approximately 8.8 x 1.1 x 7.9 cm (volume  40 cm). 2. Additional intramuscular abscess within the flexor pollicis longus muscle measuring approximately 2.0 x 1.0 x 3.8 cm (volume  4 cm). 3. Moderate elbow joint effusion with enhancing synovium, suspicious for septic arthritis. 4. Findings along the distal biceps tendon sheath and heterogeneous supinator muscle are suspicious for early or incipient abscess formation. 5. Anterior erosions along the trochlear groove may be related to infection. 6. Degenerative arthropathy at the radiocapitellar articulation with loss of articular space and spurring. Electronically signed by: Ryan Salvage MD 09/01/2024 09:40 AM EST RP Workstation: [id number]   DG Elbow Complete Left Result Date: 08/31/2024 EXAM: 3 VIEW(S) XRAY OF THE LEFT ELBOW COMPARISON: Left elbow series dated 08/23/2024. CLINICAL HISTORY: edema FINDINGS: BONES AND JOINTS: No acute fracture. No focal osseous lesion. No joint dislocation. No joint effusion. SOFT TISSUES: There has been interval development of soft tissue swelling along the dorsum of the elbow and forearm. IMPRESSION: 1. Interval development of soft tissue swelling along the dorsum of the left elbow and forearm. Electronically signed by: Evalene Coho MD 08/31/2024 10:13 AM EST RP Workstation: [id number]   DG Elbow Complete Left Result Date: 08/23/2024 EXAM: 3 VIEW(S) XRAY OF THE LEFT ELBOW COMPARISON: None available. CLINICAL HISTORY: elbow swelling FINDINGS: BONES AND JOINTS: Normal alignment. No definite fracture. Joint spaces are preserved. Elbow effusion is present. The presence of an elbow effusion suggests a radial occult fracture, and dedicated CT imaging or short-term follow-up radiographs would be helpful to further evaluate this finding. SOFT TISSUES: The soft tissues are unremarkable. IMPRESSION: 1. No  definite fracture identified. 2. Left elbow effusion, raising concern for an occult fracture. Dedicated CT imaging or short-term follow-up radiographs would be helpful to further evaluate this finding. Electronically signed by: Dorethia Molt MD 08/23/2024 02:15 AM EST RP Workstation: [id number]    Alm Schneider, DO  [facility]  If 7PM-7AM, please contact night-coverage [email or web address] Password [facility] 09/02/2024, 4:47 PM   LOS: 2 days

## 2024-09-02 NOTE — Plan of Care (Signed)

## 2024-09-03 ENCOUNTER — Encounter (HOSPITAL_COMMUNITY): Payer: Self-pay | Admitting: Orthopedic Surgery

## 2024-09-03 DIAGNOSIS — E1165 Type 2 diabetes mellitus with hyperglycemia: Secondary | ICD-10-CM | POA: Diagnosis not present

## 2024-09-03 DIAGNOSIS — N179 Acute kidney failure, unspecified: Secondary | ICD-10-CM | POA: Diagnosis not present

## 2024-09-03 DIAGNOSIS — L03114 Cellulitis of left upper limb: Secondary | ICD-10-CM | POA: Diagnosis not present

## 2024-09-03 DIAGNOSIS — L02414 Cutaneous abscess of left upper limb: Secondary | ICD-10-CM | POA: Diagnosis not present

## 2024-09-03 LAB — CBC
HCT: 31.2 % — ABNORMAL LOW (ref 39.0–52.0)
Hemoglobin: 10.2 g/dL — ABNORMAL LOW (ref 13.0–17.0)
MCH: 28.1 pg (ref 26.0–34.0)
MCHC: 32.7 g/dL (ref 30.0–36.0)
MCV: 86 fL (ref 80.0–100.0)
Platelets: 270 K/uL (ref 150–400)
RBC: 3.63 MIL/uL — ABNORMAL LOW (ref 4.22–5.81)
RDW: 12.8 % (ref 11.5–15.5)
WBC: 15.3 K/uL — ABNORMAL HIGH (ref 4.0–10.5)
nRBC: 0 % (ref 0.0–0.2)

## 2024-09-03 LAB — GLUCOSE, CAPILLARY
Glucose-Capillary: 195 mg/dL — ABNORMAL HIGH (ref 70–99)
Glucose-Capillary: 181 mg/dL — ABNORMAL HIGH (ref 70–99)
Glucose-Capillary: 149 mg/dL — ABNORMAL HIGH (ref 70–99)
Glucose-Capillary: 151 mg/dL — ABNORMAL HIGH (ref 70–99)

## 2024-09-03 LAB — BASIC METABOLIC PANEL WITH GFR
Anion gap: 10 (ref 5–15)
BUN: 17 mg/dL (ref 6–20)
CO2: 23 mmol/L (ref 22–32)
Calcium: 8.5 mg/dL — ABNORMAL LOW (ref 8.9–10.3)
Chloride: 97 mmol/L — ABNORMAL LOW (ref 98–111)
Creatinine, Ser: 1.02 mg/dL (ref 0.61–1.24)
GFR, Estimated: >60 mL/min (ref >60)
Glucose, Bld: 227 mg/dL — ABNORMAL HIGH (ref 70–99)
Potassium: 4.1 mmol/L (ref 3.5–5.1)
Sodium: 130 mmol/L — ABNORMAL LOW (ref 135–145)

## 2024-09-03 LAB — MAGNESIUM: Magnesium: 2.2 mg/dL (ref 1.7–2.4)

## 2024-09-03 MED ORDER — SODIUM CHLORIDE 0.9 % IV SOLN: 2.0000 g | INTRAVENOUS | Status: DC

## 2024-09-03 NOTE — Progress Notes (Signed)
 Mobility Specialist Progress Note:    09/03/24 1315  Mobility  Activity Ambulated with assistance  Level of Assistance Independent  Assistive Device None  Distance Ambulated (ft) 500 ft  Range of Motion/Exercises Active;All extremities  Activity Response Tolerated well  Mobility Referral Yes  Mobility visit 1 Mobility  Mobility Specialist Start Time (ACUTE ONLY) 1315  Mobility Specialist Stop Time (ACUTE ONLY) 1335  Mobility Specialist Time Calculation (min) (ACUTE ONLY) 20 min   Pt received in bed, agreeable to mobility. Independently able to stand and ambulate with no AD. Tolerated well, asx throughout. Left sitting EOB, all needs met.  Francisco Fox Mobility Specialist Please contact via Special Educational Needs Teacher or  Rehab office at [phone]

## 2024-09-03 NOTE — Anesthesia Postprocedure Evaluation (Signed)
 Anesthesia Post Note  Patient: Francisco Fox  Procedure(s) Performed: IRRIGATION AND DEBRIDEMENT LEFT FOREARM (Left: Arm Lower)  Patient location during evaluation: Phase II Anesthesia Type: General Level of consciousness: awake Pain management: pain level controlled Vital Signs Assessment: post-procedure vital signs reviewed and stable Respiratory status: spontaneous breathing and respiratory function stable Cardiovascular status: blood pressure returned to baseline and stable Postop Assessment: no headache and no apparent nausea or vomiting Anesthetic complications: no Comments: Late entry   No notable events documented.   Last Vitals:  Vitals:   09/03/24 0851 09/03/24 1422  BP: 120/78 137/74  Pulse:  100  Resp:  20  Temp:  36.4 C  SpO2:  100%    Last Pain:  Vitals:   09/03/24 1422  TempSrc: Oral  PainSc:                  Yvonna JINNY Bosworth

## 2024-09-03 NOTE — Progress Notes (Addendum)
 PROGRESS NOTE  Francisco Fox FMW:[id number] DOB: [birth year] DOA: 08/31/2024 PCP: [name], MD  Brief History:  58 year old male with a history of diabetes mellitus type 2, hypertension, GERD presenting with left forearm and elbow swelling and pain.  The patient visited the emergency department on 08/23/2024 with left forearm swelling and pain.  X-rays at that time was negative for fracture.  There was a left elbow effusion.  He was discharged in stable condition with Norco and prednisone .  He followed up with orthopedics, Dr. Taft Minerva in the office on 08/27/2024.  He was given an additional course of prednisone .  Notably, the patient states that he got stung by a bee on 08/18/2024 in the left wrist first carpometacarpal area.  He stated that that area had improved without any erythema, edema, or pain at the time he visited the emergency department on 08/23/2024.  He continues to state that that area where he had the bee sting had no pain or edema.  Since his follow-up with orthopedics, the patient states that his left forearm and elbow have continued to increase in edema and pain.  As result he presented for further evaluation and treatment.  He has subjective fevers.  He denies any nausea, vomiting or diarrhea, abdominal pain.  He denies any trauma to the left elbow or forearm.  In the ED, the patient was afebrile hemodynamically stable with oxygen saturation 98% room air.  WBC 12.6, hemoglobin 1.8, platelets 409.  Sodium 129, potassium 4.5, bicarbonate 25, serum creatinine 1.05.  CK 44.  Lactic acid 3.1>> 3.1>>1.3.  The patient was started on ceftriaxone  vancomycin .   Assessment/Plan:  Left forearm cellulitis/abscesses - CT left forearm and elbow>> abscess with loculated gas and fluid collection measuring approximately 8.8 x 1.1 x 7.9 cm.  Additional  abscess within the flexor pollicis longus muscle measuring approximately 2.0 x 1.0 x 3.8 cm - Orthopedic  consult>>discussed with Dr. Onesimo - Continue vancomycin  and ceftriaxone  pending final culture data - Venous duplex--neg for DVT - 09/01/24>>I&D left forearm--Dr. Ferris cultures - continue oxycodone  for pain - operative culture = GBS>>d/c vanc - WBC 15.3; repeat CBC in am - discussed with Dr. Onesimo - plan MRI left arm if    AKI -d/c losartan  -baseline creatinine 0.7-1.0 -serum creatinine peaking 1.30 -due to hemodynamic changes -restart IVF>>improved   Lactic acidosis -due to volume depletion and uncontrolled sugars -lactic 3.2>>1.3 with continued IVF -blood cultures remain neg   Diabetes mellitus type 2 - He is on Mounjaro and sitagliptin in the outpatient setting - NovoLog  sliding scale - Continue reduced dose Semglee >>increase to 15 units - increased premeal insulin  to 8 units TID - 08/31/24 A1C--7.5   Essential hypertension - Continue amlodipine   - losartan  on hold temporarily   Class I obesity - BMI 34.88 - Lifestyle modification   Mixed hyperlipidemia - Continue statin   BPH - Continue Flomax                Family Communication:  son 11/28   Consultants:  ortho   Code Status:  FULL    DVT Prophylaxis:  [place] Lovenox      Procedures: As Listed in Progress Note Above   Antibiotics: Vanc 11/25>>11/28 Ceftriaxone  11/25>>          Subjective: Pt states left arm pain is controlled.  Has some swelling in arm about same.  Able to bend left elbow without pain.  Denies cp, sob, n/v/d, abd pain  Objective: Vitals:   09/02/24 2325 09/03/24 0424 09/03/24 0851 09/03/24 1422  BP: 124/83 139/85 120/78 137/74  Pulse: 99 87  100  Resp: 18 18  20   Temp: 98 F (36.7 C) 97.9 F (36.6 C)  97.6 F (36.4 C)  TempSrc: Oral Oral  Oral  SpO2: 98% 97%  100%  Weight:      Height:        Intake/Output Summary (Last 24 hours) at 09/03/2024 1733 Last data filed at 09/03/2024 1700 Gross per 24 hour  Intake 2108.65 ml  Output --  Net 2108.65  ml   Weight change:  Exam:  General:  Pt is alert, follows commands appropriately, not in acute distress HEENT: No icterus, No thrush, No neck mass, [place]/AT Cardiovascular: RRR, S1/S2, no rubs, no gallops Respiratory: CTA bilaterally, no wheezing, no crackles, no rhonchi Abdomen: Soft/+BS, non tender, non distended, no guarding Extremities: left hand cap refill < 2 sec.  Radial pulse present.  Compartments soft in forearm. +bulky dressing   Data Reviewed: I have personally reviewed following labs and imaging studies Basic Metabolic Panel: Recent Labs  Lab 08/31/24 0956 09/01/24 0445 09/02/24 0434 09/03/24 0445  NA 127* 129* 131* 130*  K 4.6 4.5 4.1 4.1  CL 91* 94* 97* 97*  CO2 22 25 24 23   GLUCOSE 371* 220* 227* 227*  BUN 33* 27* 28* 17  CREATININE 1.12 1.05 1.30* 1.02  CALCIUM  9.8 9.3 8.9 8.5*  MG  --   --  2.1 2.2   Liver Function Tests: Recent Labs  Lab 08/31/24 0956  AST <10*  ALT 9  ALKPHOS 78  BILITOT 0.3  PROT 8.2*  ALBUMIN 4.1   No results for input(s): LIPASE, AMYLASE in the last 168 hours. No results for input(s): AMMONIA in the last 168 hours. Coagulation Profile: No results for input(s): INR, PROTIME in the last 168 hours. CBC: Recent Labs  Lab 08/31/24 0956 09/01/24 0445 09/02/24 0434 09/03/24 0445  WBC 11.6* 12.6* 15.4* 15.3*  NEUTROABS 9.4*  --   --   --   HGB 12.7* 11.8* 10.8* 10.2*  HCT 38.5* 35.9* 32.7* 31.2*  MCV 85.9 86.1 86.1 86.0  PLT 445* 409* 311 270   Cardiac Enzymes: Recent Labs  Lab 09/01/24 0445  CKTOTAL 44*   BNP: Invalid input(s): POCBNP CBG: Recent Labs  Lab 09/02/24 1640 09/02/24 2029 09/03/24 0802 09/03/24 1145 09/03/24 1602  GLUCAP 243* 234* 195* 181* 149*   HbA1C: No results for input(s): HGBA1C in the last 72 hours. Urine analysis:    Component Value Date/Time   COLORURINE YELLOW 11/24/2019 0327   APPEARANCEUR HAZY (A) 11/24/2019 0327   LABSPEC 1.022 11/24/2019 0327   PHURINE 5.0  11/24/2019 0327   GLUCOSEU >=500 (A) 11/24/2019 0327   HGBUR NEGATIVE 11/24/2019 0327   BILIRUBINUR NEGATIVE 11/24/2019 0327   KETONESUR NEGATIVE 11/24/2019 0327   PROTEINUR 30 (A) 11/24/2019 0327   NITRITE NEGATIVE 11/24/2019 0327   LEUKOCYTESUR NEGATIVE 11/24/2019 0327   Sepsis Labs: @LABRCNTIP (procalcitonin:4,lacticidven:4) ) Recent Results (from the past 240 hours)  Culture, blood (routine x 2)     Status: None (Preliminary result)   Collection Time: 08/31/24  9:56 AM   Specimen: BLOOD  Result Value Ref Range Status   Specimen Description BLOOD RIGHT ANTECUBITAL  Final   Special Requests   Final    Blood Culture adequate volume BOTTLES DRAWN AEROBIC AND ANAEROBIC   Culture   Final    NO GROWTH 3 DAYS Performed at [facility], [address], [place], [place] [address]    Report Status PENDING  Incomplete  Culture, blood (routine x 2)     Status: None (Preliminary result)   Collection Time: 08/31/24  9:58 AM   Specimen: BLOOD  Result Value Ref Range Status   Specimen Description BLOOD BLOOD RIGHT ARM  Final   Special Requests   Final    BOTTLES DRAWN AEROBIC AND ANAEROBIC Blood Culture adequate volume   Culture   Final    NO GROWTH 3 DAYS Performed at [facility], [address], [place], [place] [address]    Report Status PENDING  Incomplete  Aerobic/Anaerobic Culture w Gram Stain (surgical/deep wound)     Status: None (Preliminary result)   Collection Time: 09/01/24 12:51 PM   Specimen: Path fluid; Body Fluid  Result Value Ref Range Status   Specimen Description   Final    FLUID Performed at [facility], [address] [place], [place] [address]    Special Requests LEFT FOREARM ABSC SPEC A  Final   Gram Stain   Final    MODERATE WBC PRESENT,BOTH PMN AND MONONUCLEAR FEW GRAM POSITIVE COCCI Performed at [facility], [address], [place], [place] [address]    Culture   Final    ABUNDANT GROUP B STREP(S.AGALACTIAE)ISOLATED TESTING AGAINST S. AGALACTIAE  NOT ROUTINELY PERFORMED DUE TO PREDICTABILITY OF AMP/PEN/VAN SUSCEPTIBILITY. NO ANAEROBES ISOLATED; CULTURE IN PROGRESS FOR 5 DAYS    Report Status PENDING  Incomplete     Scheduled Meds:  amLODipine   5 mg Oral Daily   enoxaparin  (LOVENOX ) injection  40 mg Subcutaneous Q24H   fluticasone   1 spray Each Nare Daily   insulin  aspart  0-20 Units Subcutaneous TID WC   insulin  aspart  0-5 Units Subcutaneous QHS   insulin  aspart  8 Units Subcutaneous TID WC   insulin  glargine-yfgn  15 Units Subcutaneous QHS   loratadine   10 mg Oral Daily   rosuvastatin   10 mg Oral QHS   tamsulosin   0.4 mg Oral QPC supper   Continuous Infusions:  cefTRIAXone  (ROCEPHIN )  IV 1 g (09/03/24 1232)   sodium chloride       Procedures/Studies: US  Venous Img Upper Uni Left (DVT) Result Date: 09/01/2024 CLINICAL DATA:  LEFT arm edema and pain status post bee-sting 3 weeks ago. EXAM: LEFT UPPER EXTREMITY VENOUS DOPPLER ULTRASOUND TECHNIQUE: Gray-scale sonography with graded compression, as well as color Doppler and duplex ultrasound were performed to evaluate the upper extremity deep venous system from the level of the subclavian vein and including the jugular, axillary, basilic, radial, ulnar and upper cephalic vein. Spectral Doppler was utilized to evaluate flow at rest and with distal augmentation maneuvers. COMPARISON:  None available FINDINGS: Contralateral Subclavian Vein: Respiratory phasicity is normal and symmetric with the symptomatic side. No evidence of thrombus. Normal compressibility. Internal Jugular Vein: No evidence of thrombus. Normal compressibility, respiratory phasicity and response to augmentation. Subclavian Vein: No evidence of thrombus. Normal compressibility, respiratory phasicity and response to augmentation. Axillary Vein: No evidence of thrombus. Normal compressibility, respiratory phasicity and response to augmentation. Cephalic Vein: No evidence of thrombus. Normal compressibility, respiratory  phasicity and response to augmentation. Basilic Vein: No evidence of thrombus. Normal compressibility, respiratory phasicity and response to augmentation. Brachial Veins: No evidence of thrombus. Normal compressibility, respiratory phasicity and response to augmentation. Radial Veins: No evidence of thrombus. Normal compressibility, respiratory phasicity and response to augmentation. Ulnar Veins: No evidence of thrombus. Normal compressibility, respiratory phasicity and response to  augmentation. Other Findings:  None visualized. IMPRESSION: No evidence of DVT within the left upper extremity. Electronically Signed   By: Aliene Lloyd M.D.   On: 09/01/2024 13:33   CT FOREARM LEFT W CONTRAST Result Date: 09/01/2024 EXAM: CT LEFT FOREARM, WITH IV CONTRAST TECHNIQUE: Axial images were acquired through the left forearm with IV contrast. Reformatted images were reviewed. Automated exposure control, iterative reconstruction, and/or weight based adjustment of the mA/kV was utilized to reduce the radiation dose to as low as reasonably achievable. COMPARISON: Elbow radiographs 08/31/2024. CLINICAL HISTORY: Left forearm edema, cellulitis--rule out abscess. FINDINGS: BONES AND JOINTS: Small degenerative subcortical cysts or geodes in the scaphoid and lunate. Erosions along the distal humerus trochlea as on images 127 through 141 of series 7. Degenerative arthropathy at the radiocapitellar articulation with loss of articular space and spurring. Small geode on the distal articular margin of the ulna. Small ossicle volar to the hamate, possibly an unfused ossification center along the hamate hook. Degenerative spurring at the first metatarsophalangeal joint potentially with some chronically fragmented spur noted. Moderate elbow joint effusion with enhancing margin suspicious for septic disease. Anterior erosions along the trochlear groove could be related to infection. SOFT TISSUES: Substantial subcutaneous edema dorsal to the  distal humerus. Subcutaneous edema tracks along the ulnar and volar forearm down to the wrist. Loculations of gas in a subcutaneous collection anterior to the superficial head of the pronator teres and medial to the flexor carpi radialis along the proximal forearm as on image 344 series 9, with the fluid collection measuring about 8.8 x 1.1 x 7.9 cm (volume = 40 cm\S\3). An abscess in the flexor pollicis longus muscle measures 2.0 x 1.0 x 3.8 cm (volume = 4 cm\S\3) on image 239 series 9. Small amount of nonspecific fluid signal intensity along the distal biceps tendon on images 323 through 348 of series 9, cannot exclude a small abscess. Substantial heterogeneity in the supinator muscle around the proximal radius for example on image 328 series 9, suspicious for incipient abscess formation. IMPRESSION: 1. Left forearm subcutaneous abscess with loculated gas and fluid collection measuring approximately 8.8 x 1.1 x 7.9 cm (volume  40 cm). 2. Additional intramuscular abscess within the flexor pollicis longus muscle measuring approximately 2.0 x 1.0 x 3.8 cm (volume  4 cm). 3. Moderate elbow joint effusion with enhancing synovium, suspicious for septic arthritis. 4. Findings along the distal biceps tendon sheath and heterogeneous supinator muscle are suspicious for early or incipient abscess formation. 5. Anterior erosions along the trochlear groove may be related to infection. 6. Degenerative arthropathy at the radiocapitellar articulation with loss of articular space and spurring. Electronically signed by: Ryan Salvage MD 09/01/2024 09:40 AM EST RP Workstation: [id number]   DG Elbow Complete Left Result Date: 08/31/2024 EXAM: 3 VIEW(S) XRAY OF THE LEFT ELBOW COMPARISON: Left elbow series dated 08/23/2024. CLINICAL HISTORY: edema FINDINGS: BONES AND JOINTS: No acute fracture. No focal osseous lesion. No joint dislocation. No joint effusion. SOFT TISSUES: There has been interval development of soft tissue  swelling along the dorsum of the elbow and forearm. IMPRESSION: 1. Interval development of soft tissue swelling along the dorsum of the left elbow and forearm. Electronically signed by: Evalene Coho MD 08/31/2024 10:13 AM EST RP Workstation: [id number]   DG Elbow Complete Left Result Date: 08/23/2024 EXAM: 3 VIEW(S) XRAY OF THE LEFT ELBOW COMPARISON: None available. CLINICAL HISTORY: elbow swelling FINDINGS: BONES AND JOINTS: Normal alignment. No definite fracture. Joint spaces are preserved. Elbow effusion is present. The  presence of an elbow effusion suggests a radial occult fracture, and dedicated CT imaging or short-term follow-up radiographs would be helpful to further evaluate this finding. SOFT TISSUES: The soft tissues are unremarkable. IMPRESSION: 1. No definite fracture identified. 2. Left elbow effusion, raising concern for an occult fracture. Dedicated CT imaging or short-term follow-up radiographs would be helpful to further evaluate this finding. Electronically signed by: Dorethia Molt MD 08/23/2024 02:15 AM EST RP Workstation: [id number]    Alm Schneider, DO  [facility]  If 7PM-7AM, please contact night-coverage [email or web address] Password [facility] 09/03/2024, 5:33 PM   LOS: 3 days

## 2024-09-03 NOTE — Progress Notes (Signed)
   ORTHOPAEDIC PROGRESS NOTE  s/p Procedure(s): IRRIGATION AND DEBRIDEMENT LEFT FOREARM  DOS: 09/01/24  SUBJECTIVE: Overnight.  Pain is controlled.  He was.  No fevers or chills.  OBJECTIVE: PE:  Left arm with diffuse swelling.  No fluctuance is appreciated.  Incision is clean and intact.  There is some bloody drainage.  No purulence is expressed.  Diffuse swelling to the left hand.  Sensation intact throughout the left hand.  Fingers warm well-perfused.  Vitals:   09/03/24 0424 09/03/24 0851  BP: 139/85 120/78  Pulse: 87   Resp: 18   Temp: 97.9 F (36.6 C)   SpO2: 97%       Latest Ref Rng & Units 09/03/2024    4:45 AM 09/02/2024    4:34 AM 09/01/2024    4:45 AM  CBC  WBC 4.0 - 10.5 K/uL 15.3  15.4  12.6   Hemoglobin 13.0 - 17.0 g/dL 89.7  89.1  88.1   Hematocrit 39.0 - 52.0 % 31.2  32.7  35.9   Platelets 150 - 400 K/uL 270  311  409      ASSESSMENT: Francisco Fox is a 58 y.o. male stable on postop day #2 following I&D.  PLAN: Weightbearing: WBAT LUE; encourage range of motion weightbearing. Incisional and dressing care: Reinforce dressings as needed.  Dressing was changed today.  Penrose drain was removed. Orthopedic device(s): None VTE prophylaxis: At discretion of the medicine team.  No orthopedic contraindications.  Recommend regular ambulation. Pain control: As needed.  Elevate the arm to help with swelling. Follow - up plan: Will plan to see him in clinic early next week.  In general, he is doing very well.  Pain is better.  Motion is better.  He does have some diffuse swelling, which is likely result of limited motion.  His white count remains elevated.  Unclear what is causing this.  There is no obvious infection remaining within the left arm.  He feels better.  If white count remains elevated, consider repeat imaging, prefer an MRI.  If stable, and is discharge from the hospital, he can be seen in clinic on Monday or Tuesday of next week.  Cultures  positive for group B strep (strep agalactiae).  Antibiotics can be tailored for this bacteria.   Contact information:     Dario Yono A. Onesimo, MD MS [facility] [address] [place],  [place]  [address] Phone: [phone] Fax: [phone]

## 2024-09-03 NOTE — Progress Notes (Signed)
 Start Date: 11/28  M: T: W: Th: F: 533ft. Ind. NoAD. S: Su:

## 2024-09-03 NOTE — Plan of Care (Signed)

## 2024-09-04 DIAGNOSIS — L03114 Cellulitis of left upper limb: Secondary | ICD-10-CM | POA: Diagnosis not present

## 2024-09-04 DIAGNOSIS — E66811 Obesity, class 1: Secondary | ICD-10-CM | POA: Diagnosis not present

## 2024-09-04 DIAGNOSIS — L02414 Cutaneous abscess of left upper limb: Secondary | ICD-10-CM | POA: Diagnosis not present

## 2024-09-04 DIAGNOSIS — E1165 Type 2 diabetes mellitus with hyperglycemia: Secondary | ICD-10-CM | POA: Diagnosis not present

## 2024-09-04 LAB — GLUCOSE, CAPILLARY: Glucose-Capillary: 167 mg/dL — ABNORMAL HIGH (ref 70–99)

## 2024-09-04 LAB — CBC
HCT: 31.1 % — ABNORMAL LOW (ref 39.0–52.0)
Hemoglobin: 10.2 g/dL — ABNORMAL LOW (ref 13.0–17.0)
MCH: 28.1 pg (ref 26.0–34.0)
MCHC: 32.8 g/dL (ref 30.0–36.0)
MCV: 85.7 fL (ref 80.0–100.0)
Platelets: 256 K/uL (ref 150–400)
RBC: 3.63 MIL/uL — ABNORMAL LOW (ref 4.22–5.81)
RDW: 12.6 % (ref 11.5–15.5)
WBC: 13.4 K/uL — ABNORMAL HIGH (ref 4.0–10.5)
nRBC: 0 % (ref 0.0–0.2)

## 2024-09-04 LAB — BASIC METABOLIC PANEL WITH GFR
Anion gap: 9 (ref 5–15)
BUN: 14 mg/dL (ref 6–20)
CO2: 24 mmol/L (ref 22–32)
Calcium: 8.7 mg/dL — ABNORMAL LOW (ref 8.9–10.3)
Chloride: 96 mmol/L — ABNORMAL LOW (ref 98–111)
Creatinine, Ser: 1.07 mg/dL (ref 0.61–1.24)
GFR, Estimated: >60 mL/min (ref >60)
Glucose, Bld: 166 mg/dL — ABNORMAL HIGH (ref 70–99)
Potassium: 4.1 mmol/L (ref 3.5–5.1)
Sodium: 130 mmol/L — ABNORMAL LOW (ref 135–145)

## 2024-09-04 MED ORDER — OXYCODONE HCL 5 MG PO TABS: 5.0000 mg | ORAL_TABLET | ORAL | 0 refills | Status: DC | PRN

## 2024-09-04 MED ORDER — SODIUM CHLORIDE 0.9 % IV SOLN
2.0000 g | Freq: Once | INTRAVENOUS | Status: AC
  Administered 2024-09-04: 2 g via INTRAVENOUS
  Filled 2024-09-04: qty 20

## 2024-09-04 MED ORDER — AMOXICILLIN-POT CLAVULANATE 875-125 MG PO TABS: 1.0000 | ORAL_TABLET | Freq: Two times a day (BID) | ORAL | Status: DC

## 2024-09-04 MED ORDER — AMOXICILLIN-POT CLAVULANATE 875-125 MG PO TABS: 1.0000 | ORAL_TABLET | Freq: Two times a day (BID) | ORAL | 0 refills | Status: AC

## 2024-09-04 NOTE — Plan of Care (Signed)

## 2024-09-04 NOTE — Discharge Summary (Addendum)
 Physician Discharge Summary   Patient: Francisco Fox MRN: [id number] DOB: 10-14-1965  Admit date:     08/31/2024  Discharge date: 09/04/24  Discharge Physician: Francisco Fox   PCP: Francisco Rush, MD   Recommendations at discharge:   Please follow up with primary care provider within 1-2 weeks  Please repeat BMP and CBC in one week Follow up Francisco Fox in clinic on 09/06/24    Hospital Course: 58 year old male with a history of diabetes mellitus type 2, hypertension, GERD presenting with left forearm and elbow swelling and pain.  The patient visited the emergency department on 08/23/2024 with left forearm swelling and pain.  X-rays at that time was negative for fracture.  There was a left elbow effusion.  He was discharged in stable condition with Norco and prednisone .  He followed up with orthopedics, Dr. Taft Fox in the office on 08/27/2024.  He was given an additional course of prednisone .  Notably, the patient states that he got stung by a bee on 08/18/2024 in the left wrist first carpometacarpal area.  He stated that that area had improved without any erythema, edema, or pain at the time he visited the emergency department on 08/23/2024.  He continues to state that that area where he had the bee sting had no pain or edema.  Since his follow-up with orthopedics, the patient states that his left forearm and elbow have continued to increase in edema and pain.  As result he presented for further evaluation and treatment.  He has subjective fevers.  He denies any nausea, vomiting or diarrhea, abdominal pain.  He denies any trauma to the left elbow or forearm.  In the ED, the patient was afebrile hemodynamically stable with oxygen saturation 98% room air.  WBC 12.6, hemoglobin 1.8, platelets 409.  Sodium 129, potassium 4.5, bicarbonate 25, serum creatinine 1.05.  CK 44.  Lactic acid 3.1>> 3.1>>1.3.  The patient was started on ceftriaxone  vancomycin .  Assessment and Plan: Left forearm  cellulitis/abscesses - CT left forearm and elbow>> abscess with loculated gas and fluid collection measuring approximately 8.8 x 1.1 x 7.9 cm.  Additional  abscess within the flexor pollicis longus muscle measuring approximately 2.0 x 1.0 x 3.8 cm - Orthopedic consult>>discussed with Francisco Fox - Continue vancomycin  and ceftriaxone  pending final culture data - Venous duplex--neg for DVT - 09/01/24>>I&D left forearm--Francisco Fox cultures - continue oxycodone  for pain - operative culture = GBS>>d/c vanc - WBC 15.4>>13.4 on day of d/c - discussed with Francisco Fox - d/c home with amox/clav x 10 more days  AKI -d/c losartan  -baseline creatinine 0.7-1.0 -serum creatinine peaking 1.30 -due to hemodynamic changes -restart IVF>>improved -serum creatinine 1.07 on day of dc   Lactic acidosis -due to volume depletion and uncontrolled sugars -lactic 3.2>>1.3 with continued IVF -blood cultures remain neg   Diabetes mellitus type 2 - He is on Mounjaro and sitagliptin in the outpatient setting - NovoLog  sliding scale - Continue reduced dose Semglee >>increase to 15 units - increased premeal insulin  to 8 units TID - 08/31/24 A1C--7.5   Essential hypertension - Continue amlodipine   - olmesartan on hold temporarily>>restart after dc   Class I obesity - BMI 34.88 - Lifestyle modification   Mixed hyperlipidemia - Continue statin   BPH - Continue Flomax          Consultants: orthopedics, Francisco Fox Procedures performed: I&D left forearm  Disposition: Home Diet recommendation:  Carb modified diet DISCHARGE MEDICATION: Allergies as of 09/04/2024   No Known Allergies  Medication List     STOP taking these medications    azithromycin 250 MG tablet Commonly known as: ZITHROMAX   celecoxib  200 MG capsule Commonly known as: CELEBREX    HYDROcodone -acetaminophen  5-325 MG tablet Commonly known as: NORCO/VICODIN   Janumet 50-1000 MG tablet Generic drug:  sitaGLIPtin-metformin   losartan  50 MG tablet Commonly known as: COZAAR    naproxen  500 MG tablet Commonly known as: NAPROSYN    predniSONE  10 MG (48) Tbpk tablet Commonly known as: STERAPRED UNI-PAK 48 TAB       TAKE these medications    amLODipine  5 MG tablet Commonly known as: NORVASC  Take 5 mg by mouth daily.   amoxicillin -clavulanate 875-125 MG tablet Commonly known as: AUGMENTIN  Take 1 tablet by mouth every 12 (twelve) hours.   fluticasone  50 MCG/ACT nasal spray Commonly known as: FLONASE  Place 1 spray into both nostrils daily.   levocetirizine 5 MG tablet Commonly known as: XYZAL  Take 5 mg by mouth every evening.   Mounjaro 5 MG/0.5ML Pen Generic drug: tirzepatide SMARTSIG:5 Milligram(s) Once a Week   olmesartan 40 MG tablet Commonly known as: BENICAR Take 40 mg by mouth daily.   omeprazole 40 MG capsule Commonly known as: PRILOSEC Take 40 mg by mouth in the morning and at bedtime.   oxyCODONE  5 MG immediate release tablet Commonly known as: Oxy IR/ROXICODONE  Take 1 tablet (5 mg total) by mouth every 4 (four) hours as needed for severe pain (pain score 7-10).   rosuvastatin  10 MG tablet Commonly known as: CRESTOR  Take 10 mg by mouth at bedtime.   sitaGLIPtin 100 MG tablet Commonly known as: JANUVIA Take 100 mg by mouth 2 (two) times daily.   tamsulosin  0.4 MG Caps capsule Commonly known as: FLOMAX  Take 0.4 mg by mouth in the morning and at bedtime.        Follow-up Information     Francisco Fox LABOR, MD Follow up in 1 week(s).   Specialties: Orthopedic Surgery, Sports Medicine Contact information: [address] [place] [address] [phone]                Discharge Exam: Filed Weights   08/31/24 0925 08/31/24 1340 09/01/24 1200  Weight: 103.4 kg 95.1 kg 95.1 kg   HEENT:  Francisco Fox/AT, No thrush, no icterus CV:  RRR, no rub, no S3, no S4 Lung:  CTA, no wheeze, no rhonchi Abd:  soft/+BS, NT Ext:  left fore arm in bulky dressing.   +edema, compartments soft.  Cap refill < 2 sec.  Radial pulse palpable.  Left elbow without pain with flexion or extension or palpation  Condition at discharge: stable  The results of significant diagnostics from this hospitalization (including imaging, microbiology, ancillary and laboratory) are listed below for reference.   Imaging Studies: US  Venous Img Upper Uni Left (DVT) Result Date: 09/01/2024 CLINICAL DATA:  LEFT arm edema and pain status post bee-sting 3 weeks ago. EXAM: LEFT UPPER EXTREMITY VENOUS DOPPLER ULTRASOUND TECHNIQUE: Gray-scale sonography with graded compression, as well as color Doppler and duplex ultrasound were performed to evaluate the upper extremity deep venous system from the level of the subclavian vein and including the jugular, axillary, basilic, radial, ulnar and upper cephalic vein. Spectral Doppler was utilized to evaluate flow at rest and with distal augmentation maneuvers. COMPARISON:  None available FINDINGS: Contralateral Subclavian Vein: Respiratory phasicity is normal and symmetric with the symptomatic side. No evidence of thrombus. Normal compressibility. Internal Jugular Vein: No evidence of thrombus. Normal compressibility, respiratory phasicity and response to  augmentation. Subclavian Vein: No evidence of thrombus. Normal compressibility, respiratory phasicity and response to augmentation. Axillary Vein: No evidence of thrombus. Normal compressibility, respiratory phasicity and response to augmentation. Cephalic Vein: No evidence of thrombus. Normal compressibility, respiratory phasicity and response to augmentation. Basilic Vein: No evidence of thrombus. Normal compressibility, respiratory phasicity and response to augmentation. Brachial Veins: No evidence of thrombus. Normal compressibility, respiratory phasicity and response to augmentation. Radial Veins: No evidence of thrombus. Normal compressibility, respiratory phasicity and response to augmentation. Ulnar  Veins: No evidence of thrombus. Normal compressibility, respiratory phasicity and response to augmentation. Other Findings:  None visualized. IMPRESSION: No evidence of DVT within the left upper extremity. Electronically Signed   By: Aliene Lloyd M.D.   On: 09/01/2024 13:33   CT FOREARM LEFT W CONTRAST Result Date: 09/01/2024 EXAM: CT LEFT FOREARM, WITH IV CONTRAST TECHNIQUE: Axial images were acquired through the left forearm with IV contrast. Reformatted images were reviewed. Automated exposure control, iterative reconstruction, and/or weight based adjustment of the mA/kV was utilized to reduce the radiation dose to as low as reasonably achievable. COMPARISON: Elbow radiographs 08/31/2024. CLINICAL HISTORY: Left forearm edema, cellulitis--rule out abscess. FINDINGS: BONES AND JOINTS: Small degenerative subcortical cysts or geodes in the scaphoid and lunate. Erosions along the distal humerus trochlea as on images 127 through 141 of series 7. Degenerative arthropathy at the radiocapitellar articulation with loss of articular space and spurring. Small geode on the distal articular margin of the ulna. Small ossicle volar to the hamate, possibly an unfused ossification center along the hamate hook. Degenerative spurring at the first metatarsophalangeal joint potentially with some chronically fragmented spur noted. Moderate elbow joint effusion with enhancing margin suspicious for septic disease. Anterior erosions along the trochlear groove could be related to infection. SOFT TISSUES: Substantial subcutaneous edema dorsal to the distal humerus. Subcutaneous edema tracks along the ulnar and volar forearm down to the wrist. Loculations of gas in a subcutaneous collection anterior to the superficial head of the pronator teres and medial to the flexor carpi radialis along the proximal forearm as on image 344 series 9, with the fluid collection measuring about 8.8 x 1.1 x 7.9 cm (volume = 40 cm\S\3). An abscess in the  flexor pollicis longus muscle measures 2.0 x 1.0 x 3.8 cm (volume = 4 cm\S\3) on image 239 series 9. Small amount of nonspecific fluid signal intensity along the distal biceps tendon on images 323 through 348 of series 9, cannot exclude a small abscess. Substantial heterogeneity in the supinator muscle around the proximal radius for example on image 328 series 9, suspicious for incipient abscess formation. IMPRESSION: 1. Left forearm subcutaneous abscess with loculated gas and fluid collection measuring approximately 8.8 x 1.1 x 7.9 cm (volume  40 cm). 2. Additional intramuscular abscess within the flexor pollicis longus muscle measuring approximately 2.0 x 1.0 x 3.8 cm (volume  4 cm). 3. Moderate elbow joint effusion with enhancing synovium, suspicious for septic arthritis. 4. Findings along the distal biceps tendon sheath and heterogeneous supinator muscle are suspicious for early or incipient abscess formation. 5. Anterior erosions along the trochlear groove may be related to infection. 6. Degenerative arthropathy at the radiocapitellar articulation with loss of articular space and spurring. Electronically signed by: Ryan Salvage MD 09/01/2024 09:40 AM EST RP Workstation: [id number]   DG Elbow Complete Left Result Date: 08/31/2024 EXAM: 3 VIEW(S) XRAY OF THE LEFT ELBOW COMPARISON: Left elbow series dated 08/23/2024. CLINICAL HISTORY: edema FINDINGS: BONES AND JOINTS: No acute fracture. No focal osseous  lesion. No joint dislocation. No joint effusion. SOFT TISSUES: There has been interval development of soft tissue swelling along the dorsum of the elbow and forearm. IMPRESSION: 1. Interval development of soft tissue swelling along the dorsum of the left elbow and forearm. Electronically signed by: Evalene Coho MD 08/31/2024 10:13 AM EST RP Workstation: [id number]   DG Elbow Complete Left Result Date: 08/23/2024 EXAM: 3 VIEW(S) XRAY OF THE LEFT ELBOW COMPARISON: None available. CLINICAL  HISTORY: elbow swelling FINDINGS: BONES AND JOINTS: Normal alignment. No definite fracture. Joint spaces are preserved. Elbow effusion is present. The presence of an elbow effusion suggests a radial occult fracture, and dedicated CT imaging or short-term follow-up radiographs would be helpful to further evaluate this finding. SOFT TISSUES: The soft tissues are unremarkable. IMPRESSION: 1. No definite fracture identified. 2. Left elbow effusion, raising concern for an occult fracture. Dedicated CT imaging or short-term follow-up radiographs would be helpful to further evaluate this finding. Electronically signed by: Dorethia Molt MD 08/23/2024 02:15 AM EST RP Workstation: [id number]    Microbiology: Results for orders placed or performed during the hospital encounter of 08/31/24  Culture, blood (routine x 2)     Status: None (Preliminary result)   Collection Time: 08/31/24  9:56 AM   Specimen: BLOOD  Result Value Ref Range Status   Specimen Description BLOOD RIGHT ANTECUBITAL  Final   Special Requests   Final    Blood Culture adequate volume BOTTLES DRAWN AEROBIC AND ANAEROBIC   Culture   Final    NO GROWTH 4 DAYS Performed at [facility], [address], [place], [place] [address]    Report Status PENDING  Incomplete  Culture, blood (routine x 2)     Status: None (Preliminary result)   Collection Time: 08/31/24  9:58 AM   Specimen: BLOOD  Result Value Ref Range Status   Specimen Description BLOOD BLOOD RIGHT ARM  Final   Special Requests   Final    BOTTLES DRAWN AEROBIC AND ANAEROBIC Blood Culture adequate volume   Culture   Final    NO GROWTH 4 DAYS Performed at [facility], [address] [place], [place] [address]    Report Status PENDING  Incomplete  Aerobic/Anaerobic Culture w Gram Stain (surgical/deep wound)     Status: None (Preliminary result)   Collection Time: 09/01/24 12:51 PM   Specimen: Path fluid; Body Fluid  Result Value Ref Range Status   Specimen Description   Final     FLUID Performed at [facility], [address], [place], [place] [address]    Special Requests LEFT FOREARM ABSC SPEC A  Final   Gram Stain   Final    MODERATE WBC PRESENT,BOTH PMN AND MONONUCLEAR FEW GRAM POSITIVE COCCI Performed at [facility], [address] [place], [place] [address]    Culture   Final    ABUNDANT GROUP B STREP(S.AGALACTIAE)ISOLATED TESTING AGAINST S. AGALACTIAE NOT ROUTINELY PERFORMED DUE TO PREDICTABILITY OF AMP/PEN/VAN SUSCEPTIBILITY. NO ANAEROBES ISOLATED; CULTURE IN PROGRESS FOR 5 DAYS    Report Status PENDING  Incomplete    Labs: CBC: Recent Labs  Lab 08/31/24 0956 09/01/24 0445 09/02/24 0434 09/03/24 0445 09/04/24 0443  WBC 11.6* 12.6* 15.4* 15.3* 13.4*  NEUTROABS 9.4*  --   --   --   --   HGB 12.7* 11.8* 10.8* 10.2* 10.2*  HCT 38.5* 35.9* 32.7* 31.2* 31.1*  MCV 85.9 86.1 86.1 86.0 85.7  PLT 445* 409* 311 270 256   Basic Metabolic Panel: Recent  Labs  Lab 08/31/24 0956 09/01/24 0445 09/02/24 0434 09/03/24 0445 09/04/24 0443  NA 127* 129* 131* 130* 130*  K 4.6 4.5 4.1 4.1 4.1  CL 91* 94* 97* 97* 96*  CO2 22 25 24 23 24   GLUCOSE 371* 220* 227* 227* 166*  BUN 33* 27* 28* 17 14  CREATININE 1.12 1.05 1.30* 1.02 1.07  CALCIUM  9.8 9.3 8.9 8.5* 8.7*  MG  --   --  2.1 2.2  --    Liver Function Tests: Recent Labs  Lab 08/31/24 0956  AST <10*  ALT 9  ALKPHOS 78  BILITOT 0.3  PROT 8.2*  ALBUMIN 4.1   CBG: Recent Labs  Lab 09/03/24 0802 09/03/24 1145 09/03/24 1602 09/03/24 2119 09/04/24 0721  GLUCAP 195* 181* 149* 151* 167*    Discharge time spent: greater than 30 minutes.  Signed: Alm Schneider, MD [facility] 09/04/2024

## 2024-09-05 LAB — CULTURE, BLOOD (ROUTINE X 2)
Culture: NO GROWTH
Culture: NO GROWTH
Special Requests: ADEQUATE
Special Requests: ADEQUATE

## 2024-09-06 LAB — AEROBIC/ANAEROBIC CULTURE W GRAM STAIN (SURGICAL/DEEP WOUND)

## 2024-09-07 ENCOUNTER — Encounter: Payer: Self-pay | Admitting: Orthopedic Surgery

## 2024-09-07 ENCOUNTER — Ambulatory Visit: Admitting: Orthopedic Surgery

## 2024-09-07 VITALS — Ht 65.0 in | Wt 199.0 lb

## 2024-09-07 DIAGNOSIS — L02414 Cutaneous abscess of left upper limb: Secondary | ICD-10-CM

## 2024-09-07 MED ORDER — OXYCODONE HCL 5 MG PO TABS: 5.0000 mg | ORAL_TABLET | Freq: Four times a day (QID) | ORAL | 0 refills | Status: AC | PRN

## 2024-09-07 NOTE — Patient Instructions (Addendum)
 Instructions Following Joint Injections  In clinic today, you received an injection in one of your joints (sometimes more than one).  Occasionally, you can have some pain at the injection site, this is normal.  You can place ice at the injection site, or take over-the-counter medications such as Tylenol  (acetaminophen ) or Advil  (ibuprofen ).  Please follow all directions listed on the bottle.  If your joint (knee or shoulder) becomes swollen, red or very painful, please contact the clinic for additional assistance.   Two medications were injected, including lidocaine  and a steroid (often referred to as cortisone).  Lidocaine  is effective almost immediately but wears off quickly.  However, the steroid can take a few days to improve your symptoms.  In some cases, it can make your pain worse for a couple of days.  Do not be concerned if this happens as it is common.  You can apply ice or take some over-the-counter medications as needed.   Injections in the same joint cannot be repeated for 3 months.  This helps to limit the risk of an infection in the joint.  If you were to develop an infection in your joint, the best treatment option would be surgery.   Change your dressing daily.  Place the yellow dressing (Xeroform) with some gauze and an Ace wrap.  Okay to shower  Finish antibiotics  Pain medicines as needed  Follow-up in 1 week

## 2024-09-07 NOTE — Progress Notes (Signed)
 Orthopaedic Postop Note  Assessment: Francisco Fox is a 58 y.o. male s/p I&D of left forearm; culture positive for S.  Agalactiae  DOS: 09/01/2024  Plan: Francisco Fox is getting better.  I stressed the importance of working on his range of motion.  Some exercises were recommended.  Swelling is improved.  No fevers or chills.  He continues to have some pain, but this is improving.  Continue to take amoxicillin until completion.  Follow-up in 1 week.  Daily dressing changes.  Consider follow-up with PCP to discuss issues related to diabetes.   Meds ordered this encounter  Medications   oxyCODONE  (OXY IR/ROXICODONE ) 5 MG immediate release tablet    Sig: Take 1 tablet (5 mg total) by mouth every 6 (six) hours as needed for severe pain (pain score 7-10).    Dispense:  15 tablet    Refill:  0     Follow-up: Return in about 1 week (around 09/14/2024). XR at next visit: None  Subjective:  Chief Complaint  Patient presents with   Arm Pain    DOS 08/31/24 L/ it is still hurts some but mostly at night.     History of Present Illness: Francisco Fox is a 58 y.o. male who presents following the above stated procedure.  Surgery was approximately 1 week ago.  He is doing well.  He was discharged from the hospital 3 days ago.  He continues to take amoxicillin.  He has not changed his dressing.  He has been using his arm more.  He is working on motion.  No numbness or tingling.  Review of Systems: No fevers or chills No numbness or tingling No Chest Pain No shortness of breath   Objective: Ht 5' 5 (1.651 m)   Wt 199 lb (90.3 kg)   BMI 33.12 kg/m   Physical Exam:  Alert and oriented.  No acute distress  Left forearm with some residual swelling.  No fluctuance.  No warmth.  Small amount of bloody drainage from the mid aspect of the incision.  Sutures are intact.  Fingers are warm well-perfused.  Swelling to the left hand has improved.  Range of motion from 15-95 degrees.  He is  lacking some pronation and supination.  IMAGING: I personally ordered and reviewed the following images:  No new imaging obtained today.  Francisco DELENA Horde, MD 09/07/2024 10:19 AM

## 2024-09-14 ENCOUNTER — Encounter: Payer: Self-pay | Admitting: Orthopedic Surgery

## 2024-09-14 ENCOUNTER — Ambulatory Visit: Admitting: Orthopedic Surgery

## 2024-09-14 DIAGNOSIS — L02414 Cutaneous abscess of left upper limb: Secondary | ICD-10-CM

## 2024-09-14 NOTE — Patient Instructions (Signed)
 Note for work - ok to return 12/15 without restrictions

## 2024-09-14 NOTE — Progress Notes (Signed)
 Orthopaedic Postop Note  Assessment: Francisco Fox is a 58 y.o. male s/p I&D of left forearm; culture positive for S.  Agalactiae  DOS: 09/01/2024  Plan: Francisco Fox is doing much better.  He is almost done with his antibiotics.  Swelling is improving.  He continues to have stiffness of the elbow, and I urged him to continue working on range of motion.  Some exercises were once again described in clinic today.  Keep the incision clean and dry.  Dressing as needed.  Okay for him to return to work next week.  Documentation has been provided.  Follow-up in 2 weeks.   Follow-up: Return in about 2 weeks (around 09/28/2024). XR at next visit: None  Subjective:  Chief Complaint  Patient presents with   Post-op Follow-up    Left forearm/ sutures removed without difficulty  improving    History of Present Illness: Francisco Fox is a 58 y.o. male who presents following the above stated procedure.  Surgery was approximately 2 weeks ago.  He feels better.  Swelling has improved.  He has improved range of motion, but still notes stiffness in the left elbow.  No fevers or chills.  He has a few days remaining of his antibiotics.  Review of Systems: No fevers or chills No numbness or tingling No Chest Pain No shortness of breath   Objective: There were no vitals taken for this visit.  Physical Exam:  Alert and oriented.  No acute distress  Incision in the volar left forearm is healing.  No surrounding erythema or drainage.  Improved swelling to the left forearm.  The forearm is soft.  He does have some diffuse swelling distal to an Ace wrap, which was likely too tight.  Fingers are warm and well-perfused.  He is able to get his hand to his mouth.  Motion is limited to 30-110  Slightly restricted supination   IMAGING: I personally ordered and reviewed the following images:  No new imaging obtained today.  Francisco DELENA Horde, MD 09/14/2024 2:24 PM

## 2024-09-16 ENCOUNTER — Ambulatory Visit: Admitting: Orthopedic Surgery

## 2024-09-28 ENCOUNTER — Encounter: Admitting: Orthopedic Surgery

## 2024-10-05 ENCOUNTER — Encounter: Admitting: Orthopedic Surgery

## 2024-10-06 ENCOUNTER — Ambulatory Visit (INDEPENDENT_AMBULATORY_CARE_PROVIDER_SITE_OTHER): Admitting: Orthopedic Surgery

## 2024-10-06 ENCOUNTER — Encounter: Payer: Self-pay | Admitting: Orthopedic Surgery

## 2024-10-06 DIAGNOSIS — L02414 Cutaneous abscess of left upper limb: Secondary | ICD-10-CM

## 2024-10-06 NOTE — Progress Notes (Signed)
 Orthopaedic Postop Note  Assessment: Francisco Fox is a 58 y.o. male s/p I&D of left forearm; culture positive for S.  Agalactiae  DOS: 09/01/2024  Plan: Francisco Fox is doing well.  He has returned to work.  He is lacking some extension, as well as supination.  This was discussed.  Urged him to continue working on his range of motion.  He has no restrictions.  Medicines as needed.  He will return to clinic as needed.   Follow-up: Return if symptoms worsen or fail to improve. XR at next visit: None  Subjective:  Chief Complaint  Patient presents with   Routine Post Op    L forearm DOS: 09/01/2024    History of Present Illness: Francisco Fox is a 58 y.o. male who presents following the above stated procedure.  Surgery was approximately 1 month ago.  He is doing much better.  He has returned to work.  He has no fevers or chills.  No pain.  No numbness or tingling.  His motion is much better.  He feels better.  Review of Systems: No fevers or chills No numbness or tingling No Chest Pain No shortness of breath   Objective: There were no vitals taken for this visit.  Physical Exam:  Alert and oriented.  No acute distress  Incision over the medial and volar forearm is healed.  No surrounding erythema or drainage.  Mild tenderness in this area.  No fluctuance.  No swelling in the forearm or elbow.  Range of motion from 15-130.  He is lacking some supination.  Fingers are warm and well-perfused.  Sensation intact throughout the left hand.   IMAGING: I personally ordered and reviewed the following images:  No new imaging obtained today.  Francisco DELENA Horde, MD 10/06/2024 9:20 AM
